# Patient Record
Sex: Female | Born: 1945 | Race: White | Hispanic: No | Marital: Single | Smoking: Never smoker
Health system: Southern US, Community
[De-identification: ages and names within clinical notes are randomized; demographics above are authoritative.]

## PROBLEM LIST (undated history)

## (undated) DIAGNOSIS — F0390 Unspecified dementia without behavioral disturbance: Secondary | ICD-10-CM

## (undated) DIAGNOSIS — I10 Essential (primary) hypertension: Secondary | ICD-10-CM

## (undated) HISTORY — PX: NO PAST SURGERIES: SHX2092

---

## 2009-08-15 ENCOUNTER — Emergency Department (HOSPITAL_COMMUNITY): Admission: EM | Admit: 2009-08-15 | Discharge: 2009-08-15 | Payer: Self-pay | Admitting: Emergency Medicine

## 2010-07-20 LAB — CBC
Hemoglobin: 14.3 g/dL (ref 12.0–15.0)
MCHC: 34.3 g/dL (ref 30.0–36.0)
MCV: 89.1 fL (ref 78.0–100.0)
RBC: 4.67 MIL/uL (ref 3.87–5.11)
RDW: 14.2 % (ref 11.5–15.5)
WBC: 6.5 10*3/uL (ref 4.0–10.5)

## 2010-07-20 LAB — DIFFERENTIAL
Basophils Absolute: 0.1 10*3/uL (ref 0.0–0.1)
Basophils Relative: 1 % (ref 0–1)
Eosinophils Absolute: 0.3 10*3/uL (ref 0.0–0.7)
Lymphocytes Relative: 29 % (ref 12–46)
Monocytes Absolute: 0.6 10*3/uL (ref 0.1–1.0)
Neutrophils Relative %: 56 % (ref 43–77)

## 2010-07-20 LAB — BASIC METABOLIC PANEL
CO2: 26 mEq/L (ref 19–32)
GFR calc Af Amer: >60 mL/min (ref >60)
GFR calc non Af Amer: >60 mL/min (ref >60)
Glucose, Bld: 90 mg/dL (ref 70–99)
Sodium: 137 mEq/L (ref 135–145)

## 2010-07-20 LAB — CK: Total CK: 143 U/L (ref 7–177)

## 2010-07-20 LAB — D-DIMER, QUANTITATIVE: D-Dimer, Quant: 0.44 ug/mL-FEU (ref 0.00–0.48)

## 2013-01-14 ENCOUNTER — Encounter (HOSPITAL_COMMUNITY): Payer: Self-pay | Admitting: Emergency Medicine

## 2013-01-14 DIAGNOSIS — R0609 Other forms of dyspnea: Secondary | ICD-10-CM | POA: Diagnosis not present

## 2013-01-14 DIAGNOSIS — R61 Generalized hyperhidrosis: Secondary | ICD-10-CM | POA: Diagnosis not present

## 2013-01-14 DIAGNOSIS — R209 Unspecified disturbances of skin sensation: Secondary | ICD-10-CM | POA: Insufficient documentation

## 2013-01-14 DIAGNOSIS — R0989 Other specified symptoms and signs involving the circulatory and respiratory systems: Secondary | ICD-10-CM | POA: Insufficient documentation

## 2013-01-14 NOTE — ED Notes (Signed)
Pt c/o left arm and neck tingling x 2-3 days.

## 2013-01-15 ENCOUNTER — Emergency Department (HOSPITAL_COMMUNITY)
Admission: EM | Admit: 2013-01-15 | Discharge: 2013-01-15 | Disposition: A | Discharge status: Home / Self Care | Payer: Medicare Other | Attending: Emergency Medicine | Admitting: Emergency Medicine

## 2013-01-15 ENCOUNTER — Emergency Department (HOSPITAL_COMMUNITY): Payer: Medicare Other

## 2013-01-15 DIAGNOSIS — R200 Anesthesia of skin: Secondary | ICD-10-CM

## 2013-01-15 DIAGNOSIS — R209 Unspecified disturbances of skin sensation: Secondary | ICD-10-CM | POA: Diagnosis not present

## 2013-01-15 LAB — CBC WITH DIFFERENTIAL/PLATELET
Basophils Relative: 1 % (ref 0–1)
Eosinophils Absolute: 0.4 10*3/uL (ref 0.0–0.7)
HCT: 44.6 % (ref 36.0–46.0)
Lymphocytes Relative: 30 % (ref 12–46)
MCHC: 33.6 g/dL (ref 30.0–36.0)
Neutro Abs: 4.4 10*3/uL (ref 1.7–7.7)
Neutrophils Relative %: 55 % (ref 43–77)
Platelets: 272 10*3/uL (ref 150–400)

## 2013-01-15 LAB — BASIC METABOLIC PANEL
CO2: 25 mEq/L (ref 19–32)
Chloride: 102 mEq/L (ref 96–112)
Creatinine, Ser: 0.87 mg/dL (ref 0.50–1.10)
GFR calc Af Amer: 79 mL/min — ABNORMAL LOW (ref >90)
Glucose, Bld: 110 mg/dL — ABNORMAL HIGH (ref 70–99)
Sodium: 139 mEq/L (ref 135–145)

## 2013-01-15 LAB — TROPONIN I
Troponin I: <0.3 ng/mL (ref <0.30)
Troponin I: <0.3 ng/mL (ref <0.30)

## 2013-01-15 MED ORDER — ASPIRIN 81 MG PO CHEW
324.0000 mg | CHEWABLE_TABLET | Freq: Once | ORAL | Status: AC
  Administered 2013-01-15: 324 mg via ORAL
  Filled 2013-01-15: qty 4

## 2013-01-15 MED ORDER — NITROGLYCERIN 0.4 MG SL SUBL: 0.4000 mg | SUBLINGUAL_TABLET | SUBLINGUAL | Status: DC | PRN

## 2013-01-15 NOTE — ED Provider Notes (Signed)
CSN: [id number]     Arrival date & time 01/14/13  2305 History   First MD Initiated Contact with Patient 01/15/13 0228     Chief Complaint  Patient presents with  . Tingling   (Consider location/radiation/quality/duration/timing/severity/associated sxs/prior Treatment) The history is provided by the patient.   67 year old female has had 3 episodes of tingling in her arms and neck with associated dyspnea and diaphoresis. The first 2 episodes woke her up during the night last night and the night before. The third episode occurred while she was at work but was sitting and not physically active. All episodes lasted 10 minutes or less. There has been some very mild residual numbness in her hands but all other symptoms resolved completely. The episode this afternoon seemed to resolve after she took a dose of aspirin. When present, she did not notice anything that seemed to make it worse and nothing seemed to make it better other than perhaps the aspirin. She denies nausea or vomiting. There is some mild tightness or pressure feeling in her left anterior chest at the same time that she was having these symptoms. She is a nonsmoker and denies history of hypertension or diabetes or hyperlipidemia. There is a positive family history of coronary artery disease in her father had MIs starting in his 28s.  History reviewed. No pertinent past medical history. History reviewed. No pertinent past surgical history. History reviewed. No pertinent family history. History  Substance Use Topics  . Smoking status: Never Smoker   . Smokeless tobacco: Not on file  . Alcohol Use: No   OB History   Grav Para Term Preterm Abortions TAB SAB Ect Mult Living                 Review of Systems  All other systems reviewed and are negative.    Allergies  Review of patient's allergies indicates no known allergies.  Home Medications  No current outpatient prescriptions on file. BP 159/72  Pulse 65  Temp(Src) 97.8 F  (36.6 C)  Resp 21  Ht 5' 1.5" (1.562 m)  Wt 140 lb (63.504 kg)  BMI 26.03 kg/m2  SpO2 98% Physical Exam  Nursing note and vitals reviewed.  67 year old female, resting comfortably and in no acute distress. Vital signs are significant for hypertension with blood pressure 159/72, and mild tachypnea with respiratory rate of 21. Oxygen saturation is 98%, which is normal. Head is normocephalic and atraumatic. PERRLA, EOMI. Oropharynx is clear. Neck is nontender and supple without adenopathy or JVD. Back is nontender and there is no CVA tenderness. Lungs are clear without rales, wheezes, or rhonchi. Chest is nontender. Heart has regular rate and rhythm without murmur. Abdomen is soft, flat, nontender without masses or hepatosplenomegaly and peristalsis is normoactive. Extremities have no cyanosis or edema, full range of motion is present. Skin is warm and dry without rash. Neurologic: Mental status is normal, cranial nerves are intact, there are no motor or sensory deficits. There is no pronator drift and no extinction on double simultaneous stimulation.  ED Course  Procedures (including critical care time) Labs Review Results for orders placed during the hospital encounter of 01/15/13  CBC WITH DIFFERENTIAL      Result Value Range   WBC 7.9  4.0 - 10.5 K/uL   RBC 4.99  3.87 - 5.11 MIL/uL   Hemoglobin 15.0  12.0 - 15.0 g/dL   HCT 13.0  86.5 - 78.4 %   MCV 89.4  78.0 - 100.0 fL  MCH 30.1  26.0 - 34.0 pg   MCHC 33.6  30.0 - 36.0 g/dL   RDW 86.5  78.4 - 69.6 %   Platelets 272  150 - 400 K/uL   Neutrophils Relative % 55  43 - 77 %   Neutro Abs 4.4  1.7 - 7.7 K/uL   Lymphocytes Relative 30  12 - 46 %   Lymphs Abs 2.4  0.7 - 4.0 K/uL   Monocytes Relative 9  3 - 12 %   Monocytes Absolute 0.7  0.1 - 1.0 K/uL   Eosinophils Relative 5  0 - 5 %   Eosinophils Absolute 0.4  0.0 - 0.7 K/uL   Basophils Relative 1  0 - 1 %   Basophils Absolute 0.1  0.0 - 0.1 K/uL  BASIC METABOLIC PANEL       Result Value Range   Sodium 139  135 - 145 mEq/L   Potassium 4.0  3.5 - 5.1 mEq/L   Chloride 102  96 - 112 mEq/L   CO2 25  19 - 32 mEq/L   Glucose, Bld 110 (*) 70 - 99 mg/dL   BUN 14  6 - 23 mg/dL   Creatinine, Ser 2.95  0.50 - 1.10 mg/dL   Calcium 28.4 (*) 8.4 - 10.5 mg/dL   GFR calc non Af Amer 68 (*) >90 mL/min   GFR calc Af Amer 79 (*) >90 mL/min  TROPONIN I      Result Value Range   Troponin I <0.30  <0.30 ng/mL  TROPONIN I      Result Value Range   Troponin I <0.30  <0.30 ng/mL   Dg Chest 2 View  01/15/2013   *RADIOLOGY REPORT*  Clinical Data: Tingling  CHEST - 2 VIEW  Comparison: Prior radiograph 08/15/2009  Findings: Cardiac and mediastinal silhouettes are stable in size and contour.  The transverse heart size is at the upper limits of normal.  Lungs are normally inflated.  No focal airspace consolidation, pleural effusion, or pulmonary edema is identified.  There is no pneumothorax.  Multilevel degenerative change and no within the visualized spine. No acute osseous abnormality.  Prominent atherosclerotic calcifications noted within the intra-abdominal aorta.  IMPRESSION: No active cardiopulmonary process.   Original Report Authenticated By: Rise Mu, M.D.    Imaging Review Dg Chest 2 View  01/15/2013   *RADIOLOGY REPORT*  Clinical Data: Tingling  CHEST - 2 VIEW  Comparison: Prior radiograph 08/15/2009  Findings: Cardiac and mediastinal silhouettes are stable in size and contour.  The transverse heart size is at the upper limits of normal.  Lungs are normally inflated.  No focal airspace consolidation, pleural effusion, or pulmonary edema is identified.  There is no pneumothorax.  Multilevel degenerative change and no within the visualized spine. No acute osseous abnormality.  Prominent atherosclerotic calcifications noted within the intra-abdominal aorta.  IMPRESSION: No active cardiopulmonary process.   Original Report Authenticated By: Rise Mu, M.D.      Date: 01/15/2013  Rate: 60  Rhythm: normal sinus rhythm  QRS Axis: normal  Intervals: normal  ST/T Wave abnormalities: normal  Conduction Disutrbances:none  Narrative Interpretation: Left ventricular hypertrophy. When compared with ECG of 08/15/2009, no significant changes are seen.  Old EKG Reviewed: unchanged   MDM   1. Arm numbness    Arm numbness which may be an angina equivalent. Cardiac evaluation will be initiated. There no objective neurologic signs. She is started on aspirin and will likely need some kind of provocative stress testing  to further evaluate her symptoms. Old records are reviewed and there no relevant past visits.  She's remained pain-free in the ED. 2 troponin tests are negative. She will need some form of stress testing, but this can be arranged as an outpatient. She is discharged with instructions to take aspirin daily and prescription for nitroglycerin and she is referred to [facility].  Dione Booze, MD 01/15/13 [phone]

## 2013-01-20 ENCOUNTER — Emergency Department (HOSPITAL_COMMUNITY): Payer: Medicare Other

## 2013-01-20 ENCOUNTER — Emergency Department (HOSPITAL_COMMUNITY)
Admission: EM | Admit: 2013-01-20 | Discharge: 2013-01-20 | Disposition: A | Discharge status: Home / Self Care | Payer: Medicare Other | Attending: Emergency Medicine | Admitting: Emergency Medicine

## 2013-01-20 ENCOUNTER — Encounter (HOSPITAL_COMMUNITY): Payer: Self-pay | Admitting: *Deleted

## 2013-01-20 DIAGNOSIS — Z7982 Long term (current) use of aspirin: Secondary | ICD-10-CM | POA: Diagnosis not present

## 2013-01-20 DIAGNOSIS — R072 Precordial pain: Secondary | ICD-10-CM | POA: Diagnosis present

## 2013-01-20 DIAGNOSIS — R079 Chest pain, unspecified: Secondary | ICD-10-CM

## 2013-01-20 LAB — CBC WITH DIFFERENTIAL/PLATELET
Basophils Absolute: 0 10*3/uL (ref 0.0–0.1)
Lymphocytes Relative: 28 % (ref 12–46)
Lymphs Abs: 2.3 10*3/uL (ref 0.7–4.0)
MCH: 29.8 pg (ref 26.0–34.0)
MCHC: 33.4 g/dL (ref 30.0–36.0)
Neutro Abs: 4.5 10*3/uL (ref 1.7–7.7)
RDW: 13.5 % (ref 11.5–15.5)
WBC: 8.3 10*3/uL (ref 4.0–10.5)

## 2013-01-20 LAB — URINALYSIS, ROUTINE W REFLEX MICROSCOPIC
Glucose, UA: NEGATIVE mg/dL
Ketones, ur: NEGATIVE mg/dL
Nitrite: NEGATIVE
Specific Gravity, Urine: <1.005 — ABNORMAL LOW (ref 1.005–1.030)
pH: 6 (ref 5.0–8.0)

## 2013-01-20 LAB — COMPREHENSIVE METABOLIC PANEL
ALT: 15 U/L (ref 0–35)
AST: 15 U/L (ref 0–37)
Albumin: 3.7 g/dL (ref 3.5–5.2)
BUN: 17 mg/dL (ref 6–23)
Calcium: 9.8 mg/dL (ref 8.4–10.5)
Chloride: 103 mEq/L (ref 96–112)
GFR calc non Af Amer: 51 mL/min — ABNORMAL LOW (ref >90)
Glucose, Bld: 101 mg/dL — ABNORMAL HIGH (ref 70–99)
Total Protein: 7.3 g/dL (ref 6.0–8.3)

## 2013-01-20 LAB — URINE MICROSCOPIC-ADD ON

## 2013-01-20 LAB — D-DIMER, QUANTITATIVE: D-Dimer, Quant: 0.41 ug/mL-FEU (ref 0.00–0.48)

## 2013-01-20 MED ORDER — SODIUM CHLORIDE 0.9 % IV BOLUS (SEPSIS)
1000.0000 mL | Freq: Once | INTRAVENOUS | Status: AC
  Administered 2013-01-20: 1000 mL via INTRAVENOUS

## 2013-01-20 NOTE — ED Notes (Signed)
Pt c/o intermittent centralized chest pain along with SOB and tingling in left arm since Monday. Pt was seen here Monday and diagnosed with angina. Pt states symptoms have "come and gone" throughout the week but today is the worst she's felt since symptoms began Monday.

## 2013-01-20 NOTE — ED Notes (Signed)
Pt alert & oriented x4, stable gait. Patient given discharge instructions, paperwork & prescription(s). Patient  instructed to stop at the registration desk to finish any additional paperwork. Patient verbalized understanding. Pt left department w/ no further questions. 

## 2013-01-20 NOTE — ED Provider Notes (Signed)
CSN: [id number]     Arrival date & time 01/20/13  1607 History   First MD Initiated Contact with Patient 01/20/13 1637     Chief Complaint  Patient presents with  . Chest Pain   (Consider location/radiation/quality/duration/timing/severity/associated sxs/prior Treatment) Patient is a 67 y.o. female presenting with chest pain. The history is provided by the patient (the pt complains of some chest pressure today).  Chest Pain Pain location:  Substernal area Pain quality: aching   Pain radiates to:  Does not radiate Pain radiates to the back: no   Pain severity:  Moderate Onset quality:  Sudden Timing:  Intermittent Progression:  Resolved Chronicity:  Recurrent Context: breathing   Associated symptoms: no abdominal pain, no back pain, no cough, no fatigue and no headache     History reviewed. No pertinent past medical history. History reviewed. No pertinent past surgical history. No family history on file. History  Substance Use Topics  . Smoking status: Never Smoker   . Smokeless tobacco: Not on file  . Alcohol Use: No   OB History   Grav Para Term Preterm Abortions TAB SAB Ect Mult Living                 Review of Systems  Constitutional: Negative for appetite change and fatigue.  HENT: Negative for congestion, sinus pressure and ear discharge.   Eyes: Negative for discharge.  Respiratory: Negative for cough.   Cardiovascular: Positive for chest pain.  Gastrointestinal: Negative for abdominal pain and diarrhea.  Genitourinary: Negative for frequency and hematuria.  Musculoskeletal: Negative for back pain.  Skin: Negative for rash.  Neurological: Negative for seizures and headaches.  Psychiatric/Behavioral: Negative for hallucinations.    Allergies  Review of patient's allergies indicates no known allergies.  Home Medications   Current Outpatient Rx  Name  Route  Sig  Dispense  Refill  . aspirin EC 81 MG tablet   Oral   Take 81 mg by mouth daily.          . nitroGLYCERIN (NITROSTAT) 0.4 MG SL tablet   Sublingual   Place 1 tablet (0.4 mg total) under the tongue every 5 (five) minutes as needed for chest pain.   30 tablet   0    BP 139/99  Pulse 74  Temp(Src) 98.1 F (36.7 C) (Oral)  Resp 19  Ht 5' 1.5" (1.562 m)  Wt 140 lb (63.504 kg)  BMI 26.03 kg/m2  SpO2 96% Physical Exam  Constitutional: She is oriented to person, place, and time. She appears well-developed.  HENT:  Head: Normocephalic.  Eyes: Conjunctivae and EOM are normal. No scleral icterus.  Neck: Neck supple. No thyromegaly present.  Cardiovascular: Normal rate and regular rhythm.  Exam reveals no gallop and no friction rub.   No murmur heard. Pulmonary/Chest: No stridor. She has no wheezes. She has no rales. She exhibits no tenderness.  Abdominal: She exhibits no distension. There is no tenderness. There is no rebound.  Musculoskeletal: Normal range of motion. She exhibits no edema.  Lymphadenopathy:    She has no cervical adenopathy.  Neurological: She is oriented to person, place, and time. Coordination normal.  Skin: No rash noted. No erythema.  Psychiatric: She has a normal mood and affect. Her behavior is normal.    ED Course  Procedures (including critical care time) Labs Review Labs Reviewed  CBC WITH DIFFERENTIAL - Abnormal; Notable for the following:    Eosinophils Relative 8 (*)    All other components within normal  limits  COMPREHENSIVE METABOLIC PANEL - Abnormal; Notable for the following:    Glucose, Bld 101 (*)    Creatinine, Ser 1.11 (*)    GFR calc non Af Amer 51 (*)    GFR calc Af Amer 59 (*)    All other components within normal limits  URINALYSIS, ROUTINE W REFLEX MICROSCOPIC - Abnormal; Notable for the following:    Specific Gravity, Urine <1.005 (*)    Leukocytes, UA SMALL (*)    All other components within normal limits  URINE MICROSCOPIC-ADD ON - Abnormal; Notable for the following:    Squamous Epithelial / LPF MANY (*)     Bacteria, UA MANY (*)    All other components within normal limits  TROPONIN I  D-DIMER, QUANTITATIVE  TROPONIN I   Imaging Review Dg Chest 2 View  01/20/2013   CLINICAL DATA:  Chest pain and shortness of breath  EXAM: CHEST  2 VIEW  COMPARISON:  January 15, 2013  FINDINGS: There is no edema or consolidation. Heart is upper normal in size with normal pulmonary vascularity. No adenopathy. No bone lesions. There is atherosclerotic change in the aorta. There is degenerative change in the thoracic spine.  IMPRESSION: No edema or consolidation.   Electronically Signed   By: Bretta Bang   On: 01/20/2013 17:22    Date: 01/20/2013  Rate: 68  Rhythm: normal sinus rhythm  QRS Axis: normal  Intervals: normal  ST/T Wave abnormalities: normal  Conduction Disutrbances:none  Narrative Interpretation:   Old EKG Reviewed: none available   MDM   1. Chest pain        Benny Lennert, MD 01/20/13 2059

## 2013-01-20 NOTE — ED Notes (Signed)
Pt c/o left side chest feeling "funny" with radiation to left neck area and left arm. Funny feeling is associated with sob, dizziness, was seen in er earlier in the week for same type symptoms, has appointment with cardiologist Wednesday.

## 2013-01-23 ENCOUNTER — Encounter: Payer: Self-pay | Admitting: Cardiology

## 2013-01-23 ENCOUNTER — Encounter: Payer: Self-pay | Admitting: *Deleted

## 2013-01-23 ENCOUNTER — Ambulatory Visit (INDEPENDENT_AMBULATORY_CARE_PROVIDER_SITE_OTHER): Payer: Medicare Other | Admitting: Cardiology

## 2013-01-23 VITALS — BP 176/93 | HR 76 | Ht 61.5 in | Wt 150.0 lb

## 2013-01-23 DIAGNOSIS — R739 Hyperglycemia, unspecified: Secondary | ICD-10-CM

## 2013-01-23 DIAGNOSIS — R079 Chest pain, unspecified: Secondary | ICD-10-CM

## 2013-01-23 DIAGNOSIS — R7309 Other abnormal glucose: Secondary | ICD-10-CM

## 2013-01-23 DIAGNOSIS — E782 Mixed hyperlipidemia: Secondary | ICD-10-CM

## 2013-01-23 NOTE — Patient Instructions (Addendum)
Your physician recommends that you schedule a follow-up appointment in: 3 WEEKS  Your physician recommends that you return for lab work in: THIS WEEK, YOU DO NEED TO BE FASTING, NOTHING TO EAT OR DRINK AFTER MIDNIGHT EXCEPT FOR A FEW SIPS OF WATER WITH YOUR MEDICATIONS (FLP,A1C)  Your physician has requested that you have an echocardiogram. Echocardiography is a painless test that uses sound waves to create images of your heart. It provides your doctor with information about the size and shape of your heart and how well your heart's chambers and valves are working. This procedure takes approximately one hour. There are no restrictions for this procedure.  Your physician has requested that you have a stress echocardiogram. For further information please visit [email or web address] Please follow instruction sheet as given.  WE WILL CALL YOU WITH YOUR TEST RESULTS/INSTRUCTIONS/NEXT STEPS ONCE RECEIVED BY THE PROVIDER

## 2013-01-23 NOTE — Progress Notes (Signed)
   Clinical Summary Charlotte Lane is a 67 y.o.female  1. Dyspnea - seen in ER 01/15/13 w/ episodes of dyspnea, diaphoresis, and tingling down arms and neck. Mild chest pressure. In ER no ischemic EKG changes, troponins negative. Came back to ER 01/20/13 w/ same symptoms. Troponins once again negative  - episodes started earlier this month. Tightness in chest 3/10 in midchest. + fatigue, some nausea, some dizziness. + SOB. W/ some tingling in both arm and hands. Episodes last approx 5 minutes. Can be worst w/ position. Can occur at rest or with exertion. Notes some increased DOE w/ her daily activiities, though she is fairly sedentary. Uses 3 pillows at home for GERD. Occas LE edema, no PND. Notes increasing in frequency over the last 2 weeks    PMH No diagnosed medical problems, but has not seen doctor in 8 years  No Known Allergies   Current Outpatient Prescriptions  Medication Sig Dispense Refill  . aspirin EC 81 MG tablet Take 81 mg by mouth daily.      . nitroGLYCERIN (NITROSTAT) 0.4 MG SL tablet Place 1 tablet (0.4 mg total) under the tongue every 5 (five) minutes as needed for chest pain.  30 tablet  0   No current facility-administered medications for this visit.     No past surgical history on file.   No Known Allergies    Family History Father w/ "heart troubles", unsure what type and age.   Social History Ms. Pottenger reports that she has never smoked. She does not have any smokeless tobacco history on file. Ms. Westry reports that she does not drink alcohol.   Review of Systems 12 point ROS negative other than reported in HPI  Physical Examination p 76 bp 176/93 Gen: resting comfortably, NAD HEENT: no scleral icterus, pupils equal round and reactive, no palptable cervical adenopathy CV: RRR, no m/r/g, no JVD, no carotid bruit Pulm: CTAB Abd: soft, NT, ND NABS, no hepatosplenomegaly Ext: warm, no edema.  Skin: warm, no rash Neuro: A&Ox3, no focal  deficits    Diagnostic Studies 01/20/13 CXR FINDINGS: There is no edema or consolidation. Heart is upper normal in size with normal pulmonary vascularity. No adenopathy. No bone lesions. There is atherosclerotic change in the aorta. There is degenerative change in the thoracic spine.  IMPRESSION: No edema or consolidation.    Pertinent labs 01/20/13: Na 139 K 4.0 Cl 102 BUN 14 Cr 0.87 Ca 10.6 Hgb 15 Hct 44.6 Plt 272 D-Dimer 0.41 (neg)  Assessment and Plan  1. Chest pain: mixed symptoms, some elements are concerning for possible angina. She has not seen doctors in several years, so all of her CAD risk factors are not well described. - obtain echocardiogram - obtain stress echo - check llipid panel, HgbA1c  2. Elevated bp - elevated bp in clinic, patient has not routinely had a health care provider, I suspect she does have HTN - will f/u bp at next visit, if continues to be elevated will initiate therapy   We also provided her a list of PCPs in the area.   Antoine Poche, M.D., F.A.C.C.

## 2013-01-25 LAB — LIPID PANEL
Cholesterol: 197 mg/dL (ref 0–200)
LDL Cholesterol: 131 mg/dL — ABNORMAL HIGH (ref 0–99)
Triglycerides: 102 mg/dL (ref <150)
VLDL: 20 mg/dL (ref 0–40)

## 2013-01-25 LAB — HEMOGLOBIN A1C: Hgb A1c MFr Bld: 6.4 % — ABNORMAL HIGH (ref <5.7)

## 2013-02-01 ENCOUNTER — Encounter: Payer: Self-pay | Admitting: *Deleted

## 2013-02-07 ENCOUNTER — Ambulatory Visit (HOSPITAL_COMMUNITY)
Admission: RE | Admit: 2013-02-07 | Discharge: 2013-02-07 | Disposition: A | Discharge status: Home / Self Care | Payer: Medicare Other | Source: Ambulatory Visit | Attending: Cardiology | Admitting: Cardiology

## 2013-02-07 ENCOUNTER — Encounter (HOSPITAL_COMMUNITY): Payer: Self-pay

## 2013-02-07 DIAGNOSIS — E782 Mixed hyperlipidemia: Secondary | ICD-10-CM

## 2013-02-07 DIAGNOSIS — R739 Hyperglycemia, unspecified: Secondary | ICD-10-CM

## 2013-02-07 DIAGNOSIS — I517 Cardiomegaly: Secondary | ICD-10-CM

## 2013-02-07 DIAGNOSIS — R079 Chest pain, unspecified: Secondary | ICD-10-CM | POA: Insufficient documentation

## 2013-02-07 DIAGNOSIS — R0989 Other specified symptoms and signs involving the circulatory and respiratory systems: Secondary | ICD-10-CM | POA: Insufficient documentation

## 2013-02-07 DIAGNOSIS — R072 Precordial pain: Secondary | ICD-10-CM

## 2013-02-07 DIAGNOSIS — R0609 Other forms of dyspnea: Secondary | ICD-10-CM | POA: Insufficient documentation

## 2013-02-07 NOTE — Progress Notes (Signed)
[facility]   2D echo completed 02/07/2013.   Cindy Zeric Baranowski, RDCS  

## 2013-02-07 NOTE — Progress Notes (Signed)
2 D stress echocardiogram completed. Veda Canning, RDCS

## 2013-02-07 NOTE — Progress Notes (Signed)
Stress Lab Nurses Notes - Charlotte Lane  Charlotte Lane 02/07/2013 Reason for doing test: Chest Pain Type of test: Stress Echo Nurse performing test: Parke Poisson, RN Nuclear Medicine Tech: Not Applicable Echo Tech: Veda Canning MD performing test: Dr. Wyline Mood / Joni Reining NP Family MD: NPCP Test explained and consent signed: yes IV started: No IV started Symptoms: SOB Treatment/Intervention: None Reason test stopped: reached target HR After recovery IV was: NA Patient to return to Nuc. Med at : NA Patient discharged: Home Patient's Condition upon discharge was: stable Comments: During test peak BP 175/41 & HR 157.  Recovery BP 163/78 & HR 82.  Symptoms resolved in recovery. Erskine Speed T

## 2013-03-06 ENCOUNTER — Ambulatory Visit: Payer: Medicare Other | Admitting: Cardiology

## 2013-05-03 ENCOUNTER — Encounter: Payer: Self-pay | Admitting: *Deleted

## 2015-06-24 ENCOUNTER — Encounter (HOSPITAL_COMMUNITY): Payer: Self-pay | Admitting: *Deleted

## 2015-06-24 ENCOUNTER — Emergency Department (HOSPITAL_COMMUNITY): Payer: Medicare Other

## 2015-06-24 DIAGNOSIS — Z8249 Family history of ischemic heart disease and other diseases of the circulatory system: Secondary | ICD-10-CM | POA: Insufficient documentation

## 2015-06-24 DIAGNOSIS — R079 Chest pain, unspecified: Principal | ICD-10-CM | POA: Insufficient documentation

## 2015-06-24 DIAGNOSIS — I10 Essential (primary) hypertension: Secondary | ICD-10-CM | POA: Diagnosis not present

## 2015-06-24 LAB — BASIC METABOLIC PANEL
Anion gap: 10 (ref 5–15)
BUN: 14 mg/dL (ref 6–20)
CHLORIDE: 107 mmol/L (ref 101–111)
CO2: 26 mmol/L (ref 22–32)
CREATININE: 1.35 mg/dL — AB (ref 0.44–1.00)
Calcium: 9.3 mg/dL (ref 8.9–10.3)
GFR calc Af Amer: 45 mL/min — ABNORMAL LOW (ref >60)
GFR, EST NON AFRICAN AMERICAN: 39 mL/min — AB (ref >60)
Glucose, Bld: 118 mg/dL — ABNORMAL HIGH (ref 65–99)
Potassium: 4.2 mmol/L (ref 3.5–5.1)
SODIUM: 143 mmol/L (ref 135–145)

## 2015-06-24 LAB — CBC
HEMATOCRIT: 41.6 % (ref 36.0–46.0)
HEMOGLOBIN: 13.3 g/dL (ref 12.0–15.0)
MCH: 28.9 pg (ref 26.0–34.0)
MCHC: 32 g/dL (ref 30.0–36.0)
MCV: 90.4 fL (ref 78.0–100.0)
Platelets: 291 10*3/uL (ref 150–400)
RBC: 4.6 MIL/uL (ref 3.87–5.11)
RDW: 13.9 % (ref 11.5–15.5)
WBC: 8.1 10*3/uL (ref 4.0–10.5)

## 2015-06-24 NOTE — ED Notes (Signed)
Pt c/o int chest pain x 3 days. Radiates to neck.

## 2015-06-25 ENCOUNTER — Observation Stay (HOSPITAL_COMMUNITY)
Admission: EM | Admit: 2015-06-25 | Discharge: 2015-06-25 | Disposition: A | Discharge status: Home / Self Care | Payer: Medicare Other | Attending: Internal Medicine | Admitting: Internal Medicine

## 2015-06-25 ENCOUNTER — Encounter (HOSPITAL_COMMUNITY): Payer: Self-pay | Admitting: Physician Assistant

## 2015-06-25 ENCOUNTER — Observation Stay (HOSPITAL_COMMUNITY): Payer: Medicare Other

## 2015-06-25 DIAGNOSIS — R072 Precordial pain: Secondary | ICD-10-CM | POA: Diagnosis not present

## 2015-06-25 DIAGNOSIS — I10 Essential (primary) hypertension: Secondary | ICD-10-CM | POA: Diagnosis present

## 2015-06-25 DIAGNOSIS — R079 Chest pain, unspecified: Secondary | ICD-10-CM | POA: Diagnosis not present

## 2015-06-25 HISTORY — DX: Essential (primary) hypertension: I10

## 2015-06-25 LAB — LIPID PANEL
Cholesterol: 252 mg/dL — ABNORMAL HIGH (ref 0–200)
HDL: 48 mg/dL (ref >40)
LDL CALC: 189 mg/dL — AB (ref 0–99)
Total CHOL/HDL Ratio: 5.3 RATIO
Triglycerides: 75 mg/dL (ref <150)
VLDL: 15 mg/dL (ref 0–40)

## 2015-06-25 LAB — NM MYOCAR MULTI W/SPECT W/WALL MOTION / EF
CHL CUP RESTING HR STRESS: 75 {beats}/min
CSEPPHR: 86 {beats}/min
MPHR: 151 {beats}/min
Percent HR: 56 %

## 2015-06-25 LAB — TROPONIN I: Troponin I: <0.03 ng/mL (ref <0.031)

## 2015-06-25 MED ORDER — ASPIRIN 81 MG PO CHEW: 81.0000 mg | CHEWABLE_TABLET | Freq: Every day | ORAL | Status: DC

## 2015-06-25 MED ORDER — ASPIRIN 325 MG PO TABS
325.0000 mg | ORAL_TABLET | Freq: Once | ORAL | Status: AC
  Administered 2015-06-25: 325 mg via ORAL
  Filled 2015-06-25: qty 1

## 2015-06-25 MED ORDER — ENOXAPARIN SODIUM 40 MG/0.4ML ~~LOC~~ SOLN
40.0000 mg | SUBCUTANEOUS | Status: DC
  Filled 2015-06-25: qty 0.4

## 2015-06-25 MED ORDER — AMLODIPINE BESYLATE 5 MG PO TABS: 5.0000 mg | ORAL_TABLET | Freq: Every day | ORAL | Status: DC

## 2015-06-25 MED ORDER — ONDANSETRON HCL 4 MG/2ML IJ SOLN: 4.0000 mg | Freq: Four times a day (QID) | INTRAMUSCULAR | Status: DC | PRN

## 2015-06-25 MED ORDER — ACETAMINOPHEN 325 MG PO TABS: 650.0000 mg | ORAL_TABLET | ORAL | Status: DC | PRN

## 2015-06-25 MED ORDER — HYDROCHLOROTHIAZIDE 12.5 MG PO CAPS: 12.5000 mg | ORAL_CAPSULE | Freq: Every day | ORAL | Status: DC

## 2015-06-25 MED ORDER — TECHNETIUM TC 99M SESTAMIBI GENERIC - CARDIOLITE: 10.0000 | Freq: Once | INTRAVENOUS | Status: DC | PRN

## 2015-06-25 MED ORDER — REGADENOSON 0.4 MG/5ML IV SOLN
0.4000 mg | Freq: Once | INTRAVENOUS | Status: AC
  Administered 2015-06-25: 0.4 mg via INTRAVENOUS
  Filled 2015-06-25: qty 5

## 2015-06-25 MED ORDER — REGADENOSON 0.4 MG/5ML IV SOLN
INTRAVENOUS | Status: AC
  Filled 2015-06-25: qty 5

## 2015-06-25 NOTE — Consult Note (Addendum)
Cardiologist: Branch Reason for Consult: Chest pain Referring Physician: Jadia Capers is an 70 y.o. female.  HPI:               Patient is a 70 year old female with a history of hypertension.  She was seen by Dr. Harl Bowie in September 2014 for chest pain.  She had no ischemic changes on her EKG at that time.  She had a normal stress echocardiogram.  Left atrium was mildly dilated ejection fraction was 60-65% with normal wall motion.             She presents today with chest pain.  EKG shows inferior T-wave inversion. Chest x-ray shows borderline cardiomegaly. Initial troponin is negative creatinine 1.35. Patient reports an aching feeling in her left arm for the last 2-3 days which then started radiating up into her neck into her chest. Chest she described as a tight feeling and was 3-4 out of 10 in intensity. There was a little shortness of breath but no diaphoresis, nausea, vomiting.  She also denies fever, chest pain, orthopnea, dizziness, PND, cough, congestion, abdominal pain, hematochezia, melena, lower extremity edema, claudication.  She and her partner in the process of moving and doing a lot of lifting.  Exertion may exacerbate the chest tightness.       Past Medical History   Diagnosis  Date   .  Hypertension       History reviewed. No pertinent past surgical history.    Family History   Problem  Relation  Age of Onset   .  Heart attack  Father       Social History:  reports that she has never smoked. She does not have any smokeless tobacco history on file. She reports that she does not drink alcohol or use illicit drugs.  Allergies: No Known Allergies  Medications:   Prior to Admission medications    Medication  Sig  Start Date  End Date  Taking?  Authorizing Provider   nitroGLYCERIN (NITROSTAT) 0.4 MG SL tablet  Place 1 tablet (0.4 mg total) under the tongue every 5 (five) minutes as needed for chest pain.  5/59/74    Yes  Delora Fuel, MD         Lab Results  Last 48 Hours    Results for orders placed or performed during the hospital encounter of 06/25/15 (from the past 48 hour(s))   Basic metabolic panel     Status: Abnormal     Collection Time: 06/24/15  9:15 PM   Result  Value  Ref Range     Sodium  143  135 - 145 mmol/L     Potassium  4.2  3.5 - 5.1 mmol/L     Chloride  107  101 - 111 mmol/L     CO2  26  22 - 32 mmol/L     Glucose, Bld  118 (H)  65 - 99 mg/dL     BUN  14  6 - 20 mg/dL     Creatinine, Ser  1.35 (H)  0.44 - 1.00 mg/dL     Calcium  9.3  8.9 - 10.3 mg/dL     GFR calc non Af Amer  39 (L)  >60 mL/min     GFR calc Af Amer  45 (L)  >60 mL/min       Comment:  (NOTE)  The eGFR has been calculated using the CKD EPI equation. This calculation has not been validated in all  clinical situations. eGFR's persistently <60 mL/min signify possible Chronic Kidney Disease.      Anion gap  10  5 - 15   CBC     Status: None     Collection Time: 06/24/15  9:15 PM   Result  Value  Ref Range     WBC  8.1  4.0 - 10.5 K/uL     RBC  4.60  3.87 - 5.11 MIL/uL     Hemoglobin  13.3  12.0 - 15.0 g/dL     HCT  41.6  36.0 - 46.0 %     MCV  90.4  78.0 - 100.0 fL     MCH  28.9  26.0 - 34.0 pg     MCHC  32.0  30.0 - 36.0 g/dL     RDW  13.9  11.5 - 15.5 %     Platelets  291  150 - 400 K/uL   Troponin I     Status: None     Collection Time: 06/25/15  3:45 AM   Result  Value  Ref Range     Troponin I  <0.03  <0.031 ng/mL       Comment:           NO INDICATION OF MYOCARDIAL INJURY.    Troponin I (q 6hr x 3)     Status: None     Collection Time: 06/25/15  6:25 AM   Result  Value  Ref Range     Troponin I  <0.03  <0.031 ng/mL       Comment:           NO INDICATION OF MYOCARDIAL INJURY.         Imaging Results (Last 48 hours)    Dg Chest 2 View  06/24/2015  CLINICAL DATA:  Acute onset of left-sided chest pain and left neck pain. Left arm pain. Initial encounter. EXAM: CHEST  2 VIEW COMPARISON:  Chest radiograph from 01/20/2013 FINDINGS: The  lungs are well-aerated. Pulmonary vascularity is at the upper limits of normal. A calcified granuloma is noted at the left lung base. There is no evidence of focal opacification, pleural effusion or pneumothorax. The heart is borderline enlarged. No acute osseous abnormalities are seen. Diffuse calcification is noted along the abdominal aorta. IMPRESSION: Borderline cardiomegaly.  Lungs remain grossly clear. Electronically Signed   By: Garald Balding M.D.   On: 06/24/2015 21:22      Review of Systems  Constitutional: Negative for fever and diaphoresis.  HENT: Negative for congestion and sore throat.   Respiratory: Positive for shortness of breath. Negative for cough.   Cardiovascular: Positive for chest pain. Negative for palpitations, orthopnea, leg swelling and PND.  Gastrointestinal: Negative for nausea, vomiting, abdominal pain, blood in stool and melena.  Genitourinary: Negative for hematuria.  Musculoskeletal: Positive for myalgias ( left arm) and neck pain.  Neurological: Negative for dizziness.  All other systems reviewed and are negative.  Blood pressure 169/62, pulse 60, temperature 98.1 F (36.7 C), temperature source Oral, resp. rate 17, SpO2 99 %. Physical Exam  Nursing note and vitals reviewed. Constitutional: She is oriented to person, place, and time. She appears well-developed and well-nourished. No distress.  HENT:   Head: Normocephalic and atraumatic.   Mouth/Throat: No oropharyngeal exudate.  Eyes: Conjunctivae and EOM are normal. Pupils are equal, round, and reactive to light. No scleral icterus.  Neck: Normal range of motion. Neck supple. No JVD present.  Cardiovascular: Normal rate, regular rhythm, S1  normal and S2 normal.  Exam reveals gallop and S3.    No murmur heard. Pulses:      Radial pulses are 2+ on the right side, and 2+ on the left side.       Dorsalis pedis pulses are 2+ on the right side, and 2+ on the left side.  Respiratory: Effort normal and breath  sounds normal. She has no wheezes. She has no rales.  GI: Soft. Bowel sounds are normal. She exhibits no distension. There is no tenderness.  Musculoskeletal: She exhibits no edema.  Lymphadenopathy:    She has no cervical adenopathy.  Neurological: She is alert and oriented to person, place, and time.  Skin: Skin is warm and dry.  Psychiatric: She has a normal mood and affect.    Assessment/Plan: Principal Problem:   Chest pain Active Problems:   HTN (hypertension)   Chest pain with moderate risk for cardiac etiology  The patient's chest pain sounds typical with tightness, radiation to her neck and left arm although the sequence of events seem to start in the arm first and then go backwards to her chest.  She has T-wave inversion in inferior leads.  Troponin is negative. Currently pain-free. I'll set her up for a Lexiscan Cardiolite today. She probably can walk on a treadmill however, she has not slept in 2 days so I prefer to do the [place].  She does not follow a primary care doctor regularly and needs better blood pressure control.  Dr. Alcario Drought is planning to start amlodipine.    HAGER, BRYAN, PA-C 06/25/2015, 7:09 AM    Pt. Seen and examined. Agree with the NP/PA-C note as written.  Atypical chest pain. Mild EKG changes. Stress test was low risk with normal LV function. Ok to d/c home from our standpoint. Will need to establish with PCP for management of hypertension.  Pixie Casino, MD, [facility] Attending Cardiologist [place]

## 2015-06-25 NOTE — H&P (Signed)
[facility] History and Physical  CHEKESHA Lane [id number] DOB: 17-Jan-1946 DOA: 06/25/2015  Referring physician: EDP PCP: No PCP Per Patient   Chief Complaint: Chest pain   HPI: Charlotte Lane is a 70 y.o. female with h/o HTN but dosent follow with a doctor so not on any treatment.  Patient presents to the ED with c/o achy, intermittent chest pain that onset yesterday.  Patient was watching TV during onset of symptoms.  Aching pain that radiates to LUE and L neck.  Arm pain has been going on for about 2 weeks but the neck pain is new over the past couple of days.  Nothing seems to make pain better or worse, no chest pain currently.  Review of Systems: Systems reviewed.  As above, otherwise negative  Past Medical History  Diagnosis Date  . Hypertension    History reviewed. No pertinent past surgical history. Social History:  reports that she has never smoked. She does not have any smokeless tobacco history on file. She reports that she does not drink alcohol or use illicit drugs.  No Known Allergies  No family history on file. Family history of MI but not early onset  Prior to Admission medications   Medication Sig Start Date End Date Taking? Authorizing Provider  nitroGLYCERIN (NITROSTAT) 0.4 MG SL tablet Place 1 tablet (0.4 mg total) under the tongue every 5 (five) minutes as needed for chest pain. [id number]  Yes Delora Fuel, MD   Physical Exam: Filed Vitals:   06/25/15 0500 06/25/15 0515  BP:    Pulse:    Temp:    Resp: 19 17    BP 169/62 mmHg  Pulse 60  Temp(Src) 98.1 F (36.7 C) (Oral)  Resp 17  SpO2 99%  General Appearance:    Alert, oriented, no distress, appears stated age  Head:    Normocephalic, atraumatic  Eyes:    PERRL, EOMI, sclera non-icteric        Nose:   Nares without drainage or epistaxis. Mucosa, turbinates normal  Throat:   Moist mucous membranes. Oropharynx without erythema or exudate.  Neck:   Supple. No carotid bruits.  No  thyromegaly.  No lymphadenopathy.   Back:     No CVA tenderness, no spinal tenderness  Lungs:     Clear to auscultation bilaterally, without wheezes, rhonchi or rales  Chest wall:    No tenderness to palpitation  Heart:    Regular rate and rhythm without murmurs, gallops, rubs  Abdomen:     Soft, non-tender, nondistended, normal bowel sounds, no organomegaly  Genitalia:    deferred  Rectal:    deferred  Extremities:   No clubbing, cyanosis or edema.  Pulses:   2+ and symmetric all extremities  Skin:   Skin color, texture, turgor normal, no rashes or lesions  Lymph nodes:   Cervical, supraclavicular, and axillary nodes normal  Neurologic:   CNII-XII intact. Normal strength, sensation and reflexes      throughout    Labs on Admission:  Basic Metabolic Panel:  Recent Labs Lab 06/24/15 2115  NA 143  K 4.2  CL 107  CO2 26  GLUCOSE 118*  BUN 14  CREATININE 1.35*  CALCIUM 9.3   Liver Function Tests: No results for input(s): AST, ALT, ALKPHOS, BILITOT, PROT, ALBUMIN in the last 168 hours. No results for input(s): LIPASE, AMYLASE in the last 168 hours. No results for input(s): AMMONIA in the last 168 hours. CBC:  Recent Labs Lab 06/24/15 2115  WBC 8.1  HGB 13.3  HCT 41.6  MCV 90.4  PLT 291   Cardiac Enzymes:  Recent Labs Lab 06/25/15 0345  TROPONINI <0.03    BNP (last 3 results) No results for input(s): PROBNP in the last 8760 hours. CBG: No results for input(s): GLUCAP in the last 168 hours.  Radiological Exams on Admission: Dg Chest 2 View  06/24/2015  CLINICAL DATA:  Acute onset of left-sided chest pain and left neck pain. Left arm pain. Initial encounter. EXAM: CHEST  2 VIEW COMPARISON:  Chest radiograph from 01/20/2013 FINDINGS: The lungs are well-aerated. Pulmonary vascularity is at the upper limits of normal. A calcified granuloma is noted at the left lung base. There is no evidence of focal opacification, pleural effusion or pneumothorax. The heart is  borderline enlarged. No acute osseous abnormalities are seen. Diffuse calcification is noted along the abdominal aorta. IMPRESSION: Borderline cardiomegaly.  Lungs remain grossly clear. Electronically Signed   By: Garald Balding M.D.   On: 06/24/2015 21:22    EKG: Independently reviewed.  Assessment/Plan Principal Problem:   Chest pain Active Problems:   HTN (hypertension)   Chest pain with moderate risk for cardiac etiology   1. Chest pain - 1. Last stress test was in 2014 and was negative at that time 2. Despite having HTN she hasnt been on any meds 3. Chest pain obs pathway 4. Serial trops 5. Lipid panel 6. A1C 7. NPO for possible stress test 2. HTN - 1. Creatinine 1.35 (likely baseline) so would like to avoid ACEi, ARB, or diuretic as first choice. 2. Plan to start amlodipine on discharge after her likely stress test today.     Code Status: Full  Family Communication: No family in room Disposition Plan: Admit to obs   Time spent: 70 min  GARDNER, JARED M. [facility] Pager [phone]  If 7AM-7PM, please contact the day team taking care of the patient [email or web address] Password [facility] 06/25/2015, 5:52 AM

## 2015-06-25 NOTE — Progress Notes (Signed)
     The patient was seen in nuclear medicine for a lexiscan myoview. She tolerated the procedure well. No acute ST or TW changes on ECG. Await nuclear images.    Shin Lamour Stern PA-C  [facility]     

## 2015-06-25 NOTE — ED Notes (Signed)
Pt verbalized understanding of d/c instructions, prescriptions, and follow-up care. No further questions/concerns, VSS, ambulatory w/ steady gait (refused wheelchair) 

## 2015-06-25 NOTE — ED Notes (Signed)
Denies CP right now

## 2015-06-25 NOTE — ED Notes (Signed)
Patient transported to [facility] 

## 2015-06-25 NOTE — ED Notes (Signed)
Paged Dr. Hartford Poli to [place] RN

## 2015-06-25 NOTE — Discharge Summary (Signed)
Physician Discharge Summary  Charlotte Lane [id number] DOB: Sep 22, 1945 DOA: 06/25/2015  PCP: No PCP Per Patient  Admit date: 06/25/2015 Discharge date: 06/25/2015  Time spent: 40 minutes  Recommendations for Outpatient Follow-up:  1. Follow-up with primary care physician within R week.   Discharge Diagnoses:  Principal Problem:   Chest pain Active Problems:   HTN (hypertension)   Chest pain with moderate risk for cardiac etiology   Discharge Condition: Stable  Diet recommendation: Heart healthy  There were no vitals filed for this visit.  History of present illness:  Charlotte Lane is a 70 y.o. female with h/o HTN but dosent follow with a doctor so not on any treatment. Patient presents to the ED with c/o achy, intermittent chest pain that onset yesterday. Patient was watching TV during onset of symptoms. Aching pain that radiates to LUE and L neck. Arm pain has been going on for about 2 weeks but the neck pain is new over the past couple of days. Nothing seems to make pain better or worse, no chest pain currently.  Hospital Course:   1. Chest pain  1. Last stress test was in 2014 and was negative at that time 2. Despite having HTN she hasnt been on any meds 3. 2 sets of troponin were negative, 12-lead EKG negative for ischemic changes. 4. Cardiology consulted, Lexiscan Myoview test done earlier today and showed no reversible ischemia. 5. Chest there is likely secondary to muscle skeletal pain. 2. HTN - 1. Creatinine 1.35 (likely baseline) so would like to avoid ACEi, ARB, or diuretic as first choice. 2.  discharged on amlodipine 5 mg.   Procedures: Lexiscan Myoview, please see results below.  Consultations: None  Discharge Exam: Filed Vitals:   06/25/15 1219 06/25/15 1335  BP: 148/93 170/81  Pulse: 64 65  Temp:    Resp: 17 19  General: Alert and awake, oriented x3, not in any acute distress. HEENT: anicteric sclera, pupils reactive to light and  accommodation, EOMI CVS: S1-S2 clear, no murmur rubs or gallops Chest: clear to auscultation bilaterally, no wheezing, rales or rhonchi Abdomen: soft nontender, nondistended, normal bowel sounds, no organomegaly Extremities: no cyanosis, clubbing or edema noted bilaterally Neuro: Cranial nerves II-XII intact, no focal neurological deficits  Discharge Instructions   Discharge Instructions    Diet - low sodium heart healthy    Complete by:  As directed      Increase activity slowly    Complete by:  As directed           Current Discharge Medication List    START taking these medications   Details  amLODipine (NORVASC) 5 MG tablet Take 1 tablet (5 mg total) by mouth daily. Qty: 30 tablet, Refills: 0    aspirin 81 MG chewable tablet Chew 1 tablet (81 mg total) by mouth daily.      STOP taking these medications     nitroGLYCERIN (NITROSTAT) 0.4 MG SL tablet        No Known Allergies    The results of significant diagnostics from this hospitalization (including imaging, microbiology, ancillary and laboratory) are listed below for reference.    Significant Diagnostic Studies: Dg Chest 2 View  06/24/2015  CLINICAL DATA:  Acute onset of left-sided chest pain and left neck pain. Left arm pain. Initial encounter. EXAM: CHEST  2 VIEW COMPARISON:  Chest radiograph from 01/20/2013 FINDINGS: The lungs are well-aerated. Pulmonary vascularity is at the upper limits of normal. A calcified granuloma is noted at  the left lung base. There is no evidence of focal opacification, pleural effusion or pneumothorax. The heart is borderline enlarged. No acute osseous abnormalities are seen. Diffuse calcification is noted along the abdominal aorta. IMPRESSION: Borderline cardiomegaly.  Lungs remain grossly clear. Electronically Signed   By: Garald Balding M.D.   On: 06/24/2015 21:22   Nm Myocar Multi W/spect W/wall Motion / Ef  06/25/2015  CLINICAL DATA:  Left shoulder, neck, chest pain for 2-3 days.  Hypertension. Shortness of breath. EXAM: MYOCARDIAL IMAGING WITH SPECT (REST AND PHARMACOLOGIC-STRESS) GATED LEFT VENTRICULAR WALL MOTION STUDY LEFT VENTRICULAR EJECTION FRACTION TECHNIQUE: Standard myocardial SPECT imaging was performed after resting intravenous injection of 10 mCi Tc-29m sestamibi. Subsequently, intravenous infusion of Lexiscan was performed under the supervision of the Cardiology staff. At peak effect of the drug, 30 mCi Tc-60m sestamibi was injected intravenously and standard myocardial SPECT imaging was performed. Quantitative gated imaging was also performed to evaluate left ventricular wall motion, and estimate left ventricular ejection fraction. COMPARISON:  Chest radiograph from 06/24/2015 FINDINGS: Perfusion: No inducible ischemia. Reduced activity in the inferior wall on the rest images is attributed to diaphragmatic attenuation and/or breast size. No definite scarring. Wall Motion: Normal left ventricular wall motion. No left ventricular dilation. Left Ventricular Ejection Fraction: 76 % End diastolic volume 60 ml End systolic volume 15 ml IMPRESSION: 1. No reversible ischemia or infarction. 2. Normal left ventricular wall motion. 3. Left ventricular ejection fraction 76% 4. Low-risk stress test findings*. *2012 Appropriate Use Criteria for Coronary Revascularization Focused Update: J Am Coll Cardiol. N6492421. http://content.airportbarriers.com.aspx?articleid=1201161 Electronically Signed   By: Van Clines M.D.   On: 06/25/2015 12:43    Microbiology: No results found for this or any previous visit (from the past 240 hour(s)).   Labs: Basic Metabolic Panel:  Recent Labs Lab 06/24/15 2115  NA 143  K 4.2  CL 107  CO2 26  GLUCOSE 118*  BUN 14  CREATININE 1.35*  CALCIUM 9.3   Liver Function Tests: No results for input(s): AST, ALT, ALKPHOS, BILITOT, PROT, ALBUMIN in the last 168 hours. No results for input(s): LIPASE, AMYLASE in the last 168  hours. No results for input(s): AMMONIA in the last 168 hours. CBC:  Recent Labs Lab 06/24/15 2115  WBC 8.1  HGB 13.3  HCT 41.6  MCV 90.4  PLT 291   Cardiac Enzymes:  Recent Labs Lab 06/25/15 0345 06/25/15 0625 06/25/15 1229  TROPONINI <0.03 <0.03 <0.03   BNP: BNP (last 3 results) No results for input(s): BNP in the last 8760 hours.  ProBNP (last 3 results) No results for input(s): PROBNP in the last 8760 hours.  CBG: No results for input(s): GLUCAP in the last 168 hours.     Signed:  Birdie Hopes MD.  [facility] 06/25/2015, 1:45 PM

## 2015-06-25 NOTE — ED Provider Notes (Signed)
CSN: [id number]     Arrival date & time 06/24/15  2027 History  By signing my name below, I, Evelene Croon, attest that this documentation has been prepared under the direction and in the presence of Jola Schmidt, MD . Electronically Signed: Evelene Croon, Scribe. 06/25/2015. 3:40 AM.     Chief Complaint  Patient presents with  . Chest Pain    The history is provided by the patient. No language interpreter was used.     HPI Comments:  Charlotte Lane is a 70 y.o. female with a history of HTN who presents to the Emergency Department complaining of achy intermittent CP that began yesterday. Pt states she was watching TV when her symptom started. She reports associated aching pain in her LUE with radiation of pain to her left neck. She has been experiencing the arm pain for ~ 2 weeks but the neck pain has been present x a few days. No alleviating factors noted. She denies h/o DM, h/o MI and smoking hx. She has a family hx of MI.   Past Medical History  Diagnosis Date  . Hypertension    History reviewed. No pertinent past surgical history. No family history on file. Social History  Substance Use Topics  . Smoking status: Never Smoker   . Smokeless tobacco: None  . Alcohol Use: No   OB History    No data available     Review of Systems  10 systems reviewed and all are negative for acute change except as noted in the HPI.  Allergies  Review of patient's allergies indicates no known allergies.  Home Medications   Prior to Admission medications   Medication Sig Start Date End Date Taking? Authorizing Provider  nitroGLYCERIN (NITROSTAT) 0.4 MG SL tablet Place 1 tablet (0.4 mg total) under the tongue every 5 (five) minutes as needed for chest pain. [id number]  Yes Delora Fuel, MD   BP 123456 mmHg  Pulse 61  Temp(Src) 98.1 F (36.7 C) (Oral)  Resp 22  SpO2 99% Physical Exam  Constitutional: She is oriented to person, place, and time. She appears well-developed and  well-nourished. No distress.  HENT:  Head: Normocephalic and atraumatic.  Eyes: EOM are normal.  Neck: Normal range of motion.  Cardiovascular: Normal rate, regular rhythm and normal heart sounds.   Pulmonary/Chest: Effort normal and breath sounds normal.  Abdominal: Soft. She exhibits no distension. There is no tenderness.  Musculoskeletal: Normal range of motion.  Neurological: She is alert and oriented to person, place, and time.  Skin: Skin is warm and dry.  Psychiatric: She has a normal mood and affect. Judgment normal.  Nursing note and vitals reviewed.   ED Course  Procedures   DIAGNOSTIC STUDIES:  Oxygen Saturation is 99% on RA, normal by my interpretation.    COORDINATION OF CARE:  3:30 AM Pt informed of need for hospital admission for observation.  Discussed treatment plan with pt at bedside and pt agreed to plan.  Labs Review Labs Reviewed  BASIC METABOLIC PANEL - Abnormal; Notable for the following:    Glucose, Bld 118 (*)    Creatinine, Ser 1.35 (*)    GFR calc non Af Amer 39 (*)    GFR calc Af Amer 45 (*)    All other components within normal limits  CBC  TROPONIN I    Imaging Review Dg Chest 2 View  06/24/2015  CLINICAL DATA:  Acute onset of left-sided chest pain and left neck pain. Left arm pain. Initial  encounter. EXAM: CHEST  2 VIEW COMPARISON:  Chest radiograph from 01/20/2013 FINDINGS: The lungs are well-aerated. Pulmonary vascularity is at the upper limits of normal. A calcified granuloma is noted at the left lung base. There is no evidence of focal opacification, pleural effusion or pneumothorax. The heart is borderline enlarged. No acute osseous abnormalities are seen. Diffuse calcification is noted along the abdominal aorta. IMPRESSION: Borderline cardiomegaly.  Lungs remain grossly clear. Electronically Signed   By: Garald Balding M.D.   On: 06/24/2015 21:22   I have personally reviewed and evaluated these images and lab results as part of my medical  decision-making.   EKG Interpretation   Date/Time:  Wednesday June 24 2015 20:45:21 EST Ventricular Rate:  62 PR Interval:  180 QRS Duration: 70 QT Interval:  420 QTC Calculation: 426 R Axis:   19 Text Interpretation:  Normal sinus rhythm Minimal voltage criteria for  LVH, may be normal variant Cannot rule out Anterior infarct , age  undetermined Abnormal ECG No significant change was found Confirmed by  Denai Caba  MD, Reece Fehnel ([id number]) on 06/25/2015 3:31:21 AM      MDM   Final diagnoses:  Chest pain, unspecified chest pain type    Patiently admitted to the hospital for ongoing workup of her chest pain.  Her workup 2 years ago was with a nondiagnostic stress test.  Stress echo was normal that time.  I personally performed the services described in this documentation, which was scribed in my presence. The recorded information has been reviewed and is accurate.       Jola Schmidt, MD 06/25/15 [phone]

## 2015-06-25 NOTE — Consult Note (Deleted)
Cardiologist: Branch Reason for Consult: Chest pain Referring Physician: Gayatri Teasdale is an 70 y.o. female.  HPI:   Patient is a 70 year old female with a history of hypertension.  She was seen by Dr. Harl Bowie in September 2014 for chest pain.  She had no ischemic changes on her EKG at that time.  She had a normal stress echocardiogram.  Left atrium was mildly dilated ejection fraction was 60-65% with normal wall motion.  She presents today with chest pain.  EKG shows inferior T-wave inversion. Chest x-ray shows borderline cardiomegaly. Initial troponin is negative creatinine 1.35. Patient reports an aching feeling in her left arm for the last 2-3 days which then started radiating up into her neck into her chest. Chest she described as a tight feeling and was 3-4 out of 10 in intensity. There was a little shortness of breath but no diaphoresis, nausea, vomiting.  She also denies fever, chest pain, orthopnea, dizziness, PND, cough, congestion, abdominal pain, hematochezia, melena, lower extremity edema, claudication.  She and her partner in the process of moving and doing a lot of lifting.  Exertion may exacerbate the chest tightness.     Past Medical History  Diagnosis Date  . Hypertension     History reviewed. No pertinent past surgical history.  Family History  Problem Relation Age of Onset  . Heart attack Father     Social History:  reports that she has never smoked. She does not have any smokeless tobacco history on file. She reports that she does not drink alcohol or use illicit drugs.  Allergies: No Known Allergies  Medications:  Prior to Admission medications   Medication Sig Start Date End Date Taking? Authorizing Provider  nitroGLYCERIN (NITROSTAT) 0.4 MG SL tablet Place 1 tablet (0.4 mg total) under the tongue every 5 (five) minutes as needed for chest pain. 08/17/38  Yes Delora Fuel, MD     Results for orders placed or performed during the hospital encounter  of 06/25/15 (from the past 48 hour(s))  Basic metabolic panel     Status: Abnormal   Collection Time: 06/24/15  9:15 PM  Result Value Ref Range   Sodium 143 135 - 145 mmol/L   Potassium 4.2 3.5 - 5.1 mmol/L   Chloride 107 101 - 111 mmol/L   CO2 26 22 - 32 mmol/L   Glucose, Bld 118 (H) 65 - 99 mg/dL   BUN 14 6 - 20 mg/dL   Creatinine, Ser 1.35 (H) 0.44 - 1.00 mg/dL   Calcium 9.3 8.9 - 10.3 mg/dL   GFR calc non Af Amer 39 (L) >60 mL/min   GFR calc Af Amer 45 (L) >60 mL/min    Comment: (NOTE) The eGFR has been calculated using the CKD EPI equation. This calculation has not been validated in all clinical situations. eGFR's persistently <60 mL/min signify possible Chronic Kidney Disease.    Anion gap 10 5 - 15  CBC     Status: None   Collection Time: 06/24/15  9:15 PM  Result Value Ref Range   WBC 8.1 4.0 - 10.5 K/uL   RBC 4.60 3.87 - 5.11 MIL/uL   Hemoglobin 13.3 12.0 - 15.0 g/dL   HCT 41.6 36.0 - 46.0 %   MCV 90.4 78.0 - 100.0 fL   MCH 28.9 26.0 - 34.0 pg   MCHC 32.0 30.0 - 36.0 g/dL   RDW 13.9 11.5 - 15.5 %   Platelets 291 150 - 400 K/uL  Troponin I  Status: None   Collection Time: 06/25/15  3:45 AM  Result Value Ref Range   Troponin I <0.03 <0.031 ng/mL    Comment:        NO INDICATION OF MYOCARDIAL INJURY.   Troponin I (q 6hr x 3)     Status: None   Collection Time: 06/25/15  6:25 AM  Result Value Ref Range   Troponin I <0.03 <0.031 ng/mL    Comment:        NO INDICATION OF MYOCARDIAL INJURY.     Dg Chest 2 View  06/24/2015  CLINICAL DATA:  Acute onset of left-sided chest pain and left neck pain. Left arm pain. Initial encounter. EXAM: CHEST  2 VIEW COMPARISON:  Chest radiograph from 01/20/2013 FINDINGS: The lungs are well-aerated. Pulmonary vascularity is at the upper limits of normal. A calcified granuloma is noted at the left lung base. There is no evidence of focal opacification, pleural effusion or pneumothorax. The heart is borderline enlarged. No acute  osseous abnormalities are seen. Diffuse calcification is noted along the abdominal aorta. IMPRESSION: Borderline cardiomegaly.  Lungs remain grossly clear. Electronically Signed   By: Garald Balding M.D.   On: 06/24/2015 21:22    Review of Systems  Constitutional: Negative for fever and diaphoresis.  HENT: Negative for congestion and sore throat.   Respiratory: Positive for shortness of breath. Negative for cough.   Cardiovascular: Positive for chest pain. Negative for palpitations, orthopnea, leg swelling and PND.  Gastrointestinal: Negative for nausea, vomiting, abdominal pain, blood in stool and melena.  Genitourinary: Negative for hematuria.  Musculoskeletal: Positive for myalgias ( left arm) and neck pain.  Neurological: Negative for dizziness.  All other systems reviewed and are negative.  Blood pressure 169/62, pulse 60, temperature 98.1 F (36.7 C), temperature source Oral, resp. rate 17, SpO2 99 %. Physical Exam  Nursing note and vitals reviewed. Constitutional: She is oriented to person, place, and time. She appears well-developed and well-nourished. No distress.  HENT:  Head: Normocephalic and atraumatic.  Mouth/Throat: No oropharyngeal exudate.  Eyes: Conjunctivae and EOM are normal. Pupils are equal, round, and reactive to light. No scleral icterus.  Neck: Normal range of motion. Neck supple. No JVD present.  Cardiovascular: Normal rate, regular rhythm, S1 normal and S2 normal.  Exam reveals gallop and S3.   No murmur heard. Pulses:      Radial pulses are 2+ on the right side, and 2+ on the left side.       Dorsalis pedis pulses are 2+ on the right side, and 2+ on the left side.  Respiratory: Effort normal and breath sounds normal. She has no wheezes. She has no rales.  GI: Soft. Bowel sounds are normal. She exhibits no distension. There is no tenderness.  Musculoskeletal: She exhibits no edema.  Lymphadenopathy:    She has no cervical adenopathy.  Neurological: She is  alert and oriented to person, place, and time.  Skin: Skin is warm and dry.  Psychiatric: She has a normal mood and affect.    Assessment/Plan: Principal Problem:   Chest pain Active Problems:   HTN (hypertension)   Chest pain with moderate risk for cardiac etiology  The patient's chest pain sounds typical with tightness, radiation to her neck and left arm although the sequence of events seem to start in the arm first and then go backwards to her chest.  She has T-wave inversion in inferior leads.  Troponin is negative. Currently pain-free. I'll set her up for a [employer]  Cardiolite today. She probably can walk on a treadmill however, she has not slept in 2 days so I prefer to do the [place].  She does not follow a primary care doctor regularly and needs better blood pressure control.  Dr. Alcario Drought is planning to start amlodipine.    HAGER, BRYAN, PA-C 06/25/2015, 7:09 AM

## 2015-06-25 NOTE — ED Notes (Signed)
Call NM when pt has IV  [phone]

## 2015-06-26 LAB — HEMOGLOBIN A1C
Hgb A1c MFr Bld: 6 % — ABNORMAL HIGH (ref 4.8–5.6)
Mean Plasma Glucose: 126 mg/dL

## 2017-03-28 ENCOUNTER — Other Ambulatory Visit (HOSPITAL_COMMUNITY): Payer: Self-pay | Admitting: Family

## 2017-03-28 DIAGNOSIS — I10 Essential (primary) hypertension: Secondary | ICD-10-CM | POA: Diagnosis not present

## 2017-03-28 DIAGNOSIS — Z23 Encounter for immunization: Secondary | ICD-10-CM | POA: Diagnosis not present

## 2017-03-28 DIAGNOSIS — M810 Age-related osteoporosis without current pathological fracture: Secondary | ICD-10-CM | POA: Diagnosis not present

## 2017-03-28 DIAGNOSIS — Z1231 Encounter for screening mammogram for malignant neoplasm of breast: Secondary | ICD-10-CM

## 2017-04-07 ENCOUNTER — Ambulatory Visit (HOSPITAL_COMMUNITY): Payer: Medicare Other

## 2018-01-04 ENCOUNTER — Other Ambulatory Visit (HOSPITAL_COMMUNITY): Payer: Self-pay | Admitting: Family Medicine

## 2018-01-04 DIAGNOSIS — Z1231 Encounter for screening mammogram for malignant neoplasm of breast: Secondary | ICD-10-CM

## 2018-01-18 ENCOUNTER — Other Ambulatory Visit (HOSPITAL_COMMUNITY): Payer: Self-pay | Admitting: Family

## 2018-01-18 DIAGNOSIS — Z1231 Encounter for screening mammogram for malignant neoplasm of breast: Secondary | ICD-10-CM

## 2018-02-01 ENCOUNTER — Ambulatory Visit (HOSPITAL_COMMUNITY): Payer: Medicare Other

## 2018-02-05 ENCOUNTER — Ambulatory Visit (HOSPITAL_COMMUNITY)
Admission: RE | Admit: 2018-02-05 | Discharge: 2018-02-05 | Disposition: A | Discharge status: Home / Self Care | Payer: Medicare Other | Source: Ambulatory Visit | Attending: Family Medicine | Admitting: Family Medicine

## 2018-02-05 ENCOUNTER — Other Ambulatory Visit (HOSPITAL_COMMUNITY): Payer: Self-pay | Admitting: Family Medicine

## 2018-02-05 DIAGNOSIS — Z1231 Encounter for screening mammogram for malignant neoplasm of breast: Secondary | ICD-10-CM | POA: Diagnosis present

## 2018-04-19 ENCOUNTER — Other Ambulatory Visit (HOSPITAL_COMMUNITY): Payer: Self-pay | Admitting: Family Medicine

## 2018-04-19 DIAGNOSIS — M810 Age-related osteoporosis without current pathological fracture: Secondary | ICD-10-CM

## 2018-05-14 ENCOUNTER — Encounter (HOSPITAL_COMMUNITY): Payer: Self-pay

## 2018-05-14 ENCOUNTER — Other Ambulatory Visit (HOSPITAL_COMMUNITY): Payer: Medicare Other

## 2019-01-11 ENCOUNTER — Other Ambulatory Visit (HOSPITAL_COMMUNITY): Payer: Self-pay | Admitting: Family Medicine

## 2019-01-11 DIAGNOSIS — Z1231 Encounter for screening mammogram for malignant neoplasm of breast: Secondary | ICD-10-CM

## 2019-01-11 DIAGNOSIS — Z13820 Encounter for screening for osteoporosis: Secondary | ICD-10-CM

## 2019-01-22 ENCOUNTER — Other Ambulatory Visit (HOSPITAL_COMMUNITY): Payer: Medicare Other

## 2019-02-08 ENCOUNTER — Encounter (HOSPITAL_COMMUNITY): Payer: Self-pay | Admitting: *Deleted

## 2019-02-08 ENCOUNTER — Emergency Department (HOSPITAL_COMMUNITY)
Admission: EM | Admit: 2019-02-08 | Discharge: 2019-02-08 | Disposition: A | Discharge status: Home / Self Care | Payer: Medicare Other | Attending: Emergency Medicine | Admitting: Emergency Medicine

## 2019-02-08 ENCOUNTER — Other Ambulatory Visit: Payer: Self-pay

## 2019-02-08 DIAGNOSIS — Z7982 Long term (current) use of aspirin: Secondary | ICD-10-CM | POA: Insufficient documentation

## 2019-02-08 DIAGNOSIS — L729 Follicular cyst of the skin and subcutaneous tissue, unspecified: Secondary | ICD-10-CM

## 2019-02-08 DIAGNOSIS — Z79899 Other long term (current) drug therapy: Secondary | ICD-10-CM | POA: Insufficient documentation

## 2019-02-08 DIAGNOSIS — I10 Essential (primary) hypertension: Secondary | ICD-10-CM | POA: Insufficient documentation

## 2019-02-08 DIAGNOSIS — R220 Localized swelling, mass and lump, head: Secondary | ICD-10-CM | POA: Diagnosis present

## 2019-02-08 DIAGNOSIS — L720 Epidermal cyst: Secondary | ICD-10-CM | POA: Diagnosis not present

## 2019-02-08 MED ORDER — HYDROCODONE-ACETAMINOPHEN 5-325 MG PO TABS: ORAL_TABLET | ORAL | 0 refills | Status: DC

## 2019-02-08 MED ORDER — DOXYCYCLINE HYCLATE 100 MG PO CAPS: 100.0000 mg | ORAL_CAPSULE | Freq: Two times a day (BID) | ORAL | 0 refills | Status: DC

## 2019-02-08 NOTE — ED Triage Notes (Signed)
C/o knot on side of head for the past year, states she has ringing in her right ear and area is painful intermittently. Staets she has been trying to see a doctor but has had difficulty getting an appointment

## 2019-02-08 NOTE — Discharge Instructions (Addendum)
Warm compresses on and off to the area.  Call the general surgeon listed to arrange a follow-up appointment.

## 2019-02-09 NOTE — ED Provider Notes (Signed)
[facility] EMERGENCY DEPARTMENT Provider Note   CSN: [id number] Arrival date & time: 02/08/19  1036     History   Chief Complaint Chief Complaint  Patient presents with  . Cyst    HPI Charlotte Lane is a 73 y.o. female.     HPI   Charlotte Lane is a 73 y.o. female who presents to the Emergency Department complaining of painful "knot" to the right side of her's posterior scalp.  This is been present for over 1 year.  She states the area is increasing in size and is intermittently painful and causing tinnitus of her right ear.  She states that she is tried to see her PCP, but is unable to get an appointment to see anyone in the office.  She denies fever, chills, drainage of the area, and neck pain.  Nothing makes her symptoms better or worse.   Past Medical History:  Diagnosis Date  . Hypertension     Patient Active Problem List   Diagnosis Date Noted  . HTN (hypertension) 06/25/2015  . Chest pain with moderate risk for cardiac etiology 06/25/2015  . Chest pain 01/23/2013    History reviewed. No pertinent surgical history.   OB History   No obstetric history on file.      Home Medications    Prior to Admission medications   Medication Sig Start Date End Date Taking? Authorizing Provider  amLODipine (NORVASC) 5 MG tablet Take 1 tablet (5 mg total) by mouth daily. 06/25/15   Verlee Monte, MD  aspirin 81 MG chewable tablet Chew 1 tablet (81 mg total) by mouth daily. 06/26/15   Verlee Monte, MD  doxycycline (VIBRAMYCIN) 100 MG capsule Take 1 capsule (100 mg total) by mouth 2 (two) times daily. 02/08/19   Zekiah Coen, PA-C  HYDROcodone-acetaminophen (NORCO/VICODIN) 5-325 MG tablet Take one tab po q 4 hrs prn pain 02/08/19   Jailey Booton, PA-C    Family History Family History  Problem Relation Age of Onset  . Heart attack Father     Social History Social History   Tobacco Use  . Smoking status: Never Smoker  . Smokeless tobacco: Never Used   Substance Use Topics  . Alcohol use: No  . Drug use: No     Allergies   Patient has no known allergies.   Review of Systems Review of Systems  Constitutional: Negative for chills and fever.  Eyes: Negative for visual disturbance.  Cardiovascular: Negative for chest pain.  Gastrointestinal: Negative for nausea and vomiting.  Musculoskeletal: Negative for arthralgias, joint swelling and neck pain.  Skin: Negative for color change.       Mass to right parietall scalp  Hematological: Negative for adenopathy.     Physical Exam Updated Vital Signs BP (!) 157/71   Pulse 66   Temp 97.9 F (36.6 C)   Resp 20   Ht 5\' 2"  (1.575 m)   Wt 66 kg   SpO2 99%   BMI 26.61 kg/m   Physical Exam Vitals signs and nursing note reviewed.  Constitutional:      General: She is not in acute distress.    Appearance: Normal appearance. She is well-developed.  HENT:     Head:     Comments: 2 cm  firm, flesh colored mass to right parietal scalp.  No drainage, erythema.      Right Ear: Tympanic membrane, ear canal and external ear normal.     Left Ear: Tympanic membrane, ear canal and external  ear normal.  Cardiovascular:     Rate and Rhythm: Normal rate and regular rhythm.     Heart sounds: Normal heart sounds. No murmur.  Pulmonary:     Effort: Pulmonary effort is normal. No respiratory distress.     Breath sounds: Normal breath sounds.  Lymphadenopathy:     Head:     Right side of head: No posterior auricular adenopathy.     Left side of head: No posterior auricular adenopathy.     Cervical: No cervical adenopathy.     Right cervical: No posterior cervical adenopathy.    Left cervical: No posterior cervical adenopathy.  Skin:    General: Skin is warm and dry.     Findings: No erythema.  Neurological:     Mental Status: She is alert and oriented to person, place, and time.     Motor: No abnormal muscle tone.     Coordination: Coordination normal.      ED Treatments / Results   Labs (all labs ordered are listed, but only abnormal results are displayed) Labs Reviewed - No data to display  EKG None  Radiology No results found.  Procedures Procedures (including critical care time)  Medications Ordered in ED Medications - No data to display   Initial Impression / Assessment and Plan / ED Course  I have reviewed the triage vital signs and the nursing notes.  Pertinent labs & imaging results that were available during my care of the patient were reviewed by me and considered in my medical decision making (see chart for details).        Pt with firm nodular mass to the scalp.  No surrounding erythema or fluctuance, no surrounding lymphadenopathy. Doubt emergent process.  Sx's are chronic.  Pt agrees to close f/u with general surgery.    Final Clinical Impressions(s) / ED Diagnoses   Final diagnoses:  Scalp cyst    ED Discharge Orders         Ordered    HYDROcodone-acetaminophen (NORCO/VICODIN) 5-325 MG tablet     02/08/19 1237    doxycycline (VIBRAMYCIN) 100 MG capsule  2 times daily     02/08/19 [place], Lynelle Smoke, PA-C 02/09/19 2040    Francine Graven, DO 02/13/19 2348

## 2019-02-11 ENCOUNTER — Ambulatory Visit (HOSPITAL_COMMUNITY)
Admission: RE | Admit: 2019-02-11 | Discharge: 2019-02-11 | Disposition: A | Discharge status: Home / Self Care | Payer: Medicare Other | Source: Ambulatory Visit | Attending: Family Medicine | Admitting: Family Medicine

## 2019-02-11 ENCOUNTER — Other Ambulatory Visit: Payer: Self-pay

## 2019-02-11 DIAGNOSIS — Z1231 Encounter for screening mammogram for malignant neoplasm of breast: Secondary | ICD-10-CM

## 2019-02-11 DIAGNOSIS — Z13820 Encounter for screening for osteoporosis: Secondary | ICD-10-CM | POA: Insufficient documentation

## 2019-02-11 DIAGNOSIS — Z780 Asymptomatic menopausal state: Secondary | ICD-10-CM | POA: Diagnosis not present

## 2019-02-26 ENCOUNTER — Other Ambulatory Visit: Payer: Self-pay

## 2019-02-26 ENCOUNTER — Encounter: Payer: Self-pay | Admitting: General Surgery

## 2019-02-26 ENCOUNTER — Ambulatory Visit (INDEPENDENT_AMBULATORY_CARE_PROVIDER_SITE_OTHER): Payer: Medicare Other | Admitting: General Surgery

## 2019-02-26 VITALS — BP 177/81 | HR 80 | Temp 98.1°F | Resp 16 | Ht 62.0 in | Wt 142.0 lb

## 2019-02-26 DIAGNOSIS — L723 Sebaceous cyst: Secondary | ICD-10-CM | POA: Diagnosis not present

## 2019-02-26 MED ORDER — AMLODIPINE BESYLATE 5 MG PO TABS: 5.0000 mg | ORAL_TABLET | Freq: Every day | ORAL | 0 refills | Status: DC

## 2019-02-26 NOTE — Progress Notes (Signed)
Charlotte Lane; [id number]; 12-03-1945   HPI Patient is a 73 year old white female who was referred to my care by Dr. Luan Pulling for a knot on her scalp.  Is been present for approximately 1 year.  She states that starting to become tender to touch and occasionally causes ringing in her right ear.  No drainage has been noted.  She denies any trauma to the scalp.  She currently has 0 out of 10 pain in that area.  She was recently seen in the emergency room for this and was prescribed an antibiotic, but did not take it. Past Medical History:  Diagnosis Date  . Hypertension     History reviewed. No pertinent surgical history.  Family History  Problem Relation Age of Onset  . Heart attack Father     Current Outpatient Medications on File Prior to Visit  Medication Sig Dispense Refill  . aspirin 81 MG chewable tablet Chew 1 tablet (81 mg total) by mouth daily.    Marland Kitchen doxycycline (VIBRAMYCIN) 100 MG capsule Take 1 capsule (100 mg total) by mouth 2 (two) times daily. (Patient not taking: Reported on 02/26/2019) 20 capsule 0  . HYDROcodone-acetaminophen (NORCO/VICODIN) 5-325 MG tablet Take one tab po q 4 hrs prn pain (Patient not taking: Reported on 02/26/2019) 10 tablet 0   No current facility-administered medications on file prior to visit.     No Known Allergies  Social History   Substance and Sexual Activity  Alcohol Use No    Social History   Tobacco Use  Smoking Status Never Smoker  Smokeless Tobacco Never Used    Review of Systems  Constitutional: Positive for malaise/fatigue.  HENT: Positive for sinus pain.   Eyes: Negative.   Respiratory: Negative.   Cardiovascular: Negative.   Gastrointestinal: Negative.   Genitourinary: Negative.   Musculoskeletal: Negative.   Skin: Negative.   Neurological: Positive for headaches.  Endo/Heme/Allergies: Negative.   Psychiatric/Behavioral: Negative.     Objective   Vitals:   02/26/19 1302  BP: (!) 177/81  Pulse: 80  Resp: 16   Temp: 98.1 F (36.7 C)  SpO2: 96%    Physical Exam Vitals signs reviewed.  Constitutional:      Appearance: Normal appearance. She is not ill-appearing.  HENT:     Head: Normocephalic and atraumatic.  Cardiovascular:     Rate and Rhythm: Normal rate and regular rhythm.     Heart sounds: Normal heart sounds. No murmur. No friction rub. No gallop.   Pulmonary:     Effort: Pulmonary effort is normal. No respiratory distress.     Breath sounds: Normal breath sounds. No stridor. No wheezing, rhonchi or rales.  Skin:    General: Skin is warm and dry.     Comments: Right parietal region with 1.5 to 2 cm lobulated subcutaneous hard mass present.  No fluctuance is present.  No erythema is noted.  It is mobile.  Neurological:     Mental Status: She is alert and oriented to person, place, and time.   ER notes reviewed  Assessment  Sebaceous cyst, scalp Plan   Patient is scheduled for excision of the sebaceous cyst, scalp on 03/06/2019.  The risks and benefits of the procedure including bleeding and infection were fully explained to the patient, who gave informed consent.  I did reorder her amlodipine as she states she did not get it filled.  I encouraged her to take it.

## 2019-02-26 NOTE — Patient Instructions (Signed)
Epidermal Cyst  An epidermal cyst is a small, painless lump under your skin. The cyst contains a grayish-white, bad-smelling substance (keratin). Do not try to pop or open an epidermal cyst yourself. What are the causes?  A blocked hair follicle.  A hair that curls and re-enters the skin instead of growing straight out of the skin.  A blocked pore.  Irritated skin.  An injury to the skin.  Certain conditions that are passed along from parent to child (inherited).  Human papillomavirus (HPV).  Long-term sun damage to the skin. What increases the risk?  Having acne.  Being overweight.  Being 30-40 years old. What are the signs or symptoms? These cysts are usually harmless, but they can get infected. Symptoms of infection may include:  Redness.  Inflammation.  Tenderness.  Warmth.  Fever.  A grayish-white, bad-smelling substance drains from the cyst.  Pus drains from the cyst. How is this treated? In many cases, epidermal cysts go away on their own without treatment. If a cyst becomes infected, treatment may include:  Opening and draining the cyst, done by a doctor. After draining, you may need minor surgery to remove the rest of the cyst.  Antibiotic medicine.  Shots of medicines (steroids) that help to reduce inflammation.  Surgery to remove the cyst. Surgery may be done if the cyst: ? Becomes large. ? Bothers you. ? Has a chance of turning into cancer.  Do not try to open a cyst yourself. Follow these instructions at home:  Take over-the-counter and prescription medicines only as told by your doctor.  If you were prescribed an antibiotic medicine, take it it as told by your doctor. Do not stop using the antibiotic even if you start to feel better.  Keep the area around your cyst clean and dry.  Wear loose, dry clothing.  Avoid touching your cyst.  Check your cyst every day for signs of infection. Check for: ? Redness, swelling, or pain. ? Fluid  or blood. ? Warmth. ? Pus or a bad smell.  Keep all follow-up visits as told by your doctor. This is important. How is this prevented?  Wear clean, dry, clothing.  Avoid wearing tight clothing.  Keep your skin clean and dry. Take showers or baths every day. Contact a doctor if:  Your cyst has symptoms of infection.  Your condition does not improve or gets worse.  You have a cyst that looks different from other cysts you have had.  You have a fever. Get help right away if:  Redness spreads from the cyst into the area close by. Summary  An epidermal cyst is a sac made of skin tissue.  If a cyst becomes infected, treatment may include surgery to open and drain the cyst, or to remove it.  Take over-the-counter and prescription medicines only as told by your doctor.  Contact a doctor if your condition is not improving or is getting worse.  Keep all follow-up visits as told by your doctor. This is important. This information is not intended to replace advice given to you by your health care provider. Make sure you discuss any questions you have with your health care provider. Document Released: 05/26/2004 Document Revised: 08/09/2018 Document Reviewed: 01/25/2018 Elsevier Patient Education  2020 [employer]  

## 2019-02-26 NOTE — H&P (Signed)
Charlotte Lane; [id number]; [birth year]   HPI Patient is a 73 year old white female who was referred to my care by Dr. Luan Pulling for a knot on her scalp.  Is been present for approximately 1 year.  She states that starting to become tender to touch and occasionally causes ringing in her right ear.  No drainage has been noted.  She denies any trauma to the scalp.  She currently has 0 out of 10 pain in that area.  She was recently seen in the emergency room for this and was prescribed an antibiotic, but did not take it. Past Medical History:  Diagnosis Date  . Hypertension     History reviewed. No pertinent surgical history.  Family History  Problem Relation Age of Onset  . Heart attack Father     Current Outpatient Medications on File Prior to Visit  Medication Sig Dispense Refill  . aspirin 81 MG chewable tablet Chew 1 tablet (81 mg total) by mouth daily.    Marland Kitchen doxycycline (VIBRAMYCIN) 100 MG capsule Take 1 capsule (100 mg total) by mouth 2 (two) times daily. (Patient not taking: Reported on 02/26/2019) 20 capsule 0  . HYDROcodone-acetaminophen (NORCO/VICODIN) 5-325 MG tablet Take one tab po q 4 hrs prn pain (Patient not taking: Reported on 02/26/2019) 10 tablet 0   No current facility-administered medications on file prior to visit.     No Known Allergies  Social History   Substance and Sexual Activity  Alcohol Use No    Social History   Tobacco Use  Smoking Status Never Smoker  Smokeless Tobacco Never Used    Review of Systems  Constitutional: Positive for malaise/fatigue.  HENT: Positive for sinus pain.   Eyes: Negative.   Respiratory: Negative.   Cardiovascular: Negative.   Gastrointestinal: Negative.   Genitourinary: Negative.   Musculoskeletal: Negative.   Skin: Negative.   Neurological: Positive for headaches.  Endo/Heme/Allergies: Negative.   Psychiatric/Behavioral: Negative.     Objective   Vitals:   02/26/19 1302  BP: (!) 177/81  Pulse: 80  Resp: 16   Temp: 98.1 F (36.7 C)  SpO2: 96%    Physical Exam Vitals signs reviewed.  Constitutional:      Appearance: Normal appearance. She is not ill-appearing.  HENT:     Head: Normocephalic and atraumatic.  Cardiovascular:     Rate and Rhythm: Normal rate and regular rhythm.     Heart sounds: Normal heart sounds. No murmur. No friction rub. No gallop.   Pulmonary:     Effort: Pulmonary effort is normal. No respiratory distress.     Breath sounds: Normal breath sounds. No stridor. No wheezing, rhonchi or rales.  Skin:    General: Skin is warm and dry.     Comments: Right parietal region with 1.5 to 2 cm lobulated subcutaneous hard mass present.  No fluctuance is present.  No erythema is noted.  It is mobile.  Neurological:     Mental Status: She is alert and oriented to person, place, and time.   ER notes reviewed  Assessment  Sebaceous cyst, scalp Plan   Patient is scheduled for excision of the sebaceous cyst, scalp on 03/06/2019.  The risks and benefits of the procedure including bleeding and infection were fully explained to the patient, who gave informed consent.  I did reorder her amlodipine as she states she did not get it filled.  I encouraged her to take it.

## 2019-03-04 ENCOUNTER — Encounter (HOSPITAL_COMMUNITY): Payer: Self-pay

## 2019-03-04 ENCOUNTER — Encounter (HOSPITAL_COMMUNITY)
Admission: RE | Admit: 2019-03-04 | Discharge: 2019-03-04 | Disposition: A | Discharge status: Home / Self Care | Payer: Medicare Other | Source: Ambulatory Visit | Attending: General Surgery | Admitting: General Surgery

## 2019-03-04 ENCOUNTER — Other Ambulatory Visit: Payer: Self-pay

## 2019-03-04 ENCOUNTER — Other Ambulatory Visit (HOSPITAL_COMMUNITY)
Admission: RE | Admit: 2019-03-04 | Discharge: 2019-03-04 | Disposition: A | Discharge status: Home / Self Care | Payer: Medicare Other | Source: Ambulatory Visit | Attending: General Surgery | Admitting: General Surgery

## 2019-03-04 DIAGNOSIS — Z01818 Encounter for other preprocedural examination: Secondary | ICD-10-CM | POA: Diagnosis present

## 2019-03-04 DIAGNOSIS — L723 Sebaceous cyst: Secondary | ICD-10-CM | POA: Insufficient documentation

## 2019-03-04 DIAGNOSIS — Z20828 Contact with and (suspected) exposure to other viral communicable diseases: Secondary | ICD-10-CM | POA: Diagnosis not present

## 2019-03-04 DIAGNOSIS — Z01812 Encounter for preprocedural laboratory examination: Secondary | ICD-10-CM | POA: Insufficient documentation

## 2019-03-04 DIAGNOSIS — I10 Essential (primary) hypertension: Secondary | ICD-10-CM | POA: Diagnosis not present

## 2019-03-04 LAB — SARS CORONAVIRUS 2 (TAT 6-24 HRS): SARS Coronavirus 2: NEGATIVE

## 2019-03-04 NOTE — Patient Instructions (Signed)
Your procedure is scheduled on: 03/06/2019  Report to [facility] at   7:00  AM.  Call this number if you have problems the morning of surgery: [phone]   Remember:   Do not Eat or Drink after midnight   :  Take these medicines the morning of surgery with A SIP OF WATER: Amlodipine   Do not wear jewelry, make-up or nail polish.  Do not wear lotions, powders, or perfumes. You may wear deodorant.  Do not shave 48 hours prior to surgery. Men may shave face and neck.  Do not bring valuables to the hospital.  Contacts, dentures or bridgework may not be worn into surgery.  Leave suitcase in the car. After surgery it may be brought to your room.  For patients admitted to the hospital, checkout time is 11:00 AM the day of discharge.   Patients discharged the day of surgery will not be allowed to drive home.    Special Instructions: Shower using CHG night before surgery and shower the day of surgery use CHG.  Use special wash - you have one bottle of CHG for all showers.  You should use approximately 1/2 of the bottle for each shower.  Wound Care, Adult Taking care of your wound properly can help to prevent pain, infection, and scarring. It can also help your wound to heal more quickly. How to care for your wound Wound care      Follow instructions from your health care provider about how to take care of your wound. Make sure you: ? Wash your hands with soap and water before you change the bandage (dressing). If soap and water are not available, use hand sanitizer. ? Change your dressing as told by your health care provider. ? Leave stitches (sutures), skin glue, or adhesive strips in place. These skin closures may need to stay in place for 2 weeks or longer. If adhesive strip edges start to loosen and curl up, you may trim the loose edges. Do not remove adhesive strips completely unless your health care provider tells you to do that.  Check your wound area every day for signs of  infection. Check for: ? Redness, swelling, or pain. ? Fluid or blood. ? Warmth. ? Pus or a bad smell.  Ask your health care provider if you should clean the wound with mild soap and water. Doing this may include: ? Using a clean towel to pat the wound dry after cleaning it. Do not rub or scrub the wound. ? Applying a cream or ointment. Do this only as told by your health care provider. ? Covering the incision with a clean dressing.  Ask your health care provider when you can leave the wound uncovered.  Keep the dressing dry until your health care provider says it can be removed. Do not take baths, swim, use a hot tub, or do anything that would put the wound underwater until your health care provider approves. Ask your health care provider if you can take showers. You may only be allowed to take sponge baths. Medicines  Antibiotic even if your condition improves.  Take over-the-counter and prescription medicines only as told by your health care provider. If you were prescribed pain medicine, take it 30 or more minutes before you do any wound care or as told by your health care provider. General instructions  Return to your normal activities as told by your health care provider. Ask your health care provider what activities are safe.  Do not  scratch or pick at the wound.  Do not use any products that contain nicotine or tobacco, such as cigarettes and e-cigarettes. These may delay wound healing. If you need help quitting, ask your health care provider.  Keep all follow-up visits as told by your health care provider. This is important.  Eat a diet that includes protein, vitamin A, vitamin C, and other nutrient-rich foods to help the wound heal. ? Foods rich in protein include meat, dairy, beans, nuts, and other sources. ? Foods rich in vitamin A include carrots and dark green, leafy vegetables. ? Foods rich in vitamin C include citrus, tomatoes, and other fruits and vegetables. ?  Nutrient-rich foods have protein, carbohydrates, fat, vitamins, or minerals. Eat a variety of healthy foods including vegetables, fruits, and whole grains. Contact a health care provider if:  You received a tetanus shot and you have swelling, severe pain, redness, or bleeding at the injection site.  Your pain is not controlled with medicine.  You have redness, swelling, or pain around the wound.  You have fluid or blood coming from the wound.  Your wound feels warm to the touch.  You have pus or a bad smell coming from the wound.  You have a fever or chills.  You are nauseous or you vomit.  You are dizzy. Get help right away if:  You have a red streak going away from your wound.  The edges of the wound open up and separate.  Your wound is bleeding, and the bleeding does not stop with gentle pressure.  You have a rash.  You faint.  You have trouble breathing. Summary  Always wash your hands with soap and water before changing your bandage (dressing).  To help with healing, eat foods that are rich in protein, vitamin A, vitamin C, and other nutrients.  Check your wound every day for signs of infection. Contact your health care provider if you suspect that your wound is infected. This information is not intended to replace advice given to you by your health care provider. Make sure you discuss any questions you have with your health care provider. Document Released: 01/26/2008 Document Revised: 08/06/2018 Document Reviewed: 11/03/2015 Elsevier Patient Education  [place] After These instructions provide you with information about caring for yourself after your procedure. Your health care provider may also give you more specific instructions. Your treatment has been planned according to current medical practices, but problems sometimes occur. Call your health care provider if you have any problems or questions after your procedure.  What can I expect after the procedure? After your procedure, you may:  Feel sleepy for several hours.  Feel clumsy and have poor balance for several hours.  Feel forgetful about what happened after the procedure.  Have poor judgment for several hours.  Feel nauseous or vomit.  Have a sore throat if you had a breathing tube during the procedure. Follow these instructions at home: For at least 24 hours after the procedure:      Have a responsible adult stay with you. It is important to have someone help care for you until you are awake and alert.  Rest as needed.  Do not: ? Participate in activities in which you could fall or become injured. ? Drive. ? Use heavy machinery. ? Drink alcohol. ? Take sleeping pills or medicines that cause drowsiness. ? Make important decisions or sign legal documents. ? Take care of children on your own. Eating and  drinking  Follow the diet that is recommended by your health care provider.  If you vomit, drink water, juice, or soup when you can drink without vomiting.  Make sure you have little or no nausea before eating solid foods. General instructions  Take over-the-counter and prescription medicines only as told by your health care provider.  If you have sleep apnea, surgery and certain medicines can increase your risk for breathing problems. Follow instructions from your health care provider about wearing your sleep device: ? Anytime you are sleeping, including during daytime naps. ? While taking prescription pain medicines, sleeping medicines, or medicines that make you drowsy.  If you smoke, do not smoke without supervision.  Keep all follow-up visits as told by your health care provider. This is important. Contact a health care provider if:  You keep feeling nauseous or you keep vomiting.  You feel light-headed.  You develop a rash.  You have a fever. Get help right away if:  You have trouble breathing. Summary  For  several hours after your procedure, you may feel sleepy and have poor judgment.  Have a responsible adult stay with you for at least 24 hours or until you are awake and alert. This information is not intended to replace advice given to you by your health care provider. Make sure you discuss any questions you have with your health care provider. Document Released: 08/09/2015 Document Revised: 07/17/2017 Document Reviewed: 08/09/2015 Elsevier Patient Education  2020 [employer].

## 2019-03-06 ENCOUNTER — Ambulatory Visit (HOSPITAL_COMMUNITY): Payer: Medicare Other | Admitting: Anesthesiology

## 2019-03-06 ENCOUNTER — Encounter (HOSPITAL_COMMUNITY)
Admission: RE | Disposition: A | Discharge status: Home / Self Care | Payer: Self-pay | Source: Home / Self Care | Attending: General Surgery

## 2019-03-06 ENCOUNTER — Encounter (HOSPITAL_COMMUNITY): Payer: Self-pay

## 2019-03-06 ENCOUNTER — Ambulatory Visit (HOSPITAL_COMMUNITY)
Admission: RE | Admit: 2019-03-06 | Discharge: 2019-03-06 | Disposition: A | Discharge status: Home / Self Care | Payer: Medicare Other | Attending: General Surgery | Admitting: General Surgery

## 2019-03-06 ENCOUNTER — Other Ambulatory Visit: Payer: Self-pay

## 2019-03-06 DIAGNOSIS — D234 Other benign neoplasm of skin of scalp and neck: Secondary | ICD-10-CM | POA: Insufficient documentation

## 2019-03-06 DIAGNOSIS — L723 Sebaceous cyst: Secondary | ICD-10-CM | POA: Diagnosis present

## 2019-03-06 DIAGNOSIS — I10 Essential (primary) hypertension: Secondary | ICD-10-CM | POA: Insufficient documentation

## 2019-03-06 DIAGNOSIS — Z8249 Family history of ischemic heart disease and other diseases of the circulatory system: Secondary | ICD-10-CM | POA: Insufficient documentation

## 2019-03-06 DIAGNOSIS — Z7982 Long term (current) use of aspirin: Secondary | ICD-10-CM | POA: Diagnosis not present

## 2019-03-06 HISTORY — PX: CYST EXCISION: SHX5701

## 2019-03-06 SURGERY — CYST REMOVAL
Anesthesia: Monitor Anesthesia Care | Site: Scalp | Laterality: Right

## 2019-03-06 MED ORDER — HYDROCODONE-ACETAMINOPHEN 7.5-325 MG PO TABS: 1.0000 | ORAL_TABLET | Freq: Once | ORAL | Status: DC | PRN

## 2019-03-06 MED ORDER — BACITRACIN-NEOMYCIN-POLYMYXIN 400-5-5000 EX OINT
TOPICAL_OINTMENT | CUTANEOUS | Status: DC | PRN
  Administered 2019-03-06: 1 via TOPICAL

## 2019-03-06 MED ORDER — BACITRACIN-NEOMYCIN-POLYMYXIN 400-5-5000 EX OINT
TOPICAL_OINTMENT | CUTANEOUS | Status: AC
  Filled 2019-03-06: qty 1

## 2019-03-06 MED ORDER — KETOROLAC TROMETHAMINE 30 MG/ML IJ SOLN
15.0000 mg | Freq: Once | INTRAMUSCULAR | Status: AC
  Administered 2019-03-06: 15 mg via INTRAVENOUS

## 2019-03-06 MED ORDER — CEFAZOLIN SODIUM-DEXTROSE 2-4 GM/100ML-% IV SOLN
2.0000 g | INTRAVENOUS | Status: AC
  Administered 2019-03-06: 2 g via INTRAVENOUS
  Filled 2019-03-06: qty 100

## 2019-03-06 MED ORDER — FENTANYL CITRATE (PF) 100 MCG/2ML IJ SOLN
INTRAMUSCULAR | Status: DC | PRN
  Administered 2019-03-06: 50 ug via INTRAVENOUS
  Administered 2019-03-06: 25 ug via INTRAVENOUS

## 2019-03-06 MED ORDER — HYDROCODONE-ACETAMINOPHEN 5-325 MG PO TABS: 1.0000 | ORAL_TABLET | Freq: Four times a day (QID) | ORAL | 0 refills | Status: DC | PRN

## 2019-03-06 MED ORDER — MIDAZOLAM HCL 2 MG/2ML IJ SOLN: 0.5000 mg | Freq: Once | INTRAMUSCULAR | Status: DC | PRN

## 2019-03-06 MED ORDER — BUPIVACAINE HCL (PF) 0.5 % IJ SOLN
INTRAMUSCULAR | Status: DC | PRN
  Administered 2019-03-06: 3 mL

## 2019-03-06 MED ORDER — KETOROLAC TROMETHAMINE 15 MG/ML IJ SOLN
INTRAMUSCULAR | Status: AC
  Filled 2019-03-06: qty 1

## 2019-03-06 MED ORDER — FENTANYL CITRATE (PF) 100 MCG/2ML IJ SOLN
INTRAMUSCULAR | Status: AC
  Filled 2019-03-06: qty 2

## 2019-03-06 MED ORDER — KETOROLAC TROMETHAMINE 30 MG/ML IJ SOLN
INTRAMUSCULAR | Status: AC
  Filled 2019-03-06: qty 1

## 2019-03-06 MED ORDER — LACTATED RINGERS IV SOLN
INTRAVENOUS | Status: DC
  Administered 2019-03-06: 1000 mL via INTRAVENOUS

## 2019-03-06 MED ORDER — CHLORHEXIDINE GLUCONATE CLOTH 2 % EX PADS: 6.0000 | MEDICATED_PAD | Freq: Once | CUTANEOUS | Status: DC

## 2019-03-06 MED ORDER — LIDOCAINE HCL (PF) 1 % IJ SOLN
INTRAMUSCULAR | Status: AC
  Filled 2019-03-06: qty 30

## 2019-03-06 MED ORDER — MIDAZOLAM HCL 2 MG/2ML IJ SOLN
INTRAMUSCULAR | Status: AC
  Filled 2019-03-06: qty 2

## 2019-03-06 MED ORDER — POVIDONE-IODINE 10 % EX OINT
TOPICAL_OINTMENT | CUTANEOUS | Status: AC
  Filled 2019-03-06: qty 1

## 2019-03-06 MED ORDER — PROPOFOL 10 MG/ML IV BOLUS
INTRAVENOUS | Status: DC | PRN
  Administered 2019-03-06: 200 mg via INTRAVENOUS

## 2019-03-06 MED ORDER — SODIUM CHLORIDE 0.9 % IR SOLN
Status: DC | PRN
  Administered 2019-03-06: 1

## 2019-03-06 MED ORDER — HYDROMORPHONE HCL 1 MG/ML IJ SOLN: 0.2500 mg | INTRAMUSCULAR | Status: DC | PRN

## 2019-03-06 MED ORDER — PROPOFOL 10 MG/ML IV BOLUS
INTRAVENOUS | Status: AC
  Filled 2019-03-06: qty 20

## 2019-03-06 MED ORDER — PROMETHAZINE HCL 25 MG/ML IJ SOLN: 6.2500 mg | INTRAMUSCULAR | Status: DC | PRN

## 2019-03-06 MED ORDER — ONDANSETRON HCL 4 MG/2ML IJ SOLN
INTRAMUSCULAR | Status: DC | PRN
  Administered 2019-03-06: 4 mg via INTRAVENOUS

## 2019-03-06 MED ORDER — BUPIVACAINE HCL (PF) 0.5 % IJ SOLN
INTRAMUSCULAR | Status: AC
  Filled 2019-03-06: qty 30

## 2019-03-06 MED ORDER — MIDAZOLAM HCL 2 MG/2ML IJ SOLN
INTRAMUSCULAR | Status: DC | PRN
  Administered 2019-03-06: 1 mg via INTRAVENOUS

## 2019-03-06 MED ORDER — ONDANSETRON HCL 4 MG/2ML IJ SOLN
INTRAMUSCULAR | Status: AC
  Filled 2019-03-06: qty 2

## 2019-03-06 SURGICAL SUPPLY — 33 items
BANDAGE STRIP 1X3 FLEXIBLE (GAUZE/BANDAGES/DRESSINGS) ×2 IMPLANT
CLOTH BEACON ORANGE TIMEOUT ST (SAFETY) ×3 IMPLANT
COVER LIGHT HANDLE STERIS (MISCELLANEOUS) ×6 IMPLANT
DECANTER SPIKE VIAL GLASS SM (MISCELLANEOUS) ×3 IMPLANT
DERMABOND ADVANCED (GAUZE/BANDAGES/DRESSINGS)
DERMABOND ADVANCED .7 DNX12 (GAUZE/BANDAGES/DRESSINGS) IMPLANT
DRAPE EENT ADH APERT 31X51 STR (DRAPES) ×3 IMPLANT
ELECT NDL TIP 2.8 STRL (NEEDLE) IMPLANT
ELECT NEEDLE TIP 2.8 STRL (NEEDLE) ×3 IMPLANT
ELECT REM PT RETURN 9FT ADLT (ELECTROSURGICAL) ×3
ELECTRODE REM PT RTRN 9FT ADLT (ELECTROSURGICAL) ×1 IMPLANT
FORMALIN 10 PREFIL 120ML (MISCELLANEOUS) ×3 IMPLANT
GLOVE BIO SURGEON STRL SZ 6.5 (GLOVE) ×1 IMPLANT
GLOVE BIO SURGEONS STRL SZ 6.5 (GLOVE) ×1
GLOVE BIOGEL PI IND STRL 7.0 (GLOVE) ×1 IMPLANT
GLOVE BIOGEL PI INDICATOR 7.0 (GLOVE) ×2
GLOVE SURG SS PI 7.5 STRL IVOR (GLOVE) ×6 IMPLANT
GOWN STRL REUS W/TWL LRG LVL3 (GOWN DISPOSABLE) ×8 IMPLANT
KIT TURNOVER KIT A (KITS) ×3 IMPLANT
MANIFOLD NEPTUNE II (INSTRUMENTS) ×3 IMPLANT
NDL HYPO 25X1 1.5 SAFETY (NEEDLE) ×1 IMPLANT
NEEDLE HYPO 25X1 1.5 SAFETY (NEEDLE) ×3 IMPLANT
NS IRRIG 1000ML POUR BTL (IV SOLUTION) ×3 IMPLANT
PACK MINOR (CUSTOM PROCEDURE TRAY) IMPLANT
PAD ARMBOARD 7.5X6 YLW CONV (MISCELLANEOUS) ×3 IMPLANT
SET BASIN LINEN APH (SET/KITS/TRAYS/PACK) ×3 IMPLANT
SUT ETHILON 3 0 FSL (SUTURE) IMPLANT
SUT ETHILON 4 0 P 3 18 (SUTURE) ×4 IMPLANT
SUT MNCRL AB 4-0 PS2 18 (SUTURE) IMPLANT
SUT PROLENE 4 0 PS 2 18 (SUTURE) IMPLANT
SUT VIC AB 3-0 SH 27 (SUTURE)
SUT VIC AB 3-0 SH 27X BRD (SUTURE) IMPLANT
SYR CONTROL 10ML LL (SYRINGE) ×3 IMPLANT

## 2019-03-06 NOTE — Op Note (Signed)
Patient:  Charlotte Lane  DOB:  09-Nov-1945  MRN:  [id number]   Preop Diagnosis: Sebaceous cyst, scalp  Postop Diagnosis: Same  Procedure: Excision of cystic lesion, scalp  Surgeon: Aviva Signs, MD  Anes: General  Indications: Patient is a 73 year old female who presents with a hard lobulated subcutaneous mass in the right parietal region of the scalp.  The risks and benefits of the procedure including bleeding, infection, and the possibility of recurrence of the cyst were fully explained to the patient, who gave informed consent.  Procedure note: The patient was placed in supine position.  After general anesthesia was administered, the hair over the lobulated cystic mass was trimmed.  Surgical site confirmation was performed.  The right parietal region was prepped with Betadine.  Incision was made over the lobulated mass.  Approximately 3 well-formed thick walled cystic lesions were removed.  The total area was 2 cm in its greatest diameter.  They were sent to pathology for further examination.  There was no sebum in the lumen.  The wound was irrigated with normal saline.  0.5% Sensorcaine was instilled into the surrounding wound.  The skin was closed using 4-0 nylon interrupted sutures.  Triple antibiotic ointment was applied.  All tape and needle counts were correct at the end of the procedure.  The patient was awakened and transferred to PACU in stable condition.  Complications: None  EBL: Minimal  Specimen: Cystic lesion, scalp

## 2019-03-06 NOTE — Interval H&P Note (Signed)
History and Physical Interval Note:  03/06/2019 8:09 AM  Charlotte Lane  has presented today for surgery, with the diagnosis of 2CM SEBACIOUS CYST SCALP.  The various methods of treatment have been discussed with the patient and family. After consideration of risks, benefits and other options for treatment, the patient has consented to  Procedure(s): EXCISION SEBACEOUS CYST SCALP (Right) as a surgical intervention.  The patient's history has been reviewed, patient examined, no change in status, stable for surgery.  I have reviewed the patient's chart and labs.  Questions were answered to the patient's satisfaction.     Aviva Signs

## 2019-03-06 NOTE — Anesthesia Preprocedure Evaluation (Addendum)
Anesthesia Evaluation  Patient identified by MRN, date of birth, ID band Patient awake    Reviewed: Allergy & Precautions, NPO status , Patient's Chart, lab work & pertinent test results  Airway Mallampati: III  TM Distance: >3 FB Neck ROM: Full    Dental no notable dental hx. (+) Teeth Intact   Pulmonary neg pulmonary ROS,    Pulmonary exam normal breath sounds clear to auscultation       Cardiovascular Exercise Tolerance: Good hypertension, Pt. on medications negative cardio ROS Normal cardiovascular examI Rhythm:Regular Rate:Normal     Neuro/Psych negative neurological ROS  negative psych ROS   GI/Hepatic negative GI ROS, Neg liver ROS,   Endo/Other  negative endocrine ROS  Renal/GU negative Renal ROS  negative genitourinary   Musculoskeletal negative musculoskeletal ROS (+)   Abdominal   Peds negative pediatric ROS (+)  Hematology negative hematology ROS (+)   Anesthesia Other Findings   Reproductive/Obstetrics negative OB ROS                             Anesthesia Physical Anesthesia Plan  ASA: II  Anesthesia Plan: MAC   Post-op Pain Management:    Induction: Intravenous  PONV Risk Score and Plan: 3 and TIVA, Midazolam, Ondansetron and Treatment may vary due to age or medical condition  Airway Management Planned: Nasal Cannula and Simple Face Mask  Additional Equipment:   Intra-op Plan:   Post-operative Plan:   Informed Consent: I have reviewed the patients History and Physical, chart, labs and discussed the procedure including the risks, benefits and alternatives for the proposed anesthesia with the patient or authorized representative who has indicated his/her understanding and acceptance.     Dental advisory given  Plan Discussed with: CRNA  Anesthesia Plan Comments: (Plan Full PPE use Plan GA(LMA) vs.  GETA as needed  D/W PT -WTP with same after Q&A)        Anesthesia Quick Evaluation

## 2019-03-06 NOTE — Anesthesia Postprocedure Evaluation (Signed)
Anesthesia Post Note  Patient: Charlotte Lane  Procedure(s) Performed: EXCISION SEBACEOUS CYST SCALP (Right Scalp)  Patient location during evaluation: PACU Anesthesia Type: MAC Level of consciousness: awake, awake and alert and patient cooperative Pain management: satisfactory to patient Vital Signs Assessment: post-procedure vital signs reviewed and stable Respiratory status: spontaneous breathing Cardiovascular status: stable Postop Assessment: no apparent nausea or vomiting Anesthetic complications: no     Last Vitals:  Vitals:   03/06/19 0930 03/06/19 0945  BP: 128/69 129/70  Pulse: 69 69  Resp: 12 12  Temp:    SpO2: 94% 93%    Last Pain:  Vitals:   03/06/19 0945  TempSrc:   PainSc: 0-No pain                 Gearold Wainer

## 2019-03-06 NOTE — Anesthesia Procedure Notes (Signed)
Procedure Name: LMA Insertion Date/Time: 03/06/2019 8:44 AM Performed by: Vista Deck, CRNA Pre-anesthesia Checklist: Patient identified, Patient being monitored, Emergency Drugs available, Timeout performed and Suction available Patient Re-evaluated:Patient Re-evaluated prior to induction Oxygen Delivery Method: Circle System Utilized Preoxygenation: Pre-oxygenation with 100% oxygen Induction Type: IV induction Ventilation: Mask ventilation without difficulty LMA: LMA inserted LMA Size: 4.0 Number of attempts: 1 Placement Confirmation: positive ETCO2 and breath sounds checked- equal and bilateral Tube secured with: Tape Dental Injury: Teeth and Oropharynx as per pre-operative assessment

## 2019-03-06 NOTE — Transfer of Care (Signed)
Immediate Anesthesia Transfer of Care Note  Patient: Charlotte Lane  Procedure(s) Performed: EXCISION SEBACEOUS CYST SCALP (Right Scalp)  Patient Location: PACU  Anesthesia Type:General  Level of Consciousness: awake and patient cooperative  Airway & Oxygen Therapy: Patient Spontanous Breathing  Post-op Assessment: Report given to RN and Post -op Vital signs reviewed and stable  Post vital signs: Reviewed and stable  Last Vitals:  Vitals Value Taken Time  BP 129/74 03/06/19 0922  Temp    Pulse 70 03/06/19 0925  Resp 11 03/06/19 0925  SpO2 88 % 03/06/19 0925  Vitals shown include unvalidated device data.  Last Pain:  Vitals:   03/06/19 0724  TempSrc: Oral  PainSc: 0-No pain      Patients Stated Pain Goal: 6 ([id number])  Complications: No apparent anesthesia complications  SEE PACU FLOW SHEETS FOR THIS ENCOUNTER

## 2019-03-07 ENCOUNTER — Encounter (HOSPITAL_COMMUNITY): Payer: Self-pay | Admitting: General Surgery

## 2019-03-07 LAB — SURGICAL PATHOLOGY

## 2019-03-15 ENCOUNTER — Other Ambulatory Visit: Payer: Self-pay

## 2019-03-15 ENCOUNTER — Telehealth: Payer: Self-pay | Admitting: General Surgery

## 2019-09-11 ENCOUNTER — Other Ambulatory Visit: Payer: Self-pay

## 2019-09-11 ENCOUNTER — Emergency Department (HOSPITAL_COMMUNITY)
Admission: EM | Admit: 2019-09-11 | Discharge: 2019-09-11 | Disposition: A | Discharge status: Home / Self Care | Payer: Medicare Other | Attending: Emergency Medicine | Admitting: Emergency Medicine

## 2019-09-11 ENCOUNTER — Encounter (HOSPITAL_COMMUNITY): Payer: Self-pay | Admitting: Emergency Medicine

## 2019-09-11 DIAGNOSIS — Y999 Unspecified external cause status: Secondary | ICD-10-CM | POA: Insufficient documentation

## 2019-09-11 DIAGNOSIS — W57XXXA Bitten or stung by nonvenomous insect and other nonvenomous arthropods, initial encounter: Secondary | ICD-10-CM | POA: Diagnosis not present

## 2019-09-11 DIAGNOSIS — Z79899 Other long term (current) drug therapy: Secondary | ICD-10-CM | POA: Insufficient documentation

## 2019-09-11 DIAGNOSIS — Y929 Unspecified place or not applicable: Secondary | ICD-10-CM | POA: Diagnosis not present

## 2019-09-11 DIAGNOSIS — Z7982 Long term (current) use of aspirin: Secondary | ICD-10-CM | POA: Insufficient documentation

## 2019-09-11 DIAGNOSIS — I10 Essential (primary) hypertension: Secondary | ICD-10-CM | POA: Insufficient documentation

## 2019-09-11 DIAGNOSIS — Y939 Activity, unspecified: Secondary | ICD-10-CM | POA: Insufficient documentation

## 2019-09-11 DIAGNOSIS — S30860A Insect bite (nonvenomous) of lower back and pelvis, initial encounter: Secondary | ICD-10-CM | POA: Diagnosis not present

## 2019-09-11 LAB — BASIC METABOLIC PANEL
Anion gap: 9 (ref 5–15)
BUN: 15 mg/dL (ref 8–23)
CO2: 28 mmol/L (ref 22–32)
Calcium: 10 mg/dL (ref 8.9–10.3)
Chloride: 103 mmol/L (ref 98–111)
Creatinine, Ser: 0.95 mg/dL (ref 0.44–1.00)
GFR calc Af Amer: >60 mL/min (ref >60)
GFR calc non Af Amer: 59 mL/min — ABNORMAL LOW (ref >60)
Glucose, Bld: 88 mg/dL (ref 70–99)
Potassium: 4.1 mmol/L (ref 3.5–5.1)
Sodium: 140 mmol/L (ref 135–145)

## 2019-09-11 MED ORDER — AMLODIPINE BESYLATE 5 MG PO TABS: 5.0000 mg | ORAL_TABLET | Freq: Every day | ORAL | 0 refills | Status: DC

## 2019-09-11 MED ORDER — HYDRALAZINE HCL 20 MG/ML IJ SOLN
10.0000 mg | Freq: Once | INTRAMUSCULAR | Status: AC
  Administered 2019-09-11: 10 mg via INTRAMUSCULAR
  Filled 2019-09-11: qty 1

## 2019-09-11 MED ORDER — AMLODIPINE BESYLATE 5 MG PO TABS
5.0000 mg | ORAL_TABLET | Freq: Once | ORAL | Status: AC
  Administered 2019-09-11: 5 mg via ORAL
  Filled 2019-09-11: qty 1

## 2019-09-11 NOTE — ED Triage Notes (Signed)
Pt had tick pulled off her back this morning.  Has no signs and symptoms of anything, would just like to be checked out.  Pt's bp is elevated, states she take bp meds sometimes but not all the time.

## 2019-09-11 NOTE — ED Provider Notes (Signed)
Care assumed from  PA-C at shift change pending J. Idol PA-C.  See her note for full H&P.   Plan is for blood pressure recheck after IM hydralazine.   Briefly this is a 74 year old female presenting to emergency department with chief complaint of tick bite x1 day ago.  She had a tick on her back that was able to be removed this morning.  Per previous provider patient had no bulls eye or signs of tick borne infection on exam.  She has been out of her amlodipine for months-years and was found to have elevated blood pressure in triage.  She denies any concerning symptoms to include headache, dizziness, chest pain, peripheral edema.  BMP was checked and she has normal kidney function and electrolytes. Patient given 5mg  amlodipine, her usual home dose. No improvement in blood pressure. Given IM hydralazine.   Physical Exam  BP (!) 220/98 (BP Location: Right Arm)   Pulse 69   Temp 98.2 F (36.8 C) (Oral)   Resp 18   Ht 5\' 1"  (1.549 m)   Wt 68 kg   SpO2 100%   BMI 28.34 kg/m   Physical Exam Vitals and nursing note reviewed.  Constitutional:      General: She is not in acute distress.    Appearance: She is not ill-appearing.  HENT:     Head: Normocephalic and atraumatic.     Right Ear: Tympanic membrane and external ear normal.     Left Ear: Tympanic membrane and external ear normal.     Nose: Nose normal.     Mouth/Throat:     Mouth: Mucous membranes are moist.     Pharynx: Oropharynx is clear.  Eyes:     General: No scleral icterus.       Right eye: No discharge.        Left eye: No discharge.     Extraocular Movements: Extraocular movements intact.     Conjunctiva/sclera: Conjunctivae normal.     Pupils: Pupils are equal, round, and reactive to light.  Neck:     Vascular: No JVD.  Cardiovascular:     Rate and Rhythm: Normal rate and regular rhythm.     Pulses: Normal pulses.          Radial pulses are 2+ on the right side and 2+ on the left side.     Heart sounds: Normal  heart sounds.  Pulmonary:     Comments: Lungs clear to auscultation in all fields. Symmetric chest rise. No wheezing, rales, or rhonchi. Abdominal:     Comments: Abdomen is soft, non-distended, and non-tender in all quadrants. No rigidity, no guarding. No peritoneal signs.  Musculoskeletal:        General: Normal range of motion.     Cervical back: Normal range of motion.     Right lower leg: No edema.     Left lower leg: No edema.  Skin:    General: Skin is warm and dry.     Capillary Refill: Capillary refill takes less than 2 seconds.  Neurological:     Mental Status: She is oriented to person, place, and time.     GCS: GCS eye subscore is 4. GCS verbal subscore is 5. GCS motor subscore is 6.     Comments: Fluent speech, no facial droop.  Psychiatric:        Behavior: Behavior normal.     ED Course/Procedures     Results for orders placed or performed during the hospital encounter  of 09/11/19 (from the past 24 hour(s))  Basic metabolic panel     Status: Abnormal   Collection Time: 09/11/19  3:09 PM  Result Value Ref Range   Sodium 140 135 - 145 mmol/L   Potassium 4.1 3.5 - 5.1 mmol/L   Chloride 103 98 - 111 mmol/L   CO2 28 22 - 32 mmol/L   Glucose, Bld 88 70 - 99 mg/dL   BUN 15 8 - 23 mg/dL   Creatinine, Ser 0.95 0.44 - 1.00 mg/dL   Calcium 10.0 8.9 - 10.3 mg/dL   GFR calc non Af Amer 59 (L) >60 mL/min   GFR calc Af Amer >60 >60 mL/min   Anion gap 9 5 - 15   Vitals:   09/11/19 1642 09/11/19 1817  BP: (!) 208/83 (!) 175/67  Pulse: 68 75  Resp: 18 17  Temp:  97.7 F (36.5 C)  SpO2: 98% 100%     MDM  Patient received in signout.  Please see previous providers documentation to include MDM up to this point.  Patient given IM hydralazine and when blood pressure rechecked 30 minutes later it had improved to 175/67.  Given patient had a reassuring exam and there are no signs of hypertension urgency or emergency will discharge home with refill of her amlodipine and  strongly encourage patient to get a PCP.  I again discussed strict return precautions if she develops any concerning symptoms such as fever, headache, rash, joint pain, myalgias.  The patient appears reasonably screened and/or stabilized for discharge and I doubt any other medical condition or other [facility] requiring further screening, evaluation, or treatment in the ED at this time prior to discharge. The patient is safe for discharge with strict return precautions discussed.    Portions of this note were generated with [employer]. Dictation errors may occur despite best attempts at proofreading.      Flint Melter 09/11/19 1826    Milton Ferguson, MD 09/11/19 2316

## 2019-09-11 NOTE — Discharge Instructions (Signed)
You do not have any worrisome symptoms symptoms to suggest infection from this tick bite.  However, if you develop any fevers, body aches or rash within the next 30 days, get rechecked and advise your MD that you had this tick exposure.  Your lab test is normal, (kidney function looks good).  You do need to be on a blood pressure medicine daily. Take your next dose of the amlodipine tomorrow morning.

## 2019-09-11 NOTE — ED Provider Notes (Signed)
[facility] EMERGENCY DEPARTMENT Provider Note   CSN: [id number] Arrival date & time: 09/11/19  1200     History Chief Complaint  Patient presents with  . Insect Bite    Charlotte Lane is a 74 y.o. female with a past medical history significant for hypertension, who used to be treated with amlodipine 5 mg tablets, but states she has been out of this medication for some time, mainly as she does not have a primary medical provider, presenting for evaluation of a tick bite.  She had an embedded tick pulled off of her back this morning.  She denies any symptoms including localized swelling, itching or pain, also no fevers or chills, myalgias, headache or rash.  She also denies chest pain, shortness of breath, denies peripheral edema or vision changes.  The history is provided by the patient.       Past Medical History:  Diagnosis Date  . Hypertension     Patient Active Problem List   Diagnosis Date Noted  . Sebaceous cyst   . HTN (hypertension) 06/25/2015  . Chest pain with moderate risk for cardiac etiology 06/25/2015  . Chest pain 01/23/2013    Past Surgical History:  Procedure Laterality Date  . CYST EXCISION Right 03/06/2019   Procedure: EXCISION SEBACEOUS CYST SCALP;  Surgeon: Aviva Signs, MD;  Location: [facility];  Service: General;  Laterality: Right;  . NO PAST SURGERIES       OB History   No obstetric history on file.     Family History  Problem Relation Age of Onset  . Heart attack Father     Social History   Tobacco Use  . Smoking status: Never Smoker  . Smokeless tobacco: Never Used  Substance Use Topics  . Alcohol use: No  . Drug use: No    Home Medications Prior to Admission medications   Medication Sig Start Date End Date Taking? Authorizing Provider  amLODipine (NORVASC) 5 MG tablet Take 1 tablet (5 mg total) by mouth daily. 09/11/19   Evalee Jefferson, PA-C  aspirin 81 MG chewable tablet Chew 1 tablet (81 mg total) by mouth daily. 06/26/15    Verlee Monte, MD  HYDROcodone-acetaminophen (NORCO) 5-325 MG tablet Take 1 tablet by mouth every 6 (six) hours as needed for moderate pain. 03/06/19   Aviva Signs, MD    Allergies    Patient has no known allergies.  Review of Systems   Review of Systems  Constitutional: Negative for chills and fever.  HENT: Negative for congestion.   Eyes: Negative.  Negative for visual disturbance.  Respiratory: Negative for chest tightness and shortness of breath.   Cardiovascular: Negative for chest pain and leg swelling.  Gastrointestinal: Negative for abdominal pain, nausea and vomiting.  Genitourinary: Negative.   Musculoskeletal: Negative for arthralgias, joint swelling, myalgias and neck pain.  Skin: Negative.  Negative for rash and wound.  Neurological: Negative for dizziness, weakness, light-headedness, numbness and headaches.  Psychiatric/Behavioral: Negative.     Physical Exam Updated Vital Signs BP (!) 208/83 (BP Location: Right Arm)   Pulse 68   Temp 98.2 F (36.8 C) (Oral)   Resp 18   Ht 5\' 1"  (1.549 m)   Wt 68 kg   SpO2 98%   BMI 28.34 kg/m   Physical Exam Vitals and nursing note reviewed.  Constitutional:      Appearance: She is well-developed.  HENT:     Head: Normocephalic and atraumatic.     Mouth/Throat:  Mouth: Mucous membranes are moist.     Pharynx: Oropharynx is clear.  Eyes:     Conjunctiva/sclera: Conjunctivae normal.  Cardiovascular:     Rate and Rhythm: Normal rate and regular rhythm.     Heart sounds: Normal heart sounds. No murmur. No gallop.   Pulmonary:     Effort: Pulmonary effort is normal.     Breath sounds: Normal breath sounds. No wheezing.  Musculoskeletal:        General: No swelling or deformity. Normal range of motion.     Cervical back: Normal range of motion.     Right lower leg: No edema.     Left lower leg: No edema.  Skin:    General: Skin is warm and dry.     Capillary Refill: Capillary refill takes less than 2 seconds.      Comments: Pinpoint lesion right lower back at the site of tick bite.  There is no induration, surrounding erythema, nontender.  No retained foreign body.  Neurological:     General: No focal deficit present.     Mental Status: She is alert.     ED Results / Procedures / Treatments   Labs (all labs ordered are listed, but only abnormal results are displayed) Labs Reviewed  BASIC METABOLIC PANEL - Abnormal; Notable for the following components:      Result Value   GFR calc non Af Amer 59 (*)    All other components within normal limits    EKG None  Radiology No results found.  Procedures Procedures (including critical care time)  Medications Ordered in ED Medications  hydrALAZINE (APRESOLINE) injection 10 mg (has no administration in time range)  amLODipine (NORVASC) tablet 5 mg (5 mg Oral Given 09/11/19 1519)    ED Course  I have reviewed the triage vital signs and the nursing notes.  Pertinent labs & imaging results that were available during my care of the patient were reviewed by me and considered in my medical decision making (see chart for details).    MDM Rules/Calculators/A&P                      Patient with a tick bite with no symptoms, minimal findings at the site of the bite.  There is no concern at this time for tickborne infection.  She was given strict return instructions or recheck by her provider if she develops any symptoms including fever, headache, rash, joint pain, myalgias or flulike symptoms.  Patient has been med is normal with no sign of kidney insufficiency.  She has no other symptoms of endorgan damage.  She was given a dose of her amlodipine 5 mg which did not affect her blood pressure, in fact her BP elevated to systolic of [id number].  She was given an IM dose of hydralazine.  Will recheck blood pressure in 30 minutes.  If her pressure is less than two hundred, would expect discharge home.  She was prescribed her amlodipine and given referrals for  obtaining a PCP.  Patient was discussed with Emeterio Reeve, PA-C who will dispo patient once her BP is improved. Final Clinical Impression(s) / ED Diagnoses Final diagnoses:  Tick bite, initial encounter  Hypertension, unspecified type    Rx / DC Orders ED Discharge Orders         Ordered    amLODipine (NORVASC) 5 MG tablet  Daily     09/11/19 1604  Evalee Jefferson, PA-C 09/11/19 1658    Milton Ferguson, MD 09/11/19 2316

## 2019-12-22 ENCOUNTER — Emergency Department (HOSPITAL_COMMUNITY)
Admission: EM | Admit: 2019-12-22 | Discharge: 2019-12-22 | Disposition: A | Discharge status: Home / Self Care | Payer: Medicare Other | Attending: Emergency Medicine | Admitting: Emergency Medicine

## 2019-12-22 ENCOUNTER — Other Ambulatory Visit: Payer: Self-pay

## 2019-12-22 ENCOUNTER — Encounter (HOSPITAL_COMMUNITY): Payer: Self-pay | Admitting: *Deleted

## 2019-12-22 DIAGNOSIS — Z5321 Procedure and treatment not carried out due to patient leaving prior to being seen by health care provider: Secondary | ICD-10-CM | POA: Insufficient documentation

## 2019-12-22 DIAGNOSIS — R109 Unspecified abdominal pain: Secondary | ICD-10-CM | POA: Diagnosis present

## 2019-12-22 LAB — COMPREHENSIVE METABOLIC PANEL
ALT: 19 U/L (ref 0–44)
AST: 19 U/L (ref 15–41)
Albumin: 4 g/dL (ref 3.5–5.0)
Alkaline Phosphatase: 92 U/L (ref 38–126)
Anion gap: 11 (ref 5–15)
BUN: 15 mg/dL (ref 8–23)
CO2: 26 mmol/L (ref 22–32)
Calcium: 9.3 mg/dL (ref 8.9–10.3)
Chloride: 104 mmol/L (ref 98–111)
Creatinine, Ser: 1.11 mg/dL — ABNORMAL HIGH (ref 0.44–1.00)
GFR calc Af Amer: 57 mL/min — ABNORMAL LOW (ref >60)
GFR calc non Af Amer: 49 mL/min — ABNORMAL LOW (ref >60)
Glucose, Bld: 82 mg/dL (ref 70–99)
Potassium: 3.4 mmol/L — ABNORMAL LOW (ref 3.5–5.1)
Sodium: 141 mmol/L (ref 135–145)
Total Bilirubin: 0.6 mg/dL (ref 0.3–1.2)
Total Protein: 7.5 g/dL (ref 6.5–8.1)

## 2019-12-22 LAB — TROPONIN I (HIGH SENSITIVITY): Troponin I (High Sensitivity): 5 ng/L (ref <18)

## 2019-12-22 NOTE — ED Triage Notes (Signed)
Pt c/o intermittent nausea, not feeling well for the past few days, pt denies any vomiting, diarrhea, fever, sob, abd or chest pain.

## 2019-12-26 ENCOUNTER — Ambulatory Visit
Admission: EM | Admit: 2019-12-26 | Discharge: 2019-12-26 | Disposition: A | Discharge status: Home / Self Care | Payer: Medicare Other | Attending: Emergency Medicine | Admitting: Emergency Medicine

## 2019-12-26 DIAGNOSIS — R6889 Other general symptoms and signs: Secondary | ICD-10-CM | POA: Diagnosis present

## 2019-12-26 DIAGNOSIS — N3000 Acute cystitis without hematuria: Secondary | ICD-10-CM

## 2019-12-26 LAB — POCT URINALYSIS DIP (MANUAL ENTRY)
Bilirubin, UA: NEGATIVE
Blood, UA: NEGATIVE
Glucose, UA: NEGATIVE mg/dL
Ketones, POC UA: NEGATIVE mg/dL
Nitrite, UA: NEGATIVE
Protein Ur, POC: NEGATIVE mg/dL
Spec Grav, UA: <=1.005 — AB (ref 1.010–1.025)
Urobilinogen, UA: 0.2 E.U./dL
pH, UA: 5.5 (ref 5.0–8.0)

## 2019-12-26 MED ORDER — CEPHALEXIN 500 MG PO CAPS: 500.0000 mg | ORAL_CAPSULE | Freq: Two times a day (BID) | ORAL | 0 refills | Status: AC

## 2019-12-26 NOTE — ED Triage Notes (Signed)
Pt states she has not taken blood pressure medicine in a while

## 2019-12-26 NOTE — ED Provider Notes (Signed)
[facility]   CC: Forgetfulness   SUBJECTIVE:  Charlotte Lane is a 74 y.o. female who complains of forgetfulness x several days.  Patient denies a precipitating event, head trauma, or injury.  Denies abdominal or flank pain.  Denies alleviating or aggravating factors.  Denies similar symptoms in the past.  Denies fever, chills, nausea, vomiting, CP, SOB, facial droop, slurred speech, weakness, abdominal pain, flank pain, abnormal vaginal discharge or bleeding, hematuria.    Unable to be seen by PCP  LMP: No LMP recorded. Patient is postmenopausal.  ROS: As in HPI.  All other pertinent ROS negative.     Past Medical History:  Diagnosis Date  . Hypertension    Past Surgical History:  Procedure Laterality Date  . CYST EXCISION Right 03/06/2019   Procedure: EXCISION SEBACEOUS CYST SCALP;  Surgeon: Aviva Signs, MD;  Location: [facility];  Service: General;  Laterality: Right;  . NO PAST SURGERIES     No Known Allergies No current facility-administered medications on file prior to encounter.   Current Outpatient Medications on File Prior to Encounter  Medication Sig Dispense Refill  . amLODipine (NORVASC) 5 MG tablet Take 1 tablet (5 mg total) by mouth daily. 30 tablet 0  . aspirin 81 MG chewable tablet Chew 1 tablet (81 mg total) by mouth daily.    Marland Kitchen HYDROcodone-acetaminophen (NORCO) 5-325 MG tablet Take 1 tablet by mouth every 6 (six) hours as needed for moderate pain. 25 tablet 0   Social History   Socioeconomic History  . Marital status: Single    Spouse name: Not on file  . Number of children: Not on file  . Years of education: Not on file  . Highest education level: Not on file  Occupational History  . Not on file  Tobacco Use  . Smoking status: Never Smoker  . Smokeless tobacco: Never Used  Substance and Sexual Activity  . Alcohol use: No  . Drug use: No  . Sexual activity: Not on file  Other Topics Concern  . Not on file  Social History Narrative  .  Not on file   Social Determinants of Health   Financial Resource Strain:   . Difficulty of Paying Living Expenses: Not on file  Food Insecurity:   . Worried About Charity fundraiser in the Last Year: Not on file  . Ran Out of Food in the Last Year: Not on file  Transportation Needs:   . Lack of Transportation (Medical): Not on file  . Lack of Transportation (Non-Medical): Not on file  Physical Activity:   . Days of Exercise per Week: Not on file  . Minutes of Exercise per Session: Not on file  Stress:   . Feeling of Stress : Not on file  Social Connections:   . Frequency of Communication with Friends and Family: Not on file  . Frequency of Social Gatherings with Friends and Family: Not on file  . Attends Religious Services: Not on file  . Active Member of Clubs or Organizations: Not on file  . Attends Archivist Meetings: Not on file  . Marital Status: Not on file  Intimate Partner Violence:   . Fear of Current or Ex-Partner: Not on file  . Emotionally Abused: Not on file  . Physically Abused: Not on file  . Sexually Abused: Not on file   Family History  Problem Relation Age of Onset  . Heart attack Father     OBJECTIVE:  Vitals:  12/26/19 0923  BP: (!) 170/90  Pulse: 66  Resp: 20  Temp: 97.8 F (36.6 C)  SpO2: 97%   General appearance: Alert in no acute distress HEENT: NCAT.  Oropharynx clear. PERRL,EOMI grossly; nares patent; oropharynx clear Lungs: clear to auscultation bilaterally without adventitious breath sounds Heart: regular rate and rhythm.   Abdomen: soft; non-distended; no tenderness; bowel sounds present; no guarding Back: no CVA tenderness Extremities: no edema; symmetrical with no gross deformities Skin: warm and dry Neurologic: Ambulates from chair to exam table without difficulty; CN 2-12 grossly intact; strength intact about the UEs and LEs; negative pronator drift; finger to nose without difficulty Psychological: alert and  cooperative; normal mood and affect  Labs Reviewed  POCT URINALYSIS DIP (MANUAL ENTRY) - Abnormal; Notable for the following components:      Result Value   Spec Grav, UA <=1.005 (*)    Leukocytes, UA Moderate (2+) (*)    All other components within normal limits  URINE CULTURE   Results for orders placed or performed during the hospital encounter of 12/26/19 (from the past 24 hour(s))  POCT urinalysis dipstick     Status: Abnormal   Collection Time: 12/26/19  9:30 AM  Result Value Ref Range   Color, UA yellow yellow   Clarity, UA clear clear   Glucose, UA negative negative mg/dL   Bilirubin, UA negative negative   Ketones, POC UA negative negative mg/dL   Spec Grav, UA <=1.005 (A) 1.010 - 1.025   Blood, UA negative negative   pH, UA 5.5 5.0 - 8.0   Protein Ur, POC negative negative mg/dL   Urobilinogen, UA 0.2 0.2 or 1.0 E.U./dL   Nitrite, UA Negative Negative   Leukocytes, UA Moderate (2+) (A) Negative    ASSESSMENT & PLAN:  1. Forgetfulness   2. Acute cystitis without hematuria     Meds ordered this encounter  Medications  . cephALEXin (KEFLEX) 500 MG capsule    Sig: Take 1 capsule (500 mg total) by mouth 2 (two) times daily for 10 days.    Dispense:  20 capsule    Refill:  0    Order Specific Question:   Supervising Provider    Answer:   Raylene Everts [[id number]]   Urine concerning for infection at this time.  Does not rule out dementia Urine culture sent.  We will call you with abnormal results.   Push fluids and get plenty of rest.   Take antibiotic as directed and to completion Follow up with PCP for further evaluation and management of forgetfulness  Return here or go to ER if you have any new or worsening symptoms such as fever, chills, nausea, vomiting, chest pain, shortness of breaht, facial droop, slurred speech, weakness, abdominal pain, flank pain, abnormal vaginal discharge or bleeding, hematuria etc...  Outlined signs and symptoms indicating need  for more acute intervention. Patient verbalized understanding. After Visit Summary given.     Stacey Drain Beaver, PA-C 12/26/19 [phone]

## 2019-12-26 NOTE — Discharge Instructions (Signed)
Urine concerning for infection at this time.  Does not rule out dementia Urine culture sent.  We will call you with abnormal results.   Push fluids and get plenty of rest.   Take antibiotic as directed and to completion Follow up with PCP for further evaluation and management of forgetfulness  Return here or go to ER if you have any new or worsening symptoms such as fever, chills, nausea, vomiting, chest pain, shortness of breaht, facial droop, slurred speech, weakness, abdominal pain, flank pain, abnormal vaginal discharge or bleeding, hematuria etc..Marland Kitchen

## 2019-12-26 NOTE — ED Triage Notes (Signed)
Pt reports just not feeling well for several years  But pts significant other states she has been more forgetful over past few days, pt answers all questions appropriately and denies any pain or specific symptom , does not have PCP

## 2019-12-27 LAB — URINE CULTURE: Culture: <10000 — AB

## 2020-01-01 ENCOUNTER — Other Ambulatory Visit (HOSPITAL_COMMUNITY): Payer: Self-pay | Admitting: Family Medicine

## 2020-01-01 DIAGNOSIS — Z1231 Encounter for screening mammogram for malignant neoplasm of breast: Secondary | ICD-10-CM

## 2020-01-18 ENCOUNTER — Emergency Department (HOSPITAL_COMMUNITY): Payer: Medicare Other

## 2020-01-18 ENCOUNTER — Encounter (HOSPITAL_COMMUNITY): Payer: Self-pay | Admitting: Emergency Medicine

## 2020-01-18 ENCOUNTER — Emergency Department (HOSPITAL_COMMUNITY)
Admission: EM | Admit: 2020-01-18 | Discharge: 2020-01-18 | Disposition: A | Discharge status: Home / Self Care | Payer: Medicare Other | Attending: Emergency Medicine | Admitting: Emergency Medicine

## 2020-01-18 ENCOUNTER — Other Ambulatory Visit: Payer: Self-pay

## 2020-01-18 DIAGNOSIS — Z5321 Procedure and treatment not carried out due to patient leaving prior to being seen by health care provider: Secondary | ICD-10-CM | POA: Insufficient documentation

## 2020-01-18 DIAGNOSIS — R42 Dizziness and giddiness: Secondary | ICD-10-CM | POA: Insufficient documentation

## 2020-01-18 DIAGNOSIS — R110 Nausea: Secondary | ICD-10-CM | POA: Insufficient documentation

## 2020-01-18 LAB — COMPREHENSIVE METABOLIC PANEL
ALT: 28 U/L (ref 0–44)
AST: 23 U/L (ref 15–41)
Albumin: 3.9 g/dL (ref 3.5–5.0)
Alkaline Phosphatase: 90 U/L (ref 38–126)
Anion gap: 11 (ref 5–15)
BUN: 15 mg/dL (ref 8–23)
CO2: 24 mmol/L (ref 22–32)
Calcium: 9.2 mg/dL (ref 8.9–10.3)
Chloride: 106 mmol/L (ref 98–111)
Creatinine, Ser: 1.02 mg/dL — ABNORMAL HIGH (ref 0.44–1.00)
GFR calc Af Amer: >60 mL/min (ref >60)
GFR calc non Af Amer: 55 mL/min — ABNORMAL LOW (ref >60)
Glucose, Bld: 99 mg/dL (ref 70–99)
Potassium: 3.7 mmol/L (ref 3.5–5.1)
Sodium: 141 mmol/L (ref 135–145)
Total Bilirubin: 0.8 mg/dL (ref 0.3–1.2)
Total Protein: 7.3 g/dL (ref 6.5–8.1)

## 2020-01-18 LAB — DIFFERENTIAL
Abs Immature Granulocytes: 0.05 10*3/uL (ref 0.00–0.07)
Basophils Absolute: 0.1 10*3/uL (ref 0.0–0.1)
Basophils Relative: 1 %
Eosinophils Absolute: 0.4 10*3/uL (ref 0.0–0.5)
Eosinophils Relative: 5 %
Immature Granulocytes: 1 %
Lymphocytes Relative: 28 %
Lymphs Abs: 2.5 10*3/uL (ref 0.7–4.0)
Monocytes Absolute: 0.7 10*3/uL (ref 0.1–1.0)
Monocytes Relative: 8 %
Neutro Abs: 5.4 10*3/uL (ref 1.7–7.7)
Neutrophils Relative %: 57 %

## 2020-01-18 LAB — CBC
HCT: 43.8 % (ref 36.0–46.0)
Hemoglobin: 13.8 g/dL (ref 12.0–15.0)
MCH: 29.3 pg (ref 26.0–34.0)
MCHC: 31.5 g/dL (ref 30.0–36.0)
MCV: 93 fL (ref 80.0–100.0)
Platelets: 312 10*3/uL (ref 150–400)
RBC: 4.71 MIL/uL (ref 3.87–5.11)
RDW: 13.4 % (ref 11.5–15.5)
WBC: 9.2 10*3/uL (ref 4.0–10.5)
nRBC: 0 % (ref 0.0–0.2)

## 2020-01-18 LAB — PROTIME-INR
INR: 1 (ref 0.8–1.2)
Prothrombin Time: 12.6 seconds (ref 11.4–15.2)

## 2020-01-18 LAB — APTT: aPTT: 26 seconds (ref 24–36)

## 2020-01-18 MED ORDER — SODIUM CHLORIDE 0.9% FLUSH
3.0000 mL | Freq: Once | INTRAVENOUS | Status: DC
Start: 2020-01-18 — End: 2020-01-19

## 2020-01-18 NOTE — ED Triage Notes (Signed)
Patient assessed by Dr Rogene Houston in triage.

## 2020-01-18 NOTE — ED Triage Notes (Signed)
Patient c/o dizziness that started around 12. Patient unable to give me an approximate time. Patient first stated this morning, then a couple hours ago, then 4-5 hours ago, and now around lunch time. Patient states feels like room is shifting. Per patient nausea. Denies any slurred speech, facial drooping, or headache. Patient does state her head feels funny. Dr Rogene Houston made aware and will be assessing patient shortly.

## 2020-01-23 ENCOUNTER — Other Ambulatory Visit (HOSPITAL_COMMUNITY): Payer: Self-pay | Admitting: Family Medicine

## 2020-01-23 ENCOUNTER — Other Ambulatory Visit: Payer: Self-pay | Admitting: Family Medicine

## 2020-01-23 DIAGNOSIS — F0390 Unspecified dementia without behavioral disturbance: Secondary | ICD-10-CM

## 2020-02-05 ENCOUNTER — Encounter (HOSPITAL_COMMUNITY): Payer: Self-pay

## 2020-02-05 ENCOUNTER — Ambulatory Visit (HOSPITAL_COMMUNITY): Payer: Medicare Other

## 2020-02-24 ENCOUNTER — Telehealth: Payer: Self-pay | Admitting: Nurse Practitioner

## 2020-02-24 NOTE — Telephone Encounter (Signed)
Spoke with patient's significant other, [name], regarding the Palliative referral/services and all questions were answered and he was in agreement with scheduling visit.  I have scheduled an In-person Consult for 03/10/20 @ 8:45 AM.

## 2020-02-26 ENCOUNTER — Inpatient Hospital Stay
Admission: EM | Admit: 2020-02-26 | Discharge: 2020-03-16 | DRG: 309 | Disposition: A | Discharge status: Nursing Home (Includes ICF) | Payer: Medicare Other | Attending: Internal Medicine | Admitting: Internal Medicine

## 2020-02-26 ENCOUNTER — Emergency Department: Payer: Medicare Other

## 2020-02-26 ENCOUNTER — Other Ambulatory Visit: Payer: Self-pay

## 2020-02-26 DIAGNOSIS — R4189 Other symptoms and signs involving cognitive functions and awareness: Secondary | ICD-10-CM | POA: Diagnosis present

## 2020-02-26 DIAGNOSIS — E538 Deficiency of other specified B group vitamins: Secondary | ICD-10-CM | POA: Diagnosis present

## 2020-02-26 DIAGNOSIS — I10 Essential (primary) hypertension: Secondary | ICD-10-CM | POA: Diagnosis present

## 2020-02-26 DIAGNOSIS — F32A Depression, unspecified: Secondary | ICD-10-CM | POA: Diagnosis present

## 2020-02-26 DIAGNOSIS — Z8249 Family history of ischemic heart disease and other diseases of the circulatory system: Secondary | ICD-10-CM

## 2020-02-26 DIAGNOSIS — F0281 Dementia in other diseases classified elsewhere with behavioral disturbance: Secondary | ICD-10-CM | POA: Diagnosis not present

## 2020-02-26 DIAGNOSIS — Z79899 Other long term (current) drug therapy: Secondary | ICD-10-CM

## 2020-02-26 DIAGNOSIS — F0390 Unspecified dementia without behavioral disturbance: Secondary | ICD-10-CM

## 2020-02-26 DIAGNOSIS — R4182 Altered mental status, unspecified: Secondary | ICD-10-CM | POA: Diagnosis present

## 2020-02-26 DIAGNOSIS — I4891 Unspecified atrial fibrillation: Secondary | ICD-10-CM

## 2020-02-26 DIAGNOSIS — G301 Alzheimer's disease with late onset: Secondary | ICD-10-CM | POA: Diagnosis not present

## 2020-02-26 DIAGNOSIS — Z7982 Long term (current) use of aspirin: Secondary | ICD-10-CM

## 2020-02-26 DIAGNOSIS — F0391 Unspecified dementia with behavioral disturbance: Secondary | ICD-10-CM | POA: Diagnosis present

## 2020-02-26 DIAGNOSIS — Z20822 Contact with and (suspected) exposure to covid-19: Secondary | ICD-10-CM | POA: Diagnosis present

## 2020-02-26 DIAGNOSIS — K029 Dental caries, unspecified: Secondary | ICD-10-CM | POA: Diagnosis present

## 2020-02-26 DIAGNOSIS — I480 Paroxysmal atrial fibrillation: Secondary | ICD-10-CM | POA: Diagnosis not present

## 2020-02-26 DIAGNOSIS — K047 Periapical abscess without sinus: Secondary | ICD-10-CM | POA: Diagnosis not present

## 2020-02-26 LAB — COMPREHENSIVE METABOLIC PANEL
ALT: 12 U/L (ref 0–44)
AST: 17 U/L (ref 15–41)
Albumin: 4 g/dL (ref 3.5–5.0)
Alkaline Phosphatase: 90 U/L (ref 38–126)
Anion gap: 8 (ref 5–15)
BUN: 16 mg/dL (ref 8–23)
CO2: 27 mmol/L (ref 22–32)
Calcium: 9.3 mg/dL (ref 8.9–10.3)
Chloride: 106 mmol/L (ref 98–111)
Creatinine, Ser: 0.95 mg/dL (ref 0.44–1.00)
GFR, Estimated: >60 mL/min (ref >60)
Glucose, Bld: 100 mg/dL — ABNORMAL HIGH (ref 70–99)
Potassium: 3.6 mmol/L (ref 3.5–5.1)
Sodium: 141 mmol/L (ref 135–145)
Total Bilirubin: 0.9 mg/dL (ref 0.3–1.2)
Total Protein: 7.3 g/dL (ref 6.5–8.1)

## 2020-02-26 LAB — CBC WITH DIFFERENTIAL/PLATELET
Abs Immature Granulocytes: 0.05 10*3/uL (ref 0.00–0.07)
Basophils Absolute: 0.1 10*3/uL (ref 0.0–0.1)
Basophils Relative: 1 %
Eosinophils Absolute: 0.2 10*3/uL (ref 0.0–0.5)
Eosinophils Relative: 2 %
HCT: 46.9 % — ABNORMAL HIGH (ref 36.0–46.0)
Hemoglobin: 15.4 g/dL — ABNORMAL HIGH (ref 12.0–15.0)
Immature Granulocytes: 0 %
Lymphocytes Relative: 31 %
Lymphs Abs: 3.8 10*3/uL (ref 0.7–4.0)
MCH: 29.3 pg (ref 26.0–34.0)
MCHC: 32.8 g/dL (ref 30.0–36.0)
MCV: 89.3 fL (ref 80.0–100.0)
Monocytes Absolute: 1.3 10*3/uL — ABNORMAL HIGH (ref 0.1–1.0)
Monocytes Relative: 11 %
Neutro Abs: 6.8 10*3/uL (ref 1.7–7.7)
Neutrophils Relative %: 55 %
Platelets: 296 10*3/uL (ref 150–400)
RBC: 5.25 MIL/uL — ABNORMAL HIGH (ref 3.87–5.11)
RDW: 13.9 % (ref 11.5–15.5)
WBC: 12.2 10*3/uL — ABNORMAL HIGH (ref 4.0–10.5)
nRBC: 0 % (ref 0.0–0.2)

## 2020-02-26 LAB — TROPONIN I (HIGH SENSITIVITY): Troponin I (High Sensitivity): 12 ng/L (ref <18)

## 2020-02-26 LAB — RESPIRATORY PANEL BY RT PCR (FLU A&B, COVID)
Influenza A by PCR: NEGATIVE
Influenza B by PCR: NEGATIVE
SARS Coronavirus 2 by RT PCR: NEGATIVE

## 2020-02-26 LAB — LACTIC ACID, PLASMA: Lactic Acid, Venous: 1.4 mmol/L (ref 0.5–1.9)

## 2020-02-26 LAB — MAGNESIUM: Magnesium: 2.2 mg/dL (ref 1.7–2.4)

## 2020-02-26 MED ORDER — SODIUM CHLORIDE 0.9 % IV BOLUS
1000.0000 mL | Freq: Once | INTRAVENOUS | Status: AC
  Administered 2020-02-26: 1000 mL via INTRAVENOUS

## 2020-02-26 MED ORDER — ACETAMINOPHEN 650 MG RE SUPP: 650.0000 mg | Freq: Four times a day (QID) | RECTAL | Status: DC | PRN

## 2020-02-26 MED ORDER — DILTIAZEM HCL-DEXTROSE 125-5 MG/125ML-% IV SOLN (PREMIX)
5.0000 mg/h | INTRAVENOUS | Status: DC
  Administered 2020-02-26: 5 mg/h via INTRAVENOUS
  Filled 2020-02-26: qty 125

## 2020-02-26 MED ORDER — ENOXAPARIN SODIUM 40 MG/0.4ML ~~LOC~~ SOLN
40.0000 mg | SUBCUTANEOUS | Status: DC
  Administered 2020-02-27: 40 mg via SUBCUTANEOUS
  Filled 2020-02-26 (×2): qty 0.4

## 2020-02-26 MED ORDER — DILTIAZEM HCL 25 MG/5ML IV SOLN
10.0000 mg | Freq: Once | INTRAVENOUS | Status: AC
  Administered 2020-02-26: 25 mg via INTRAVENOUS
  Filled 2020-02-26: qty 5

## 2020-02-26 MED ORDER — SODIUM CHLORIDE 0.9% FLUSH
3.0000 mL | Freq: Two times a day (BID) | INTRAVENOUS | Status: DC
  Administered 2020-02-26 – 2020-03-14 (×32): 3 mL via INTRAVENOUS

## 2020-02-26 MED ORDER — METOPROLOL TARTRATE 25 MG PO TABS
12.5000 mg | ORAL_TABLET | Freq: Two times a day (BID) | ORAL | Status: DC
  Administered 2020-02-27 – 2020-03-15 (×31): 12.5 mg via ORAL
  Filled 2020-02-26 (×36): qty 1

## 2020-02-26 MED ORDER — ACETAMINOPHEN 325 MG PO TABS
650.0000 mg | ORAL_TABLET | Freq: Four times a day (QID) | ORAL | Status: DC | PRN
  Administered 2020-03-08: 650 mg via ORAL
  Filled 2020-02-26: qty 2

## 2020-02-26 NOTE — ED Notes (Signed)
[facility] 

## 2020-02-26 NOTE — ED Notes (Signed)
Patient covid swabbed and walked to lab

## 2020-02-26 NOTE — ED Provider Notes (Signed)
[facility] Emergency Department Provider Lane    First MD Initiated Contact with Patient 02/26/20 2013     (approximate)  I have reviewed the triage vital signs and the nursing notes.   HISTORY  Chief Complaint Atrial Fibrillation  History limited due to disorientation, question dementia  HPI Charlotte Lane is a 74 y.o. female presents to the ER for evaluation of A. fib with RVR. Patient poor historian not describing much of the reason why she was brought to the ER but comes to the ER via EMS under IVC. Reportedly was called out to her house for a well check. Was found to be in A. fib with RVR. Per IVC paperwork medic then spoke with Dr. Clyda Greener who advised patient to be placed under IVC due to questionable history of dementia and unable to care for herself. Patient denies any discomfort. She is disoriented to place and time thinks that it is February but able to have a normal conversation. Denies any history of A. fib.   Past Medical History:  Diagnosis Date  . Hypertension    Family History  Problem Relation Age of Onset  . Heart attack Father    Past Surgical History:  Procedure Laterality Date  . CYST EXCISION Right 03/06/2019   Procedure: EXCISION SEBACEOUS CYST SCALP;  Surgeon: Aviva Signs, MD;  Location: [facility];  Service: General;  Laterality: Right;  . NO PAST SURGERIES     Patient Active Problem List   Diagnosis Date Noted  . Sebaceous cyst   . HTN (hypertension) 06/25/2015  . Chest pain with moderate risk for cardiac etiology 06/25/2015  . Chest pain 01/23/2013      Prior to Admission medications   Medication Sig Start Date End Date Taking? Authorizing Provider  amLODipine (NORVASC) 5 MG tablet Take 1 tablet (5 mg total) by mouth daily. 09/11/19   Evalee Jefferson, PA-C  aspirin 81 MG chewable tablet Chew 1 tablet (81 mg total) by mouth daily. 06/26/15   Verlee Monte, MD  HYDROcodone-acetaminophen (NORCO) 5-325 MG tablet Take 1 tablet  by mouth every 6 (six) hours as needed for moderate pain. 03/06/19   Aviva Signs, MD    Allergies Patient has no known allergies.    Social History Social History   Tobacco Use  . Smoking status: Never Smoker  . Smokeless tobacco: Never Used  Vaping Use  . Vaping Use: Never used  Substance Use Topics  . Alcohol use: No  . Drug use: No    Review of Systems Patient denies headaches, rhinorrhea, blurry vision, numbness, shortness of breath, chest pain, edema, cough, abdominal pain, nausea, vomiting, diarrhea, dysuria, fevers, rashes or hallucinations unless otherwise stated above in HPI. ____________________________________________   PHYSICAL EXAM:  VITAL SIGNS: Vitals:   02/26/20 2012 02/26/20 2101  BP: (!) 115/99 (!) 141/72  Pulse: (!) 138   Resp: (!) 22 17  Temp: (!) 97.5 F (36.4 C)   SpO2: 100%     Constitutional: Alert and disoriented x 2 Eyes: Conjunctivae are normal.  Head: Atraumatic. Nose: No congestion/rhinnorhea. Mouth/Throat: Mucous membranes are moist.   Neck: No stridor. Painless ROM.  Cardiovascular: Normal rate, regular rhythm. Grossly normal heart sounds.  Good peripheral circulation. Respiratory: Normal respiratory effort.  No retractions. Lungs CTAB. Gastrointestinal: Soft and nontender. No distention. No abdominal bruits. No CVA tenderness. Genitourinary:  Musculoskeletal: No lower extremity tenderness nor edema.  No joint effusions. Neurologic:  Normal speech, confused. No lateralizing weakness, no facial droop Skin:  Skin is warm, dry and intact. No rash noted. Psychiatric: calm and cooperative  ____________________________________________   LABS (all labs ordered are listed, but only abnormal results are displayed)  Results for orders placed or performed during the hospital encounter of 02/26/20 (from the past 24 hour(s))  CBC with Differential     Status: Abnormal   Collection Time: 02/26/20  8:12 PM  Result Value Ref Range   WBC  12.2 (H) 4.0 - 10.5 K/uL   RBC 5.25 (H) 3.87 - 5.11 MIL/uL   Hemoglobin 15.4 (H) 12.0 - 15.0 g/dL   HCT 46.9 (H) 36 - 46 %   MCV 89.3 80.0 - 100.0 fL   MCH 29.3 26.0 - 34.0 pg   MCHC 32.8 30.0 - 36.0 g/dL   RDW 13.9 11.5 - 15.5 %   Platelets 296 150 - 400 K/uL   nRBC 0.0 0.0 - 0.2 %   Neutrophils Relative % 55 %   Neutro Abs 6.8 1.7 - 7.7 K/uL   Lymphocytes Relative 31 %   Lymphs Abs 3.8 0.7 - 4.0 K/uL   Monocytes Relative 11 %   Monocytes Absolute 1.3 (H) 0.1 - 1.0 K/uL   Eosinophils Relative 2 %   Eosinophils Absolute 0.2 0.0 - 0.5 K/uL   Basophils Relative 1 %   Basophils Absolute 0.1 0.0 - 0.1 K/uL   Immature Granulocytes 0 %   Abs Immature Granulocytes 0.05 0.00 - 0.07 K/uL  Comprehensive metabolic panel     Status: Abnormal   Collection Time: 02/26/20  8:12 PM  Result Value Ref Range   Sodium 141 135 - 145 mmol/L   Potassium 3.6 3.5 - 5.1 mmol/L   Chloride 106 98 - 111 mmol/L   CO2 27 22 - 32 mmol/L   Glucose, Bld 100 (H) 70 - 99 mg/dL   BUN 16 8 - 23 mg/dL   Creatinine, Ser 0.95 0.44 - 1.00 mg/dL   Calcium 9.3 8.9 - 10.3 mg/dL   Total Protein 7.3 6.5 - 8.1 g/dL   Albumin 4.0 3.5 - 5.0 g/dL   AST 17 15 - 41 U/L   ALT 12 0 - 44 U/L   Alkaline Phosphatase 90 38 - 126 U/L   Total Bilirubin 0.9 0.3 - 1.2 mg/dL   GFR, Estimated >60 >60 mL/min   Anion gap 8 5 - 15  Magnesium     Status: None   Collection Time: 02/26/20  8:12 PM  Result Value Ref Range   Magnesium 2.2 1.7 - 2.4 mg/dL  Lactic acid, plasma     Status: None   Collection Time: 02/26/20  8:12 PM  Result Value Ref Range   Lactic Acid, Venous 1.4 0.5 - 1.9 mmol/L  Troponin I (High Sensitivity)     Status: None   Collection Time: 02/26/20  8:12 PM  Result Value Ref Range   Troponin I (High Sensitivity) 12 <18 ng/L   ____________________________________________  EKG My review and personal interpretation at Time: 20:08   Indication: ams  Rate: 145  Rhythm: afib with RVR Axis: normal Other: normal  intervals, non specific st abn, likely rate dependent ____________________________________________  RADIOLOGY  I personally reviewed all radiographic images ordered to evaluate for the above acute complaints and reviewed radiology reports and findings.  These findings were personally discussed with the patient.  Please see medical record for radiology report.  ____________________________________________   PROCEDURES  Procedure(s) performed:  .Critical Care Performed by: Charlotte Lot, MD Authorized by: Charlotte Lot, MD  Critical care provider statement:    Critical care time (minutes):  30   Critical care time was exclusive of:  Separately billable procedures and treating other patients   Critical care was necessary to treat or prevent imminent or life-threatening deterioration of the following conditions:  Cardiac failure and circulatory failure   Critical care was time spent personally by me on the following activities:  Development of treatment plan with patient or surrogate, discussions with consultants, evaluation of patient's response to treatment, examination of patient, obtaining history from patient or surrogate, ordering and performing treatments and interventions, ordering and review of laboratory studies, ordering and review of radiographic studies, pulse oximetry, re-evaluation of patient's condition and review of old charts  .1-3 Lead EKG Interpretation Performed by: Charlotte Lot, MD Authorized by: Charlotte Lot, MD     Interpretation: abnormal     ECG rate:  130   ECG rate assessment: tachycardic     Rhythm: atrial fibrillation     Ectopy: none        Critical Care performed: yes ____________________________________________   INITIAL IMPRESSION / ASSESSMENT AND PLAN / ED COURSE  Pertinent labs & imaging results that were available during my care of the patient were reviewed by me and considered in my medical decision making (see chart for  details).   DDX: Dehydration, sepsis, pna, uti, hypoglycemia, cva, drug effect, withdrawal,   MARIROSE DEVENEY is a 74 y.o. who presents to the ED with presentation as described above. Patient nontoxic is protecting her airway. Seems like a very unreliable historian vitals any documented history of dementia. She is not febrile is tachycardic with A. fib with RVR. I will order IV Cardizem drip will give IV fluids. Will check blood work. Will order imaging for the above differential.  Also any documentation of dementia in her medical record here but the IVC paperwork refers to questionable dementia.  In the setting of A. fib with RVR also question whether this could be CVA but uncertain timeframe.  Lack of history and timing on her presentation certainly makes this more complicated.  The patient will be placed on continuous pulse oximetry and telemetry for monitoring.  Laboratory evaluation will be sent to evaluate for the above complaints.    fluids  Clinical Course as of Feb 25 2246  Wed Feb 26, 2020  2239 Patient appears to have spontaneously converted back into sinus rhythm.  Given her presentation with A. fib with RVR as well as confusion will discuss with hospitalist for admission.   [PR]    Clinical Course User Index [PR] Charlotte Lot, MD    The patient was evaluated in Emergency Department today for the symptoms described in the history of present illness. He/she was evaluated in the context of the global COVID-19 pandemic, which necessitated consideration that the patient might be at risk for infection with the SARS-CoV-2 virus that causes COVID-19. Institutional protocols and algorithms that pertain to the evaluation of patients at risk for COVID-19 are in a state of rapid change based on information released by regulatory bodies including the CDC and federal and state organizations. These policies and algorithms were followed during the patient's care in the ED.  As part of my medical  decision making, I reviewed the following data within the [place] notes reviewed and incorporated, Labs reviewed, notes from prior ED visits and [place] Controlled Substance Database   ____________________________________________   FINAL CLINICAL IMPRESSION(S) / ED DIAGNOSES  Final diagnoses:  Atrial  fibrillation with RVR (HCC)  Altered mental status, unspecified altered mental status type      NEW MEDICATIONS STARTED DURING THIS VISIT:  New Prescriptions   No medications on file     Lane:  This document was prepared using Dragon voice recognition software and may include unintentional dictation errors.    Charlotte Lot, MD 02/26/20 [phone]

## 2020-02-26 NOTE — H&P (Signed)
History and Physical    Charlotte Lane IWP:[id number] DOB: [birth year] DOA: 02/26/2020  PCP: [facility]  Patient coming from: Home via EMS under IVC  I have personally briefly reviewed patient's old medical records in [place]  Chief Complaint: A. fib  HPI: Charlotte Lane is a 74 y.o. female with medical history significant for hypertension who presents to the ED under IVC via EMS for evaluation of A. fib with RVR.  History is supplemented by EDP and chart review.  Patient states she is not really sure why she is in the hospital.  She says a lot was going on earlier today.  Per ED documentation, EMS were called out to her house for a well check.  She was found to be in A. fib with RVR.  EMS spoke with patient's PCP Dr. Earma Reading who advised placing patient under IVC due to questionable history of dementia and inability to care for herself.  At time of my evaluation on admission, patient's only complaint is tightness in both of her legs and wanting to get up and walk.  She does report occasional feelings of palpitations.  She admits to some memory issues recently.  She otherwise denies any chest pain, dyspnea, cough, nausea, vomiting, abdominal pain, dysuria, diarrhea, swelling in her feet or legs.  She reports a history of high blood pressure and otherwise denies any other known medical conditions.  She says she does not take any medications regularly.  She denies any history of tobacco, alcohol, or illicit drug use.  She says she has been living with someone else, but that person is leaving and she asked she is now going to have to live on her own.  She says she has a brother close by but does not have any contact information.  ED Course:  Initial vitals showed BP 115/99, pulse 138, RR 22, temp 97.5 Fahrenheit, SPO2 100% on room air.  Per ED documentation, patient was in atrial fibrillation with RVR with heart rate up to 140s.  Labs showed WBC 12.2,  hemoglobin 15.4, platelets 296,000, sodium 141, potassium 3.6, bicarb 27, BUN 16, creatinine 0.95, serum glucose 100, LFTs within normal limits, magnesium 2.2, lactic acid 1.4, high-sensitivity troponin I 12.  SARS-CoV-2 PCR is negative.  Portable chest x-ray was negative for focal consolidation, edema, or effusion.  CT head without contrast was negative for acute intracranial abnormality.  Age-related atrophy and chronic small vessel ischemic changes are noted.  Patient was given 1 L normal saline, IV diltiazem 10 mg followed by continuous IV diltiazem infusion.  Patient converted to normal sinus rhythm after approximately 1.5 hours on diltiazem drip.  Due to apparent new finding of A. fib with RVR and concern for potential CVA the hospitalist service was consulted to admit for further evaluation and management.  Review of Systems:  All systems reviewed and are negative except as documented in history of present illness above.   Past Medical History:  Diagnosis Date  . Hypertension     Past Surgical History:  Procedure Laterality Date  . CYST EXCISION Right 03/06/2019   Procedure: EXCISION SEBACEOUS CYST SCALP;  Surgeon: Aviva Signs, MD;  Location: [facility];  Service: General;  Laterality: Right;  . NO PAST SURGERIES      Social History:  reports that she has never smoked. She has never used smokeless tobacco. She reports that she does not drink alcohol and does not use drugs.  No Known Allergies  Family  History  Problem Relation Age of Onset  . Heart attack Father      Prior to Admission medications   Medication Sig Start Date End Date Taking? Authorizing Provider  amLODipine (NORVASC) 5 MG tablet Take 1 tablet (5 mg total) by mouth daily. 09/11/19   Evalee Jefferson, PA-C  aspirin 81 MG chewable tablet Chew 1 tablet (81 mg total) by mouth daily. 06/26/15   Verlee Monte, MD  HYDROcodone-acetaminophen (NORCO) 5-325 MG tablet Take 1 tablet by mouth every 6 (six) hours as needed for  moderate pain. 03/06/19   Aviva Signs, MD    Physical Exam: Vitals:   02/26/20 2007 02/26/20 2012 02/26/20 2101  BP:  (!) 115/99 (!) 141/72  Pulse:  (!) 138   Resp:  (!) 22 17  Temp:  (!) 97.5 F (36.4 C)   TempSrc:  Oral   SpO2:  100%   Weight: 56.9 kg    Height: 5' (1.524 m)     Constitutional: Resting in bed with head elevated, NAD, calm, comfortable Eyes: PERRL, lids and conjunctivae normal ENMT: Mucous membranes are moist. Posterior pharynx clear of any exudate or lesions.Normal dentition.  Neck: normal, supple, no masses. Respiratory: clear to auscultation bilaterally, no wheezing, no crackles. Normal respiratory effort. No accessory muscle use.  Cardiovascular: Regular rate and rhythm, no murmurs / rubs / gallops. No extremity edema. 2+ pedal pulses. Abdomen: no tenderness, no masses palpated. No hepatosplenomegaly. Bowel sounds positive.  Musculoskeletal: no clubbing / cyanosis. No joint deformity upper and lower extremities. Good ROM, no contractures. Normal muscle tone.  Skin: no rashes, lesions, ulcers. No induration Neurologic: CN 2-12 grossly intact. Sensation intact, Strength 5/5 in all 4.  Psychiatric: Awake, alert, and oriented to self and year.  Not oriented to place or month.  Labs on Admission: I have personally reviewed following labs and imaging studies  CBC: Recent Labs  Lab 02/26/20 2012  WBC 12.2*  NEUTROABS 6.8  HGB 15.4*  HCT 46.9*  MCV 89.3  PLT 017   Basic Metabolic Panel: Recent Labs  Lab 02/26/20 2012  NA 141  K 3.6  CL 106  CO2 27  GLUCOSE 100*  BUN 16  CREATININE 0.95  CALCIUM 9.3  MG 2.2   GFR: Estimated Creatinine Clearance: 41.7 mL/min (by C-G formula based on SCr of 0.95 mg/dL). Liver Function Tests: Recent Labs  Lab 02/26/20 2012  AST 17  ALT 12  ALKPHOS 90  BILITOT 0.9  PROT 7.3  ALBUMIN 4.0   No results for input(s): LIPASE, AMYLASE in the last 168 hours. No results for input(s): AMMONIA in the last 168  hours. Coagulation Profile: No results for input(s): INR, PROTIME in the last 168 hours. Cardiac Enzymes: No results for input(s): CKTOTAL, CKMB, CKMBINDEX, TROPONINI in the last 168 hours. BNP (last 3 results) No results for input(s): PROBNP in the last 8760 hours. HbA1C: No results for input(s): HGBA1C in the last 72 hours. CBG: No results for input(s): GLUCAP in the last 168 hours. Lipid Profile: No results for input(s): CHOL, HDL, LDLCALC, TRIG, CHOLHDL, LDLDIRECT in the last 72 hours. Thyroid Function Tests: No results for input(s): TSH, T4TOTAL, FREET4, T3FREE, THYROIDAB in the last 72 hours. Anemia Panel: No results for input(s): VITAMINB12, FOLATE, FERRITIN, TIBC, IRON, RETICCTPCT in the last 72 hours. Urine analysis:    Component Value Date/Time   COLORURINE YELLOW 01/20/2013 1649   APPEARANCEUR CLEAR 01/20/2013 1649   LABSPEC <1.005 (L) 01/20/2013 1649   PHURINE 6.0 01/20/2013 1649  GLUCOSEU NEGATIVE 01/20/2013 1649   HGBUR NEGATIVE 01/20/2013 1649   BILIRUBINUR negative 12/26/2019 0930   KETONESUR negative 12/26/2019 0930   KETONESUR NEGATIVE 01/20/2013 1649   PROTEINUR negative 12/26/2019 0930   PROTEINUR NEGATIVE 01/20/2013 1649   UROBILINOGEN 0.2 12/26/2019 0930   UROBILINOGEN 0.2 01/20/2013 1649   NITRITE Negative 12/26/2019 0930   NITRITE NEGATIVE 01/20/2013 1649   LEUKOCYTESUR Moderate (2+) (A) 12/26/2019 0930    Radiological Exams on Admission: CT Head Wo Contrast  Result Date: 02/26/2020 CLINICAL DATA:  Mental status change EXAM: CT HEAD WITHOUT CONTRAST TECHNIQUE: Contiguous axial images were obtained from the base of the skull through the vertex without intravenous contrast. COMPARISON:  January 18, 2020 FINDINGS: Brain: No evidence of acute territorial infarction, hemorrhage, hydrocephalus,extra-axial collection or mass lesion/mass effect. There is dilatation the ventricles and sulci consistent with age-related atrophy. Low-attenuation changes in the  deep white matter consistent with small vessel ischemia. Vascular: No hyperdense vessel or unexpected calcification. Skull: The skull is intact. No fracture or focal lesion identified. Sinuses/Orbits: The visualized paranasal sinuses and mastoid air cells are clear. The orbits and globes intact. Other: None IMPRESSION: No acute intracranial abnormality. Findings consistent with age related atrophy and chronic small vessel ischemia Electronically Signed   By: Prudencio Pair M.D.   On: 02/26/2020 21:27   DG Chest Portable 1 View  Result Date: 02/26/2020 CLINICAL DATA:  Altered mental status EXAM: PORTABLE CHEST 1 VIEW COMPARISON:  06/24/2015 FINDINGS: No new consolidation or edema. No pleural effusion. Stable cardiomediastinal contours. The visualized skeletal structures are unremarkable. IMPRESSION: No acute process in the chest. Electronically Signed   By: Macy Mis M.D.   On: 02/26/2020 20:54    EKG: Personally reviewed.  Normal sinus rhythm without acute ischemic changes.  Not significantly changed when compared to prior.  Assessment/Plan Principal Problem:   Paroxysmal atrial fibrillation with rapid ventricular response (HCC) Active Problems:   HTN (hypertension)   Altered mental status  DONTA MCINROY is a 74 y.o. female with medical history significant for hypertension who is admitted with A. fib with RVR.  Paroxysmal atrial fibrillation with RVR: Patient presenting to ED with A. fib RVR, no known prior history of atrial fibrillation.  Initially on diltiazem drip, has now converted to normal sinus rhythm with controlled rate.  CHA2DS2-VASc score is at least 3.  Unclear if she is an appropriate candidate for anticoagulation without known baseline cognitive status. -DC diltiazem drip -Start metoprolol 12.5 mg twice daily -Echocardiogram -Monitor on telemetry  Question of altered mental status versus cognitive impairment/dementia: Brought in under IVC by EMS per patient's PCP due to  concern of underlying dementia/cognitive impairment and inability to care for self per ED documentation.  No family/close contact available on admission to provide further history.  Per chart review, she is scheduled for a palliative care visit on 03/10/2020.  CT head is negative for acute changes on admission.  No evidence of infection.  Possible she may have had a CVA given her A. Fib. -Obtain MRI brain without contrast -Continue neurochecks -Check TSH, B12 -Consult to TOC  Hypertension: Currently stable.  Unclear if on antihypertensives as an outpatient.  Allow permissive hypertension for now pending MRI brain to rule out acute CVA.  DVT prophylaxis: Lovenox Code Status: Full code Family Communication: No family present on admission, no contact information available Disposition Plan: From home, dispo pending further work-up of A. fib, mental status, and safe discharge plan. Consults called: None Admission status:  Status  is: Observation  The patient remains OBS appropriate and will d/c before 2 midnights.  Dispo: The patient is from: Home              Anticipated d/c is to: TBD, may need nursing home care pending clinical progress              Anticipated d/c date is: 1 day              Patient currently is not medically stable to d/c.  Zada Finders MD [facility]  If 7PM-7AM, please contact night-coverage [email or web address]  02/26/2020, 11:35 PM

## 2020-02-26 NOTE — ED Notes (Signed)
Diltiazem bolus given with gtt up at 5mg /hr.  After administration of bolus patient with decrease of HR from 140's to 60's conversion to NSR noted on monitor.  Patient denies any chest pain, denies any shortness of breath.  Repeat EKG obtained and given to Dr. Quentin Cornwall.  VSS at 109/66-66-17-100% on room air.

## 2020-02-26 NOTE — ED Triage Notes (Signed)
Pt to ED via EMS from home, pt arrives under IVC, from a community paramedic, for a fib rvr. Pt does not have hx of a fib. Per ems pt did not want to come to hospital so community paramedic obtained ivc paperwork. Pt denies any chest pain or dizziness. Pt states she has no complaints. Pts HR 145 a fib on arrival.

## 2020-02-27 ENCOUNTER — Observation Stay: Payer: Medicare Other

## 2020-02-27 ENCOUNTER — Observation Stay (HOSPITAL_COMMUNITY)
Admit: 2020-02-27 | Discharge: 2020-02-27 | Disposition: A | Discharge status: Home / Self Care | Payer: Medicare Other | Attending: Internal Medicine | Admitting: Internal Medicine

## 2020-02-27 DIAGNOSIS — Z20822 Contact with and (suspected) exposure to covid-19: Secondary | ICD-10-CM | POA: Diagnosis present

## 2020-02-27 DIAGNOSIS — F0281 Dementia in other diseases classified elsewhere with behavioral disturbance: Secondary | ICD-10-CM | POA: Diagnosis not present

## 2020-02-27 DIAGNOSIS — E538 Deficiency of other specified B group vitamins: Secondary | ICD-10-CM | POA: Diagnosis present

## 2020-02-27 DIAGNOSIS — F0280 Dementia in other diseases classified elsewhere without behavioral disturbance: Secondary | ICD-10-CM | POA: Diagnosis not present

## 2020-02-27 DIAGNOSIS — F0390 Unspecified dementia without behavioral disturbance: Secondary | ICD-10-CM

## 2020-02-27 DIAGNOSIS — F32A Depression, unspecified: Secondary | ICD-10-CM | POA: Diagnosis present

## 2020-02-27 DIAGNOSIS — F0391 Unspecified dementia with behavioral disturbance: Secondary | ICD-10-CM | POA: Diagnosis present

## 2020-02-27 DIAGNOSIS — Z8249 Family history of ischemic heart disease and other diseases of the circulatory system: Secondary | ICD-10-CM | POA: Diagnosis not present

## 2020-02-27 DIAGNOSIS — I340 Nonrheumatic mitral (valve) insufficiency: Secondary | ICD-10-CM | POA: Diagnosis not present

## 2020-02-27 DIAGNOSIS — G301 Alzheimer's disease with late onset: Secondary | ICD-10-CM | POA: Diagnosis not present

## 2020-02-27 DIAGNOSIS — I4891 Unspecified atrial fibrillation: Secondary | ICD-10-CM | POA: Diagnosis present

## 2020-02-27 DIAGNOSIS — K029 Dental caries, unspecified: Secondary | ICD-10-CM | POA: Diagnosis present

## 2020-02-27 DIAGNOSIS — I10 Essential (primary) hypertension: Secondary | ICD-10-CM | POA: Diagnosis present

## 2020-02-27 DIAGNOSIS — K047 Periapical abscess without sinus: Secondary | ICD-10-CM | POA: Diagnosis not present

## 2020-02-27 DIAGNOSIS — Z7982 Long term (current) use of aspirin: Secondary | ICD-10-CM | POA: Diagnosis not present

## 2020-02-27 DIAGNOSIS — I480 Paroxysmal atrial fibrillation: Secondary | ICD-10-CM

## 2020-02-27 DIAGNOSIS — Z79899 Other long term (current) drug therapy: Secondary | ICD-10-CM | POA: Diagnosis not present

## 2020-02-27 DIAGNOSIS — R4189 Other symptoms and signs involving cognitive functions and awareness: Secondary | ICD-10-CM | POA: Diagnosis present

## 2020-02-27 LAB — BASIC METABOLIC PANEL
Anion gap: 9 (ref 5–15)
BUN: 12 mg/dL (ref 8–23)
CO2: 25 mmol/L (ref 22–32)
Calcium: 9.4 mg/dL (ref 8.9–10.3)
Chloride: 105 mmol/L (ref 98–111)
Creatinine, Ser: 0.74 mg/dL (ref 0.44–1.00)
GFR, Estimated: >60 mL/min (ref >60)
Glucose, Bld: 170 mg/dL — ABNORMAL HIGH (ref 70–99)
Potassium: 3.8 mmol/L (ref 3.5–5.1)
Sodium: 139 mmol/L (ref 135–145)

## 2020-02-27 LAB — URINE DRUG SCREEN, QUALITATIVE (ARMC ONLY)
Amphetamines, Ur Screen: NOT DETECTED
Barbiturates, Ur Screen: NOT DETECTED
Benzodiazepine, Ur Scrn: NOT DETECTED
Cannabinoid 50 Ng, Ur ~~LOC~~: NOT DETECTED
Cocaine Metabolite,Ur ~~LOC~~: NOT DETECTED
MDMA (Ecstasy)Ur Screen: NOT DETECTED
Methadone Scn, Ur: NOT DETECTED
Opiate, Ur Screen: NOT DETECTED
Phencyclidine (PCP) Ur S: NOT DETECTED
Tricyclic, Ur Screen: NOT DETECTED

## 2020-02-27 LAB — URINALYSIS, COMPLETE (UACMP) WITH MICROSCOPIC
Bacteria, UA: NONE SEEN
Bilirubin Urine: NEGATIVE
Glucose, UA: NEGATIVE mg/dL
Hgb urine dipstick: NEGATIVE
Ketones, ur: 5 mg/dL — AB
Nitrite: NEGATIVE
Protein, ur: NEGATIVE mg/dL
Specific Gravity, Urine: 1.016 (ref 1.005–1.030)
WBC, UA: >50 WBC/hpf — ABNORMAL HIGH (ref 0–5)
pH: 7 (ref 5.0–8.0)

## 2020-02-27 LAB — ECHOCARDIOGRAM COMPLETE
AR max vel: 2.69 cm2
AV Area VTI: 2.68 cm2
AV Area mean vel: 2.38 cm2
AV Mean grad: 3 mmHg
AV Peak grad: 5.4 mmHg
Ao pk vel: 1.17 m/s
Area-P 1/2: 3 cm2
Height: 60 in
S' Lateral: 2.31 cm
Weight: 2007.07 oz

## 2020-02-27 LAB — CBC
HCT: 41.8 % (ref 36.0–46.0)
Hemoglobin: 14.1 g/dL (ref 12.0–15.0)
MCH: 29.6 pg (ref 26.0–34.0)
MCHC: 33.7 g/dL (ref 30.0–36.0)
MCV: 87.6 fL (ref 80.0–100.0)
Platelets: 261 10*3/uL (ref 150–400)
RBC: 4.77 MIL/uL (ref 3.87–5.11)
RDW: 13.7 % (ref 11.5–15.5)
WBC: 7.9 10*3/uL (ref 4.0–10.5)
nRBC: 0 % (ref 0.0–0.2)

## 2020-02-27 LAB — TROPONIN I (HIGH SENSITIVITY): Troponin I (High Sensitivity): 7 ng/L (ref <18)

## 2020-02-27 LAB — TSH: TSH: 1.535 u[IU]/mL (ref 0.350–4.500)

## 2020-02-27 MED ORDER — LORAZEPAM 2 MG/ML IJ SOLN
0.5000 mg | Freq: Once | INTRAMUSCULAR | Status: AC
  Administered 2020-02-27: 0.5 mg via INTRAVENOUS
  Filled 2020-02-27: qty 1

## 2020-02-27 MED ORDER — HALOPERIDOL LACTATE 5 MG/ML IJ SOLN
1.0000 mg | Freq: Once | INTRAMUSCULAR | Status: AC
  Administered 2020-02-27: 1 mg via INTRAMUSCULAR
  Filled 2020-02-27: qty 1

## 2020-02-27 MED ORDER — AMLODIPINE BESYLATE 5 MG PO TABS: 5.0000 mg | ORAL_TABLET | Freq: Every day | ORAL | Status: DC

## 2020-02-27 NOTE — ED Notes (Signed)
Pt out of room, asking for her care giver. Pt redirected to room, Pt complied

## 2020-02-27 NOTE — ED Notes (Signed)
Pt asleep, responds to verbal stimuli, informed patient of need to draw labs.  Patient refused, stating that she wasn't doing anymore test, that she wanted to go home.

## 2020-02-27 NOTE — ED Notes (Signed)
Pt attempting to get OOB. States " my caregiver told me he is coming to get me, I do not want to be hooked up to anything." Pt refusing monitors, pt refusing lunch. Pt agitated. Pt helped back to bed, given verbal reassurance, pt TV turned on for distraction.

## 2020-02-27 NOTE — ED Notes (Signed)
Pt refusing VS at this time. Will continue to monitor

## 2020-02-27 NOTE — ED Notes (Signed)
Pt consented to go to MRI, once there pt stated that her caregiver "over there" said she should not get it. Pt refusing MRI
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
## 2020-02-27 NOTE — ED Notes (Signed)
Pt agrees to go for head CT.  Off floor to scanner.  HR continues NSR.  MD aware.

## 2020-02-27 NOTE — ED Notes (Signed)
Pt resting in bed with eyes closed, rise and fall of chest noted. No distress noted, call light in reach.

## 2020-02-27 NOTE — ED Notes (Signed)
Pt sleeping, this RN able to reconnect monitors.

## 2020-02-27 NOTE — ED Notes (Signed)
Patient refusing to leave ER for MRI.  MD at bedside to explain reasons again to patient who then agrees.  Once arrival in MRI department received call from [place] that pt is now refusing to go into scanner.  She keeps stating, "I've been here long enough today, I'm going home, I'm not having any more test done", patient continues to state that she is not being admitted to hospital.  NP aware, awaiting orders.

## 2020-02-27 NOTE — Consult Note (Signed)
[facility] Psychiatry Consult   Reason for Consult: Consult for this 74 year old woman brought to the hospital under IVC because of inability to make appropriate medical decisions Referring Physician: Joni Fears Patient Identification: Charlotte Lane MRN:  [id number] Principal Diagnosis: Dementia [facility]) Diagnosis:  Principal Problem:   Dementia ([place]) Active Problems:   HTN (hypertension)   Paroxysmal atrial fibrillation with rapid ventricular response ([place])   Altered mental status   Total Time spent with patient: 1 hour  Subjective:   Charlotte Lane is a 74 y.o. female patient admitted with "I really do not know".  HPI: Patient seen chart reviewed.  74 year old woman was brought to the hospital today.  Apparently someone went to her home for some reason today to do a "wellness check" and found her to be in atrial fibrillation.  They contacted a doctor and it was recommended that she brought to the emergency room but she refused it.  Based on that involuntary commitment papers were filed.  I met with the patient and her emergency room room.  She was awake alert and generally cooperative.  Patient knew she was in the hospital but got the town wrong.  Knew the correct year but got the month wrong.  Patient remembered vaguely being brought to the hospital by an ambulance but was not clear exactly why she was brought and had no memory of any when explaining what had been found so far or why she was still here.  Documentation shows that she had refused an MRI and refused Lovenox so far but she had no memory of those things.  Patient admits to have recently been feeling confused.  She tells me that she has not been living at her normal home but has been living at some kind of [place].  She says she has been staying with "some people".  The whole thing is extremely vague.  Later she tells me she thinks that these people are giving her drugs because she has been thinking so  unclearly.  She denies any suicidal or homicidal ideation.  Patient thinks that she is being given drugs but denies that she has been drinking or using any drugs recently.  She admits that she is not taking her prescription medicine because she says she cannot find it.  On direct evaluation patient was confused as noted above and while she could repeat 3 words she could not remember any of them or even remember having been asked after a few minutes.  She denies any suicidal or homicidal thoughts but even after I explained in the most simple way possible with the most clear-cut pros and cons why she was being recommended for admission to the hospital patient said she could not make that decision without talking to her brother.  She claimed to believe that her brother was right outside her room.  There is no record or report from any staff that her brother has been anywhere near the hospital or spoken with her today but she would not believe it.  Patient ultimately told me she could not agree to be admitted to the hospital because she had "things to take care of"  Past Psychiatric History: No known past psychiatric history that I can identify.  No past psychiatric hospitalization.  Unclear whether anyone had picked up on dementia in the past.  Risk to Self:   Risk to Others:   Prior Inpatient Therapy:   Prior Outpatient Therapy:    Past Medical  History:  Past Medical History:  Diagnosis Date  . Hypertension     Past Surgical History:  Procedure Laterality Date  . CYST EXCISION Right 03/06/2019   Procedure: EXCISION SEBACEOUS CYST SCALP;  Surgeon: Aviva Signs, MD;  Location: [facility];  Service: General;  Laterality: Right;  . NO PAST SURGERIES     Family History:  Family History  Problem Relation Age of Onset  . Heart attack Father    Family Psychiatric  History: None known Social History:  Social History   Substance and Sexual Activity  Alcohol Use No     Social History   Substance and  Sexual Activity  Drug Use No    Social History   Socioeconomic History  . Marital status: Single    Spouse name: Not on file  . Number of children: Not on file  . Years of education: Not on file  . Highest education level: Not on file  Occupational History  . Not on file  Tobacco Use  . Smoking status: Never Smoker  . Smokeless tobacco: Never Used  Vaping Use  . Vaping Use: Never used  Substance and Sexual Activity  . Alcohol use: No  . Drug use: No  . Sexual activity: Not on file  Other Topics Concern  . Not on file  Social History Narrative  . Not on file   Social Determinants of Health   Financial Resource Strain:   . Difficulty of Paying Living Expenses: Not on file  Food Insecurity:   . Worried About Charity fundraiser in the Last Year: Not on file  . Ran Out of Food in the Last Year: Not on file  Transportation Needs:   . Lack of Transportation (Medical): Not on file  . Lack of Transportation (Non-Medical): Not on file  Physical Activity:   . Days of Exercise per Week: Not on file  . Minutes of Exercise per Session: Not on file  Stress:   . Feeling of Stress : Not on file  Social Connections:   . Frequency of Communication with Friends and Family: Not on file  . Frequency of Social Gatherings with Friends and Family: Not on file  . Attends Religious Services: Not on file  . Active Member of Clubs or Organizations: Not on file  . Attends Archivist Meetings: Not on file  . Marital Status: Not on file   Additional Social History:    Allergies:  No Known Allergies  Labs:  Results for orders placed or performed during the hospital encounter of 02/26/20 (from the past 48 hour(s))  CBC with Differential     Status: Abnormal   Collection Time: 02/26/20  8:12 PM  Result Value Ref Range   WBC 12.2 (H) 4.0 - 10.5 K/uL   RBC 5.25 (H) 3.87 - 5.11 MIL/uL   Hemoglobin 15.4 (H) 12.0 - 15.0 g/dL   HCT 46.9 (H) 36 - 46 %   MCV 89.3 80.0 - 100.0 fL    MCH 29.3 26.0 - 34.0 pg   MCHC 32.8 30.0 - 36.0 g/dL   RDW 13.9 11.5 - 15.5 %   Platelets 296 150 - 400 K/uL   nRBC 0.0 0.0 - 0.2 %   Neutrophils Relative % 55 %   Neutro Abs 6.8 1.7 - 7.7 K/uL   Lymphocytes Relative 31 %   Lymphs Abs 3.8 0.7 - 4.0 K/uL   Monocytes Relative 11 %   Monocytes Absolute 1.3 (H) 0.1 - 1.0 K/uL  Eosinophils Relative 2 %   Eosinophils Absolute 0.2 0.0 - 0.5 K/uL   Basophils Relative 1 %   Basophils Absolute 0.1 0.0 - 0.1 K/uL   Immature Granulocytes 0 %   Abs Immature Granulocytes 0.05 0.00 - 0.07 K/uL    Comment: Performed at [facility], [address] [place], [place] [address]  Comprehensive metabolic panel     Status: Abnormal   Collection Time: 02/26/20  8:12 PM  Result Value Ref Range   Sodium 141 135 - 145 mmol/L   Potassium 3.6 3.5 - 5.1 mmol/L   Chloride 106 98 - 111 mmol/L   CO2 27 22 - 32 mmol/L   Glucose, Bld 100 (H) 70 - 99 mg/dL    Comment: Glucose reference range applies only to samples taken after fasting for at least 8 hours.   BUN 16 8 - 23 mg/dL   Creatinine, Ser 0.95 0.44 - 1.00 mg/dL   Calcium 9.3 8.9 - 10.3 mg/dL   Total Protein 7.3 6.5 - 8.1 g/dL   Albumin 4.0 3.5 - 5.0 g/dL   AST 17 15 - 41 U/L   ALT 12 0 - 44 U/L   Alkaline Phosphatase 90 38 - 126 U/L   Total Bilirubin 0.9 0.3 - 1.2 mg/dL   GFR, Estimated >60 >60 mL/min    Comment: (NOTE) Calculated using the CKD-EPI Creatinine Equation (2021)    Anion gap 8 5 - 15    Comment: Performed at [facility], [address], [place], [place] [address]  Magnesium     Status: None   Collection Time: 02/26/20  8:12 PM  Result Value Ref Range   Magnesium 2.2 1.7 - 2.4 mg/dL    Comment: Performed at [facility], [address] [place], [place] [address]  Lactic acid, plasma     Status: None   Collection Time: 02/26/20  8:12 PM  Result Value Ref Range   Lactic Acid, Venous 1.4 0.5 - 1.9 mmol/L    Comment: Performed at [facility],  [address] [place], [place] [address]  Troponin I (High Sensitivity)     Status: None   Collection Time: 02/26/20  8:12 PM  Result Value Ref Range   Troponin I (High Sensitivity) 12 <18 ng/L    Comment: (NOTE) Elevated high sensitivity troponin I (hsTnI) values and significant  changes across serial measurements may suggest ACS but many other  chronic and acute conditions are known to elevate hsTnI results.  Refer to the "Links" section for chest pain algorithms and additional  guidance. Performed at [facility], [address] [place], [place] [address]   Respiratory Panel by RT PCR (Flu A&B, Covid) - Nasopharyngeal Swab     Status: None   Collection Time: 02/26/20  8:12 PM   Specimen: Nasopharyngeal Swab  Result Value Ref Range   SARS Coronavirus 2 by RT PCR NEGATIVE NEGATIVE    Comment: (NOTE) SARS-CoV-2 target nucleic acids are NOT DETECTED.  The SARS-CoV-2 RNA is generally detectable in upper respiratoy specimens during the acute phase of infection. The lowest concentration of SARS-CoV-2 viral copies this assay can detect is 131 copies/mL. A negative result does not preclude SARS-Cov-2 infection and should not be used as the sole basis for treatment or other patient management decisions. A negative result may occur with  improper specimen collection/handling, submission of specimen other than nasopharyngeal swab, presence of viral mutation(s) within the areas targeted by this assay, and inadequate number of  viral copies (<131 copies/mL). A negative result must be combined with clinical observations, patient history, and epidemiological information. The expected result is Negative.  Fact Sheet for Patients:  PinkCheek.be  Fact Sheet for Healthcare Providers:  GravelBags.it  This test is no t yet approved or cleared by the [place] FDA and  has been authorized for detection and/or diagnosis of  SARS-CoV-2 by FDA under an Emergency Use Authorization (EUA). This EUA will remain  in effect (meaning this test can be used) for the duration of the COVID-19 declaration under Section 564(b)(1) of the Act, 21 U.S.C. section 360bbb-3(b)(1), unless the authorization is terminated or revoked sooner.     Influenza A by PCR NEGATIVE NEGATIVE   Influenza B by PCR NEGATIVE NEGATIVE    Comment: (NOTE) The Xpert Xpress SARS-CoV-2/FLU/RSV assay is intended as an aid in  the diagnosis of influenza from Nasopharyngeal swab specimens and  should not be used as a sole basis for treatment. Nasal washings and  aspirates are unacceptable for Xpert Xpress SARS-CoV-2/FLU/RSV  testing.  Fact Sheet for Patients: PinkCheek.be  Fact Sheet for Healthcare Providers: GravelBags.it  This test is not yet approved or cleared by the [place] FDA and  has been authorized for detection and/or diagnosis of SARS-CoV-2 by  FDA under an Emergency Use Authorization (EUA). This EUA will remain  in effect (meaning this test can be used) for the duration of the  Covid-19 declaration under Section 564(b)(1) of the Act, 21  U.S.C. section 360bbb-3(b)(1), unless the authorization is  terminated or revoked. Performed at [facility], [address] [place], [place] [address]     Current Facility-Administered Medications  Medication Dose Route Frequency Provider Last Rate Last Admin  . acetaminophen (TYLENOL) tablet 650 mg  650 mg Oral Q6H PRN Lenore Cordia, MD       Or  . acetaminophen (TYLENOL) suppository 650 mg  650 mg Rectal Q6H PRN Zada Finders R, MD      . enoxaparin (LOVENOX) injection 40 mg  40 mg Subcutaneous Q24H Patel, Vishal R, MD      . metoprolol tartrate (LOPRESSOR) tablet 12.5 mg  12.5 mg Oral BID Zada Finders R, MD   12.5 mg at 02/27/20 1120  . sodium chloride flush (NS) 0.9 % injection 3 mL  3 mL Intravenous Q12H Lenore Cordia, MD   3 mL at 02/26/20 2345   Current Outpatient Medications  Medication Sig Dispense Refill  . amLODipine (NORVASC) 5 MG tablet Take 1 tablet (5 mg total) by mouth daily. 30 tablet 0  . lisinopril (ZESTRIL) 20 MG tablet Take 20 mg by mouth daily.    Marland Kitchen OLANZapine (ZYPREXA) 2.5 MG tablet Take 2.5 mg by mouth at bedtime.      Musculoskeletal: Strength & Muscle Tone: within normal limits Gait & Station: normal Patient leans: N/A  Psychiatric Specialty Exam: Physical Exam Vitals and nursing note reviewed.  Constitutional:      Appearance: She is well-developed.  HENT:     Head: Normocephalic and atraumatic.  Eyes:     Conjunctiva/sclera: Conjunctivae normal.     Pupils: Pupils are equal, round, and reactive to light.  Cardiovascular:     Heart sounds: Normal heart sounds.  Pulmonary:     Effort: Pulmonary effort is normal.  Abdominal:     Palpations: Abdomen is soft.  Musculoskeletal:        General: Normal range of motion.     Cervical back: Normal range of motion.  Skin:    General: Skin is warm and dry.  Neurological:     General: No focal deficit present.     Mental Status: She is alert.  Psychiatric:        Attention and Perception: She is inattentive.        Mood and Affect: Mood is anxious. Affect is inappropriate.        Speech: Speech is delayed and tangential.        Behavior: Behavior is agitated. Behavior is not aggressive or hyperactive.        Thought Content: Thought content is paranoid and delusional. Thought content does not include homicidal or suicidal ideation.        Cognition and Memory: Cognition is impaired. Memory is impaired.        Judgment: Judgment is inappropriate.     Review of Systems  Constitutional: Negative.   HENT: Negative.   Eyes: Negative.   Respiratory: Negative.   Cardiovascular: Negative.   Gastrointestinal: Negative.   Musculoskeletal: Negative.   Skin: Negative.   Neurological: Negative.    Psychiatric/Behavioral: Positive for confusion. The patient is nervous/anxious.     Blood pressure (!) 173/83, pulse 85, temperature 98 F (36.7 C), temperature source Oral, resp. rate 18, height 5' (1.524 m), weight 56.9 kg, SpO2 99 %.Body mass index is 24.5 kg/m.  General Appearance: Guarded  Eye Contact:  Minimal  Speech:  Slow  Volume:  Decreased  Mood:  Anxious  Affect:  Constricted  Thought Process:  Disorganized  Orientation:  Negative  Thought Content:  Illogical, Paranoid Ideation and Rumination  Suicidal Thoughts:  No  Homicidal Thoughts:  No  Memory:  Immediate;   Fair Recent;   Poor Remote;   Poor  Judgement:  Poor  Insight:  Lacking  Psychomotor Activity:  Decreased  Concentration:  Concentration: Poor  Recall:  Poor  Fund of Knowledge:  Poor  Language:  Poor  Akathisia:  No  Handed:  Right  AIMS (if indicated):     Assets:  Housing  ADL's:  Impaired  Cognition:  Impaired,  Mild  Sleep:        Treatment Plan Summary: Plan This is a 74 year old woman without a clear past psychiatric history.  To examination today she has signs of memory impairment confusion paranoia and some psychotic symptoms.  Wide differential diagnosis including medical problems that could be causing mild transient delirium, substance use, also likely some degree of dementia.  In her current condition the patient is not capable of making rational decisions about her own care.  I base this on her being unable to justify her decisions and her rationales for things being based on what appears to be psychotic beliefs.  Patient will be kept under IVC and I have filled out a second opinion on the form.  We will follow as needed.  No clear medication required at this point although if she becomes agitated she may need medicine for that such as antipsychotics.  I wanted to reach out to some family and get information but there is no contact listed in the chart.  There is a mention of someone talking to  a friend of hers in the chart but that does not seem to be a relative.  Disposition: Recommend psychiatric Inpatient admission when medically cleared.  Alethia Berthold, MD 02/27/2020 12:05 PM

## 2020-02-27 NOTE — ED Notes (Signed)
Echo tech at bedside.

## 2020-02-27 NOTE — ED Notes (Signed)
Pt helped to toilet, pt dizzy when standing up. Pt walked with one assist. Pt helped back to bed

## 2020-02-27 NOTE — ED Notes (Signed)
Attempted to give patient lovenox injection.  She continues to agree to medications and then refuses once they are brought to bedside.  MD aware.

## 2020-02-27 NOTE — ED Notes (Signed)
IVC, to be admitted medically

## 2020-02-27 NOTE — ED Notes (Signed)
Attempted to offer patient lovenox injection once again and she continues to refuse stating, I'm not taking any shots, because I'm not supposed to be down here".  MD aware.

## 2020-02-27 NOTE — ED Notes (Signed)
Pt refusing VS

## 2020-02-27 NOTE — ED Notes (Signed)
Pt placed in gown, new non skid socks, belongings bagged and labelled, One pair of blue jeans, black belt, nike slides, yellow non skid socks, one pair each underwear and long underwear, 2 t shirts, one blue, one gray, one hair clip. Pt denies presence of wallet, ID or jewelry, none noted.

## 2020-02-27 NOTE — ED Notes (Signed)
Pt agitated during medication administration. Police in [place]

## 2020-02-27 NOTE — ED Notes (Signed)
Dr.Clapacs at bedside  

## 2020-02-27 NOTE — ED Notes (Signed)
Pt refusing MRI, states " I am waiting for my caregiver" Pt also states she was living with her brother in a cabin but now has no where to go. Pt asked if she is homeless, pt denies. Pt oriented to situation but rambles at times, this RN unsure of validity of comments. Pt encouraged to go to MRI, pt agreeable at this time.

## 2020-02-27 NOTE — Progress Notes (Signed)
*  PRELIMINARY RESULTS* Echocardiogram 2D Echocardiogram has been performed.  Charlotte Lane 02/27/2020, 10:36 AM

## 2020-02-27 NOTE — ED Notes (Signed)
Pt in chair, meal given, this RN at bedside for safety

## 2020-02-27 NOTE — Progress Notes (Signed)
PROGRESS NOTE    Charlotte Lane  IWL:[id number] DOB: Aug 11, 1945 DOA: 02/26/2020 PCP: [facility]    Brief Narrative:   presents to the ED under IVC via EMS for evaluation of A. fib with RVR.   10/28- psych consulted for competency   Consultants:   Procedures:   Antimicrobials:       Subjective: Calm for me. Confused. No complaints  Objective: Vitals:   02/26/20 2101 02/27/20 0343 02/27/20 0400 02/27/20 0617  BP: (!) 141/72 (!) 167/73  (!) 153/73  Pulse:  73  75  Resp: 17 18  18   Temp:  98.7 F (37.1 C)  97.8 F (36.6 C)  TempSrc:  Oral  Oral  SpO2:      Weight:  56.9 kg 56.9 kg 56.9 kg  Height:  5' (1.524 m) 5' (1.524 m) 5' (1.524 m)    Intake/Output Summary (Last 24 hours) at 02/27/2020 0853 Last data filed at 02/26/2020 2345 Gross per 24 hour  Intake 3 ml  Output --  Net 3 ml   Filed Weights   02/27/20 0343 02/27/20 0400 02/27/20 0617  Weight: 56.9 kg 56.9 kg 56.9 kg    Examination:  General exam: Appears calm and comfortable  Respiratory system: Clear to auscultation. Respiratory effort normal. Cardiovascular system: S1 & S2 heard, RRR. No JVD, murmurs, rubs, gallops or clicks. No pedal edema. Gastrointestinal system: Abdomen is nondistended, soft and nontender.. Normal bowel sounds heard. Central nervous system: Alert , awake, confused Extremities: no edema Skin:warm, dry Psychiatry:unable to assess as she is confused but calm    Data Reviewed: I have personally reviewed following labs and imaging studies  CBC: Recent Labs  Lab 02/26/20 2012  WBC 12.2*  NEUTROABS 6.8  HGB 15.4*  HCT 46.9*  MCV 89.3  PLT 174   Basic Metabolic Panel: Recent Labs  Lab 02/26/20 2012  NA 141  K 3.6  CL 106  CO2 27  GLUCOSE 100*  BUN 16  CREATININE 0.95  CALCIUM 9.3  MG 2.2   GFR: Estimated Creatinine Clearance: 41.7 mL/min (by C-G formula based on SCr of 0.95 mg/dL). Liver Function Tests: Recent Labs  Lab  02/26/20 2012  AST 17  ALT 12  ALKPHOS 90  BILITOT 0.9  PROT 7.3  ALBUMIN 4.0   No results for input(s): LIPASE, AMYLASE in the last 168 hours. No results for input(s): AMMONIA in the last 168 hours. Coagulation Profile: No results for input(s): INR, PROTIME in the last 168 hours. Cardiac Enzymes: No results for input(s): CKTOTAL, CKMB, CKMBINDEX, TROPONINI in the last 168 hours. BNP (last 3 results) No results for input(s): PROBNP in the last 8760 hours. HbA1C: No results for input(s): HGBA1C in the last 72 hours. CBG: No results for input(s): GLUCAP in the last 168 hours. Lipid Profile: No results for input(s): CHOL, HDL, LDLCALC, TRIG, CHOLHDL, LDLDIRECT in the last 72 hours. Thyroid Function Tests: No results for input(s): TSH, T4TOTAL, FREET4, T3FREE, THYROIDAB in the last 72 hours. Anemia Panel: No results for input(s): VITAMINB12, FOLATE, FERRITIN, TIBC, IRON, RETICCTPCT in the last 72 hours. Sepsis Labs: Recent Labs  Lab 02/26/20 2012  LATICACIDVEN 1.4    Recent Results (from the past 240 hour(s))  Respiratory Panel by RT PCR (Flu A&B, Covid) - Nasopharyngeal Swab     Status: None   Collection Time: 02/26/20  8:12 PM   Specimen: Nasopharyngeal Swab  Result Value Ref Range Status   SARS Coronavirus 2 by RT PCR  NEGATIVE NEGATIVE Final    Comment: (NOTE) SARS-CoV-2 target nucleic acids are NOT DETECTED.  The SARS-CoV-2 RNA is generally detectable in upper respiratoy specimens during the acute phase of infection. The lowest concentration of SARS-CoV-2 viral copies this assay can detect is 131 copies/mL. A negative result does not preclude SARS-Cov-2 infection and should not be used as the sole basis for treatment or other patient management decisions. A negative result may occur with  improper specimen collection/handling, submission of specimen other than nasopharyngeal swab, presence of viral mutation(s) within the areas targeted by this assay, and inadequate  number of viral copies (<131 copies/mL). A negative result must be combined with clinical observations, patient history, and epidemiological information. The expected result is Negative.  Fact Sheet for Patients:  PinkCheek.be  Fact Sheet for Healthcare Providers:  GravelBags.it  This test is no t yet approved or cleared by the [place] FDA and  has been authorized for detection and/or diagnosis of SARS-CoV-2 by FDA under an Emergency Use Authorization (EUA). This EUA will remain  in effect (meaning this test can be used) for the duration of the COVID-19 declaration under Section 564(b)(1) of the Act, 21 U.S.C. section 360bbb-3(b)(1), unless the authorization is terminated or revoked sooner.     Influenza A by PCR NEGATIVE NEGATIVE Final   Influenza B by PCR NEGATIVE NEGATIVE Final    Comment: (NOTE) The Xpert Xpress SARS-CoV-2/FLU/RSV assay is intended as an aid in  the diagnosis of influenza from Nasopharyngeal swab specimens and  should not be used as a sole basis for treatment. Nasal washings and  aspirates are unacceptable for Xpert Xpress SARS-CoV-2/FLU/RSV  testing.  Fact Sheet for Patients: PinkCheek.be  Fact Sheet for Healthcare Providers: GravelBags.it  This test is not yet approved or cleared by the [place] FDA and  has been authorized for detection and/or diagnosis of SARS-CoV-2 by  FDA under an Emergency Use Authorization (EUA). This EUA will remain  in effect (meaning this test can be used) for the duration of the  Covid-19 declaration under Section 564(b)(1) of the Act, 21  U.S.C. section 360bbb-3(b)(1), unless the authorization is  terminated or revoked. Performed at [facility], [address], [place], [place] [address]          Radiology Studies: CT Head Wo Contrast  Result Date: 02/26/2020 CLINICAL DATA:   Mental status change EXAM: CT HEAD WITHOUT CONTRAST TECHNIQUE: Contiguous axial images were obtained from the base of the skull through the vertex without intravenous contrast. COMPARISON:  January 18, 2020 FINDINGS: Brain: No evidence of acute territorial infarction, hemorrhage, hydrocephalus,extra-axial collection or mass lesion/mass effect. There is dilatation the ventricles and sulci consistent with age-related atrophy. Low-attenuation changes in the deep white matter consistent with small vessel ischemia. Vascular: No hyperdense vessel or unexpected calcification. Skull: The skull is intact. No fracture or focal lesion identified. Sinuses/Orbits: The visualized paranasal sinuses and mastoid air cells are clear. The orbits and globes intact. Other: None IMPRESSION: No acute intracranial abnormality. Findings consistent with age related atrophy and chronic small vessel ischemia Electronically Signed   By: Prudencio Pair M.D.   On: 02/26/2020 21:27   DG Chest Portable 1 View  Result Date: 02/26/2020 CLINICAL DATA:  Altered mental status EXAM: PORTABLE CHEST 1 VIEW COMPARISON:  06/24/2015 FINDINGS: No new consolidation or edema. No pleural effusion. Stable cardiomediastinal contours. The visualized skeletal structures are unremarkable. IMPRESSION: No acute process in the chest. Electronically Signed   By: Addison Lank.D.  On: 02/26/2020 20:54        Scheduled Meds: . enoxaparin (LOVENOX) injection  40 mg Subcutaneous Q24H  . metoprolol tartrate  12.5 mg Oral BID  . sodium chloride flush  3 mL Intravenous Q12H   Continuous Infusions:  Assessment & Plan:   Principal Problem:   Paroxysmal atrial fibrillation with rapid ventricular response (HCC) Active Problems:   HTN (hypertension)   Altered mental status   Charlotte Lane is a 74 y.o. female with medical history significant for hypertension who is admitted with A. fib with RVR.  Paroxysmal atrial fibrillation with RVR: Initially  on Cardizem drip now switched to beta-blockers  Currently in sinus rhythm  Chads vas score is at least 3 however due to her cognitive status and not being competent gout we can start her on anticoagulation at this time  Echo pending    Question of altered mental status versus cognitive impairment/dementia: Brought in under IVC by EMS per patient's PCP due to concern of underlying dementia/cognitive impairment and inability to care for self per ED documentation.  No family/close contact available on admission to provide further history.  Per chart review, she is scheduled for a palliative care visit on 03/10/2020.  CT head is negative for acute changes on admission.  No evidence of infection.  Possible she may have had a CVA given her A. Fib. -Obtain MRI brain without contrast 10/28-normal.  MRI of brain pending Psychiatry was consulted to assess for competency-Per psych patient has signs of memory impairment confusion, paranoia and some psychotic symptoms in her current condition patient is not capable of making rational decision about her own care.  Patient will be kept under IVC.  If she becomes agitated she may need medicine for that such as antipsychotics per psychiatry TOC consulted    Hypertension: Allowing permissive hypertension until MRI is obtained to rule out stroke      DVT prophylaxis: Lovenox Code Status: Full Family Communication: None at bedside  Status LS:[id number]  The patient will require care spanning > 2 midnights and requires inpatient level of care because: Inpatient level of care appropriate due to severity of illness  Dispo: The patient is from: Home              Anticipated d/c is to: TBD              Anticipated d/c date is: 3 days              Patient currently is not medically stable to d/c.            LOS: 0 days   Time spent:35 min with >50% on coc    Nolberto Hanlon, MD [facility] Pager [phone]  If 7PM-7AM, please contact  night-coverage [email or web address] Password [facility] 02/27/2020, 8:53 AM

## 2020-02-27 NOTE — ED Notes (Signed)
Pt has gotten out of bed without assistance multiple times. Charge aware. No sitters available. Pt is not easily redirectable. MD paged

## 2020-02-27 NOTE — ED Notes (Signed)
Dr at bedside. Pt convinced family is "in the ED somewhere." This RN states her family is not here, however, we will try to get in touch with her.

## 2020-02-28 ENCOUNTER — Inpatient Hospital Stay: Payer: Medicare Other

## 2020-02-28 DIAGNOSIS — I10 Essential (primary) hypertension: Secondary | ICD-10-CM | POA: Diagnosis not present

## 2020-02-28 DIAGNOSIS — I480 Paroxysmal atrial fibrillation: Secondary | ICD-10-CM | POA: Diagnosis not present

## 2020-02-28 LAB — HEMOGLOBIN A1C
Hgb A1c MFr Bld: 5.9 % — ABNORMAL HIGH (ref 4.8–5.6)
Mean Plasma Glucose: 123 mg/dL

## 2020-02-28 MED ORDER — ENOXAPARIN SODIUM 60 MG/0.6ML ~~LOC~~ SOLN
0.5000 mg/kg | SUBCUTANEOUS | Status: DC
  Administered 2020-02-28 – 2020-03-15 (×17): 47.5 mg via SUBCUTANEOUS
  Filled 2020-02-28 (×17): qty 0.6

## 2020-02-28 MED ORDER — LORAZEPAM 2 MG/ML IJ SOLN
1.0000 mg | Freq: Once | INTRAMUSCULAR | Status: AC
  Administered 2020-02-28: 1 mg via INTRAVENOUS
  Filled 2020-02-28: qty 1

## 2020-02-28 MED ORDER — AMLODIPINE BESYLATE 5 MG PO TABS
2.5000 mg | ORAL_TABLET | Freq: Every day | ORAL | Status: DC
  Administered 2020-03-01: 2.5 mg via ORAL
  Filled 2020-02-28 (×2): qty 1

## 2020-02-28 NOTE — Progress Notes (Signed)
Attempted to complete pt's admission questions for the second time. Due to her disorientation, I am unable to complete all of her admission questions this morning. Pt is currently resting in the bed with no complaints. She slept the majority of the night and exhibited no agitation.

## 2020-02-28 NOTE — Consult Note (Signed)
  Follow-up consult note.  Came by to see patient today.  She was asleep so it did not seem worthwhile to wake her up.  Chart reviewed.  Sounds like she is still confused much of the time.  Probably will need social work intervention ultimately.  I am not going to change the commitment paperwork for now.

## 2020-02-28 NOTE — Plan of Care (Signed)
Continue with plan of care.  

## 2020-02-28 NOTE — Progress Notes (Signed)
PHARMACIST - PHYSICIAN COMMUNICATION  CONCERNING:  Enoxaparin (Lovenox) for DVT Prophylaxis    RECOMMENDATION: Patient was prescribed enoxaprin 40mg  q24 hours for VTE prophylaxis.   Filed Weights   02/27/20 0400 02/27/20 0617 02/27/20 2300  Weight: 56.9 kg (125 lb 7.1 oz) 56.9 kg (125 lb 7.1 oz) 95.4 kg (210 lb 5.1 oz)    Body mass index is 41.08 kg/m.  Estimated Creatinine Clearance: 64.8 mL/min (by C-G formula based on SCr of 0.74 mg/dL).   Based on [place] patient is candidate for enoxaparin 0.5mg /kg TBW SQ every 24 hours based on BMI being >30.   DESCRIPTION: Pharmacy has adjusted enoxaparin dose per [facility] policy.  Patient is now receiving enoxaparin 47.5 mg every 24 hours    Rocky Morel, PharmD Clinical Pharmacist  02/28/2020 10:17 AM

## 2020-02-28 NOTE — Progress Notes (Signed)
Notified NP of pt's SBP > than 160. Scheduled Metoprolol given and will continue to monitor. Received order for a sitter since pt is IVC. Also clarified with the charge nurse and [facility].

## 2020-02-28 NOTE — Progress Notes (Signed)
PROGRESS NOTE    Charlotte Lane  HYQ:[id number] DOB: 21-Jul-1945 DOA: 02/26/2020 PCP: [facility]    Brief Narrative:   presents to the ED under IVC via EMS for evaluation of A. fib with RVR.   10/28- psych consulted for competency 10/29-calmer today.MRI was not completed yesterday due to pts behavior/refusal/   Consultants:  Psychiatry  Procedures:   Antimicrobials:       Subjective: Calm this am. Sitter at bedside. Pt interactive but confused. Only oriented to date  Objective: Vitals:   02/27/20 2130 02/27/20 2245 02/27/20 2300 02/28/20 0332  BP: (!) 149/74 (!) 167/76  114/60  Pulse: 68 72  62  Resp: 14 20  18   Temp:  98.2 F (36.8 C)  97.7 F (36.5 C)  TempSrc:  Oral  Oral  SpO2: 98% 95%  96%  Weight:   95.4 kg   Height:        Intake/Output Summary (Last 24 hours) at 02/28/2020 0824 Last data filed at 02/28/2020 0600 Gross per 24 hour  Intake 0 ml  Output 400 ml  Net -400 ml   Filed Weights   02/27/20 0400 02/27/20 0617 02/27/20 2300  Weight: 56.9 kg 56.9 kg 95.4 kg    Examination: Calm, cooperative , lying in bed cta no r/w/r RRR, s1/s2 no murmurs Soft bening , +bs 5/5 strenghth, awake, alert, oriented only to date, no facial droop No edema      Data Reviewed: I have personally reviewed following labs and imaging studies  CBC: Recent Labs  Lab 02/26/20 2012 02/27/20 1219  WBC 12.2* 7.9  NEUTROABS 6.8  --   HGB 15.4* 14.1  HCT 46.9* 41.8  MCV 89.3 87.6  PLT 296 952   Basic Metabolic Panel: Recent Labs  Lab 02/26/20 2012 02/27/20 1219  NA 141 139  K 3.6 3.8  CL 106 105  CO2 27 25  GLUCOSE 100* 170*  BUN 16 12  CREATININE 0.95 0.74  CALCIUM 9.3 9.4  MG 2.2  --    GFR: Estimated Creatinine Clearance: 64.8 mL/min (by C-G formula based on SCr of 0.74 mg/dL). Liver Function Tests: Recent Labs  Lab 02/26/20 2012  AST 17  ALT 12  ALKPHOS 90  BILITOT 0.9  PROT 7.3  ALBUMIN 4.0   No  results for input(s): LIPASE, AMYLASE in the last 168 hours. No results for input(s): AMMONIA in the last 168 hours. Coagulation Profile: No results for input(s): INR, PROTIME in the last 168 hours. Cardiac Enzymes: No results for input(s): CKTOTAL, CKMB, CKMBINDEX, TROPONINI in the last 168 hours. BNP (last 3 results) No results for input(s): PROBNP in the last 8760 hours. HbA1C: Recent Labs    02/27/20 1219  HGBA1C 5.9*   CBG: No results for input(s): GLUCAP in the last 168 hours. Lipid Profile: No results for input(s): CHOL, HDL, LDLCALC, TRIG, CHOLHDL, LDLDIRECT in the last 72 hours. Thyroid Function Tests: Recent Labs    02/27/20 1219  TSH 1.535   Anemia Panel: No results for input(s): VITAMINB12, FOLATE, FERRITIN, TIBC, IRON, RETICCTPCT in the last 72 hours. Sepsis Labs: Recent Labs  Lab 02/26/20 2012  LATICACIDVEN 1.4    Recent Results (from the past 240 hour(s))  Respiratory Panel by RT PCR (Flu A&B, Covid) - Nasopharyngeal Swab     Status: None   Collection Time: 02/26/20  8:12 PM   Specimen: Nasopharyngeal Swab  Result Value Ref Range Status   SARS Coronavirus 2 by RT PCR  NEGATIVE NEGATIVE Final    Comment: (NOTE) SARS-CoV-2 target nucleic acids are NOT DETECTED.  The SARS-CoV-2 RNA is generally detectable in upper respiratoy specimens during the acute phase of infection. The lowest concentration of SARS-CoV-2 viral copies this assay can detect is 131 copies/mL. A negative result does not preclude SARS-Cov-2 infection and should not be used as the sole basis for treatment or other patient management decisions. A negative result may occur with  improper specimen collection/handling, submission of specimen other than nasopharyngeal swab, presence of viral mutation(s) within the areas targeted by this assay, and inadequate number of viral copies (<131 copies/mL). A negative result must be combined with clinical observations, patient history, and  epidemiological information. The expected result is Negative.  Fact Sheet for Patients:  PinkCheek.be  Fact Sheet for Healthcare Providers:  GravelBags.it  This test is no t yet approved or cleared by the [place] FDA and  has been authorized for detection and/or diagnosis of SARS-CoV-2 by FDA under an Emergency Use Authorization (EUA). This EUA will remain  in effect (meaning this test can be used) for the duration of the COVID-19 declaration under Section 564(b)(1) of the Act, 21 U.S.C. section 360bbb-3(b)(1), unless the authorization is terminated or revoked sooner.     Influenza A by PCR NEGATIVE NEGATIVE Final   Influenza B by PCR NEGATIVE NEGATIVE Final    Comment: (NOTE) The Xpert Xpress SARS-CoV-2/FLU/RSV assay is intended as an aid in  the diagnosis of influenza from Nasopharyngeal swab specimens and  should not be used as a sole basis for treatment. Nasal washings and  aspirates are unacceptable for Xpert Xpress SARS-CoV-2/FLU/RSV  testing.  Fact Sheet for Patients: PinkCheek.be  Fact Sheet for Healthcare Providers: GravelBags.it  This test is not yet approved or cleared by the [place] FDA and  has been authorized for detection and/or diagnosis of SARS-CoV-2 by  FDA under an Emergency Use Authorization (EUA). This EUA will remain  in effect (meaning this test can be used) for the duration of the  Covid-19 declaration under Section 564(b)(1) of the Act, 21  U.S.C. section 360bbb-3(b)(1), unless the authorization is  terminated or revoked. Performed at [facility], [address] [place], [place] [address]          Radiology Studies: CT Head Wo Contrast  Result Date: 02/26/2020 CLINICAL DATA:  Mental status change EXAM: CT HEAD WITHOUT CONTRAST TECHNIQUE: Contiguous axial images were obtained from the base of the skull  through the vertex without intravenous contrast. COMPARISON:  January 18, 2020 FINDINGS: Brain: No evidence of acute territorial infarction, hemorrhage, hydrocephalus,extra-axial collection or mass lesion/mass effect. There is dilatation the ventricles and sulci consistent with age-related atrophy. Low-attenuation changes in the deep white matter consistent with small vessel ischemia. Vascular: No hyperdense vessel or unexpected calcification. Skull: The skull is intact. No fracture or focal lesion identified. Sinuses/Orbits: The visualized paranasal sinuses and mastoid air cells are clear. The orbits and globes intact. Other: None IMPRESSION: No acute intracranial abnormality. Findings consistent with age related atrophy and chronic small vessel ischemia Electronically Signed   By: Prudencio Pair M.D.   On: 02/26/2020 21:27   DG Chest Portable 1 View  Result Date: 02/26/2020 CLINICAL DATA:  Altered mental status EXAM: PORTABLE CHEST 1 VIEW COMPARISON:  06/24/2015 FINDINGS: No new consolidation or edema. No pleural effusion. Stable cardiomediastinal contours. The visualized skeletal structures are unremarkable. IMPRESSION: No acute process in the chest. Electronically Signed   By: Addison Lank.D.  On: 02/26/2020 20:54   ECHOCARDIOGRAM COMPLETE  Result Date: 02/27/2020    ECHOCARDIOGRAM REPORT   Patient Name:   LILIAN FUHS Chap Date of Exam: 02/27/2020 Medical Rec #:  [id number]       Height:       60.0 in Accession #:    [id number]      Weight:       125.4 lb Date of Birth:  [birth year]      BSA:          1.531 m Patient Age:    36 years        BP:           153/73 mmHg Patient Gender: F               HR:           75 bpm. Exam Location:  [facility] Procedure: 2D Echo, Cardiac Doppler and Color Doppler Indications:     Atrial Fibrillation 427.31  History:         Patient has no prior history of Echocardiogram examinations.                  Risk Factors:Hypertension.  Sonographer:     JERRY Referring Phys:   [id number] Cleaster Corin PATEL Diagnosing Phys: Ida Rogue MD IMPRESSIONS  1. Left ventricular ejection fraction, by estimation, is 60 to 65%. The left ventricle has normal function. The left ventricle has no regional wall motion abnormalities. Left ventricular diastolic parameters are consistent with Grade I diastolic dysfunction (impaired relaxation).  2. Right ventricular systolic function is normal. The right ventricular size is normal. Tricuspid regurgitation signal is inadequate for assessing PA pressure.  3. The mitral valve is normal in structure. Mild mitral valve regurgitation. FINDINGS  Left Ventricle: Left ventricular ejection fraction, by estimation, is 60 to 65%. The left ventricle has normal function. The left ventricle has no regional wall motion abnormalities. The left ventricular internal cavity size was normal in size. There is  no left ventricular hypertrophy. Left ventricular diastolic parameters are consistent with Grade I diastolic dysfunction (impaired relaxation). Right Ventricle: The right ventricular size is normal. No increase in right ventricular wall thickness. Right ventricular systolic function is normal. Tricuspid regurgitation signal is inadequate for assessing PA pressure. Left Atrium: Left atrial size was normal in size. Right Atrium: Right atrial size was normal in size. Pericardium: There is no evidence of pericardial effusion. Mitral Valve: The mitral valve is normal in structure. Mild mitral valve regurgitation. No evidence of mitral valve stenosis. Tricuspid Valve: The tricuspid valve is normal in structure. Tricuspid valve regurgitation is not demonstrated. No evidence of tricuspid stenosis. Aortic Valve: The aortic valve is normal in structure. Aortic valve regurgitation is not visualized. No aortic stenosis is present. Aortic valve mean gradient measures 3.0 mmHg. Aortic valve peak gradient measures 5.4 mmHg. Aortic valve area, by VTI measures 2.68 cm. Pulmonic Valve: The  pulmonic valve was normal in structure. Pulmonic valve regurgitation is not visualized. No evidence of pulmonic stenosis. Aorta: The aortic root is normal in size and structure. Venous: The inferior vena cava is normal in size with greater than 50% respiratory variability, suggesting right atrial pressure of 3 mmHg. IAS/Shunts: No atrial level shunt detected by color flow Doppler.  LEFT VENTRICLE PLAX 2D LVIDd:         3.88 cm  Diastology LVIDs:         2.31 cm  LV e' medial:    3.92 cm/s LV  PW:         1.40 cm  LV E/e' medial:  18.8 LV IVS:        0.97 cm  LV e' lateral:   4.57 cm/s LVOT diam:     2.00 cm  LV E/e' lateral: 16.1 LV SV:         73 LV SV Index:   47 LVOT Area:     3.14 cm  RIGHT VENTRICLE RV Basal diam:  2.45 cm RV S prime:     12.70 cm/s TAPSE (M-mode): 3.2 cm LEFT ATRIUM             Index       RIGHT ATRIUM           Index LA diam:        3.30 cm 2.16 cm/m  RA Area:     10.10 cm LA Vol (A2C):   55.4 ml 36.19 ml/m RA Volume:   17.90 ml  11.69 ml/m LA Vol (A4C):   55.7 ml 36.38 ml/m LA Biplane Vol: 55.4 ml 36.19 ml/m  AORTIC VALVE                   PULMONIC VALVE AV Area (Vmax):    2.69 cm    PV Vmax:        0.74 m/s AV Area (Vmean):   2.38 cm    PV Peak grad:   2.2 mmHg AV Area (VTI):     2.68 cm    RVOT Peak grad: 3 mmHg AV Vmax:           116.50 cm/s AV Vmean:          85.750 cm/s AV VTI:            0.271 m AV Peak Grad:      5.4 mmHg AV Mean Grad:      3.0 mmHg LVOT Vmax:         99.80 cm/s LVOT Vmean:        65.000 cm/s LVOT VTI:          0.231 m LVOT/AV VTI ratio: 0.85  AORTA Ao Root diam: 2.80 cm MITRAL VALVE                TRICUSPID VALVE MV Area (PHT): 3.00 cm     TR Peak grad:   4.0 mmHg MV Decel Time: 253 msec     TR Vmax:        100.00 cm/s MV E velocity: 73.70 cm/s MV A velocity: 104.00 cm/s  SHUNTS MV E/A ratio:  0.71         Systemic VTI:  0.23 m                             Systemic Diam: 2.00 cm Ida Rogue MD Electronically signed by Ida Rogue MD Signature  Date/Time: 02/27/2020/12:38:47 PM    Final         Scheduled Meds: . enoxaparin (LOVENOX) injection  40 mg Subcutaneous Q24H  . metoprolol tartrate  12.5 mg Oral BID  . sodium chloride flush  3 mL Intravenous Q12H   Continuous Infusions:  Assessment & Plan:   Principal Problem:   Dementia (HCC) Active Problems:   HTN (hypertension)   Paroxysmal atrial fibrillation with rapid ventricular response ([place])   Altered mental status   Charlotte Lane is a 74 y.o. female with medical history significant for hypertension  who is admitted with A. fib with RVR.  Paroxysmal atrial fibrillation with RVR: Initially on Cardizem drip  switched to beta-blockers  10/29- in SR. Requires a/c based on her chadsvasc score however with her incompetency/confusion unable to place her on a/c at this time Echo nml EF.    Question of altered mental status versus cognitive impairment/dementia: Brought in under IVC by EMS per patient's PCP due to concern of underlying dementia/cognitive impairment and inability to care for self per ED documentation.  No family/close contact available on admission to provide further history.  Per chart review, she is scheduled for a palliative care visit on 03/10/2020.  CT head is negative for acute changes on admission.  No evidence of infection.  Possible she may have had a CVA given her A. Fib. Psychiatry was consulted to assess for competency-Per psych patient has signs of memory impairment confusion, paranoia and some psychotic symptoms in her current condition patient is not capable of making rational decision about her own care.  Patient will be kept under IVC.  If she becomes agitated she may need medicine for that such as antipsychotics per psychiatry 10/29- MRI pending . Discussed with pt the importance of obtaining this, she is agreeable, unsure if she quite comprehends what I am telling her. Will /fu MRI brain to r/o stroke     Hypertension: Allowed permissive  htn  Initially. Will add low dose amlodipine today.  Continue with beta blk      DVT prophylaxis: Lovenox Code Status: Full Family Communication: None at bedside  Status XN:[id number]  The patient will require care spanning > 2 midnights and requires inpatient level of care because: Inpatient level of care appropriate due to severity of illness  Dispo: The patient is from: Home              Anticipated d/c is to: TBD              Anticipated d/c date is: 3 days              Patient currently is not medically stable to d/c.            LOS: 1 day   Time spent:35 min with >50% on coc    Nolberto Hanlon, MD [facility] Pager [phone]  If 7PM-7AM, please contact night-coverage [email or web address] Password [facility] 02/28/2020, 8:24 AM

## 2020-02-29 DIAGNOSIS — F0280 Dementia in other diseases classified elsewhere without behavioral disturbance: Secondary | ICD-10-CM

## 2020-02-29 DIAGNOSIS — I10 Essential (primary) hypertension: Secondary | ICD-10-CM | POA: Diagnosis not present

## 2020-02-29 DIAGNOSIS — G301 Alzheimer's disease with late onset: Secondary | ICD-10-CM | POA: Diagnosis not present

## 2020-02-29 DIAGNOSIS — I480 Paroxysmal atrial fibrillation: Secondary | ICD-10-CM | POA: Diagnosis not present

## 2020-02-29 LAB — TSH: TSH: 0.938 u[IU]/mL (ref 0.350–4.500)

## 2020-02-29 LAB — RAPID HIV SCREEN (HIV 1/2 AB+AG)
HIV 1/2 Antibodies: NONREACTIVE
HIV-1 P24 Antigen - HIV24: NONREACTIVE

## 2020-02-29 LAB — FOLATE: Folate: 9.9 ng/mL (ref >5.9)

## 2020-02-29 LAB — AMMONIA: Ammonia: 26 umol/L (ref 9–35)

## 2020-02-29 LAB — VITAMIN B12: Vitamin B-12: 136 pg/mL — ABNORMAL LOW (ref 180–914)

## 2020-02-29 MED ORDER — THIAMINE HCL 100 MG/ML IJ SOLN
100.0000 mg | Freq: Every day | INTRAMUSCULAR | Status: DC
  Filled 2020-02-29 (×2): qty 2

## 2020-02-29 MED ORDER — THIAMINE HCL 100 MG PO TABS
100.0000 mg | ORAL_TABLET | Freq: Every day | ORAL | Status: DC
  Administered 2020-02-29 – 2020-03-16 (×17): 100 mg via ORAL
  Filled 2020-02-29 (×17): qty 1

## 2020-02-29 NOTE — Progress Notes (Addendum)
PROGRESS NOTE    ELFRIEDA ESPINO  RSW:[id number] DOB: 1945/05/11 DOA: 02/26/2020 PCP: [facility]    Brief Narrative:   presents to the ED under IVC via EMS for evaluation of A. fib with RVR.   10/28- psych consulted for competency 10/29-calmer today.MRI was not completed yesterday due to pts behavior/refusal/ 10/30-still remains confused, even after repeating to her multiple times where she is currently she is unable to tell me she is at hospital. Per psychiatry will not change commitment paperwork for now  Consultants:  Psychiatry  Procedures:   Antimicrobials:       Subjective: Calm, cooperative. confused  Objective: Vitals:   02/28/20 1934 02/28/20 2352 02/29/20 0516 02/29/20 0750  BP: (!) 141/67 117/69 (!) 137/55 (!) 143/66  Pulse: 63 66 61 (!) 59  Resp: 16 18 18 18   Temp: 97.8 F (36.6 C) 98.4 F (36.9 C) 98.2 F (36.8 C) 97.8 F (36.6 C)  TempSrc: Oral Oral Oral Oral  SpO2: 94% 98% 95% 94%  Weight:      Height:        Intake/Output Summary (Last 24 hours) at 02/29/2020 0804 Last data filed at 02/29/2020 0300 Gross per 24 hour  Intake 360 ml  Output 400 ml  Net -40 ml   Filed Weights   02/27/20 0400 02/27/20 0617 02/27/20 2300  Weight: 56.9 kg 56.9 kg 95.4 kg    Examination: Sitting up in bed, sitter at side.  Calm comfortable CTA no wheeze rales rhonchi's Regular S1-S2 no murmurs rubs gallops Abdomen soft nontender benign positive bowel sounds No edema Awake, oriented to time and person not to place.  Even repeating where she is located she still unable to tell me where she is currently.  Otherwise grossly intact     Data Reviewed: I have personally reviewed following labs and imaging studies  CBC: Recent Labs  Lab 02/26/20 2012 02/27/20 1219  WBC 12.2* 7.9  NEUTROABS 6.8  --   HGB 15.4* 14.1  HCT 46.9* 41.8  MCV 89.3 87.6  PLT 296 093   Basic Metabolic Panel: Recent Labs  Lab 02/26/20 2012  02/27/20 1219  NA 141 139  K 3.6 3.8  CL 106 105  CO2 27 25  GLUCOSE 100* 170*  BUN 16 12  CREATININE 0.95 0.74  CALCIUM 9.3 9.4  MG 2.2  --    GFR: Estimated Creatinine Clearance: 64.8 mL/min (by C-G formula based on SCr of 0.74 mg/dL). Liver Function Tests: Recent Labs  Lab 02/26/20 2012  AST 17  ALT 12  ALKPHOS 90  BILITOT 0.9  PROT 7.3  ALBUMIN 4.0   No results for input(s): LIPASE, AMYLASE in the last 168 hours. No results for input(s): AMMONIA in the last 168 hours. Coagulation Profile: No results for input(s): INR, PROTIME in the last 168 hours. Cardiac Enzymes: No results for input(s): CKTOTAL, CKMB, CKMBINDEX, TROPONINI in the last 168 hours. BNP (last 3 results) No results for input(s): PROBNP in the last 8760 hours. HbA1C: Recent Labs    02/27/20 1219  HGBA1C 5.9*   CBG: No results for input(s): GLUCAP in the last 168 hours. Lipid Profile: No results for input(s): CHOL, HDL, LDLCALC, TRIG, CHOLHDL, LDLDIRECT in the last 72 hours. Thyroid Function Tests: Recent Labs    02/27/20 1219  TSH 1.535   Anemia Panel: No results for input(s): VITAMINB12, FOLATE, FERRITIN, TIBC, IRON, RETICCTPCT in the last 72 hours. Sepsis Labs: Recent Labs  Lab 02/26/20 2012  LATICACIDVEN 1.4    Recent Results (from the past 240 hour(s))  Respiratory Panel by RT PCR (Flu A&B, Covid) - Nasopharyngeal Swab     Status: None   Collection Time: 02/26/20  8:12 PM   Specimen: Nasopharyngeal Swab  Result Value Ref Range Status   SARS Coronavirus 2 by RT PCR NEGATIVE NEGATIVE Final    Comment: (NOTE) SARS-CoV-2 target nucleic acids are NOT DETECTED.  The SARS-CoV-2 RNA is generally detectable in upper respiratoy specimens during the acute phase of infection. The lowest concentration of SARS-CoV-2 viral copies this assay can detect is 131 copies/mL. A negative result does not preclude SARS-Cov-2 infection and should not be used as the sole basis for treatment or other  patient management decisions. A negative result may occur with  improper specimen collection/handling, submission of specimen other than nasopharyngeal swab, presence of viral mutation(s) within the areas targeted by this assay, and inadequate number of viral copies (<131 copies/mL). A negative result must be combined with clinical observations, patient history, and epidemiological information. The expected result is Negative.  Fact Sheet for Patients:  PinkCheek.be  Fact Sheet for Healthcare Providers:  GravelBags.it  This test is no t yet approved or cleared by the [place] FDA and  has been authorized for detection and/or diagnosis of SARS-CoV-2 by FDA under an Emergency Use Authorization (EUA). This EUA will remain  in effect (meaning this test can be used) for the duration of the COVID-19 declaration under Section 564(b)(1) of the Act, 21 U.S.C. section 360bbb-3(b)(1), unless the authorization is terminated or revoked sooner.     Influenza A by PCR NEGATIVE NEGATIVE Final   Influenza B by PCR NEGATIVE NEGATIVE Final    Comment: (NOTE) The Xpert Xpress SARS-CoV-2/FLU/RSV assay is intended as an aid in  the diagnosis of influenza from Nasopharyngeal swab specimens and  should not be used as a sole basis for treatment. Nasal washings and  aspirates are unacceptable for Xpert Xpress SARS-CoV-2/FLU/RSV  testing.  Fact Sheet for Patients: PinkCheek.be  Fact Sheet for Healthcare Providers: GravelBags.it  This test is not yet approved or cleared by the [place] FDA and  has been authorized for detection and/or diagnosis of SARS-CoV-2 by  FDA under an Emergency Use Authorization (EUA). This EUA will remain  in effect (meaning this test can be used) for the duration of the  Covid-19 declaration under Section 564(b)(1) of the Act, 21  U.S.C. section  360bbb-3(b)(1), unless the authorization is  terminated or revoked. Performed at [facility], [address] [place], [place] [address]          Radiology Studies: MR BRAIN WO CONTRAST  Result Date: 02/28/2020 CLINICAL DATA:  Altered mental status EXAM: MRI HEAD WITHOUT CONTRAST TECHNIQUE: Multiplanar, multiecho pulse sequences of the brain and surrounding structures were obtained without intravenous contrast. COMPARISON:  None. FINDINGS: Brain: There is no acute infarction or intracranial hemorrhage. There is no intracranial mass, mass effect, or edema. There is no hydrocephalus or extra-axial fluid collection. Prominence of the ventricles and sulci reflects generalized parenchymal volume loss. Patchy T2 hyperintensity in the supratentorial white matter is nonspecific probably reflects mild to moderate chronic microvascular ischemic changes. Ill-defined susceptibility in the inferior left frontal lobe is difficult to localize exactly but appears to involve the anterior perforated substance. This likely reflects chronic blood products. Vascular: Major vessel flow voids at the skull base are preserved. Skull and upper cervical spine: Normal marrow signal is preserved. Sinuses/Orbits: Paranasal sinuses are aerated. Orbits  are unremarkable. Other: Sella is unremarkable.  Mastoid air cells are clear. IMPRESSION: No evidence recent infarction, hemorrhage, or mass. Mild to moderate chronic microvascular ischemic changes. Chronic blood products in the region of the left anterior perforated substance. Electronically Signed   By: Macy Mis M.D.   On: 02/28/2020 14:24   ECHOCARDIOGRAM COMPLETE  Result Date: 02/27/2020    ECHOCARDIOGRAM REPORT   Patient Name:   NATAYA BASTEDO Reininger Date of Exam: 02/27/2020 Medical Rec #:  [id number]       Height:       60.0 in Accession #:    [id number]      Weight:       125.4 lb Date of Birth:  1946/04/01      BSA:          1.531 m Patient Age:    87 years         BP:           153/73 mmHg Patient Gender: F               HR:           75 bpm. Exam Location:  [facility] Procedure: 2D Echo, Cardiac Doppler and Color Doppler Indications:     Atrial Fibrillation 427.31  History:         Patient has no prior history of Echocardiogram examinations.                  Risk Factors:Hypertension.  Sonographer:     JERRY Referring Phys:  [id number] Cleaster Corin PATEL Diagnosing Phys: Ida Rogue MD IMPRESSIONS  1. Left ventricular ejection fraction, by estimation, is 60 to 65%. The left ventricle has normal function. The left ventricle has no regional wall motion abnormalities. Left ventricular diastolic parameters are consistent with Grade I diastolic dysfunction (impaired relaxation).  2. Right ventricular systolic function is normal. The right ventricular size is normal. Tricuspid regurgitation signal is inadequate for assessing PA pressure.  3. The mitral valve is normal in structure. Mild mitral valve regurgitation. FINDINGS  Left Ventricle: Left ventricular ejection fraction, by estimation, is 60 to 65%. The left ventricle has normal function. The left ventricle has no regional wall motion abnormalities. The left ventricular internal cavity size was normal in size. There is  no left ventricular hypertrophy. Left ventricular diastolic parameters are consistent with Grade I diastolic dysfunction (impaired relaxation). Right Ventricle: The right ventricular size is normal. No increase in right ventricular wall thickness. Right ventricular systolic function is normal. Tricuspid regurgitation signal is inadequate for assessing PA pressure. Left Atrium: Left atrial size was normal in size. Right Atrium: Right atrial size was normal in size. Pericardium: There is no evidence of pericardial effusion. Mitral Valve: The mitral valve is normal in structure. Mild mitral valve regurgitation. No evidence of mitral valve stenosis. Tricuspid Valve: The tricuspid valve is normal in structure. Tricuspid  valve regurgitation is not demonstrated. No evidence of tricuspid stenosis. Aortic Valve: The aortic valve is normal in structure. Aortic valve regurgitation is not visualized. No aortic stenosis is present. Aortic valve mean gradient measures 3.0 mmHg. Aortic valve peak gradient measures 5.4 mmHg. Aortic valve area, by VTI measures 2.68 cm. Pulmonic Valve: The pulmonic valve was normal in structure. Pulmonic valve regurgitation is not visualized. No evidence of pulmonic stenosis. Aorta: The aortic root is normal in size and structure. Venous: The inferior vena cava is normal in size with greater than 50% respiratory variability, suggesting right atrial pressure of 3  mmHg. IAS/Shunts: No atrial level shunt detected by color flow Doppler.  LEFT VENTRICLE PLAX 2D LVIDd:         3.88 cm  Diastology LVIDs:         2.31 cm  LV e' medial:    3.92 cm/s LV PW:         1.40 cm  LV E/e' medial:  18.8 LV IVS:        0.97 cm  LV e' lateral:   4.57 cm/s LVOT diam:     2.00 cm  LV E/e' lateral: 16.1 LV SV:         73 LV SV Index:   47 LVOT Area:     3.14 cm  RIGHT VENTRICLE RV Basal diam:  2.45 cm RV S prime:     12.70 cm/s TAPSE (M-mode): 3.2 cm LEFT ATRIUM             Index       RIGHT ATRIUM           Index LA diam:        3.30 cm 2.16 cm/m  RA Area:     10.10 cm LA Vol (A2C):   55.4 ml 36.19 ml/m RA Volume:   17.90 ml  11.69 ml/m LA Vol (A4C):   55.7 ml 36.38 ml/m LA Biplane Vol: 55.4 ml 36.19 ml/m  AORTIC VALVE                   PULMONIC VALVE AV Area (Vmax):    2.69 cm    PV Vmax:        0.74 m/s AV Area (Vmean):   2.38 cm    PV Peak grad:   2.2 mmHg AV Area (VTI):     2.68 cm    RVOT Peak grad: 3 mmHg AV Vmax:           116.50 cm/s AV Vmean:          85.750 cm/s AV VTI:            0.271 m AV Peak Grad:      5.4 mmHg AV Mean Grad:      3.0 mmHg LVOT Vmax:         99.80 cm/s LVOT Vmean:        65.000 cm/s LVOT VTI:          0.231 m LVOT/AV VTI ratio: 0.85  AORTA Ao Root diam: 2.80 cm MITRAL VALVE                 TRICUSPID VALVE MV Area (PHT): 3.00 cm     TR Peak grad:   4.0 mmHg MV Decel Time: 253 msec     TR Vmax:        100.00 cm/s MV E velocity: 73.70 cm/s MV A velocity: 104.00 cm/s  SHUNTS MV E/A ratio:  0.71         Systemic VTI:  0.23 m                             Systemic Diam: 2.00 cm Ida Rogue MD Electronically signed by Ida Rogue MD Signature Date/Time: 02/27/2020/12:38:47 PM    Final         Scheduled Meds: . amLODipine  2.5 mg Oral Daily  . enoxaparin (LOVENOX) injection  0.5 mg/kg Subcutaneous Q24H  . metoprolol tartrate  12.5 mg Oral BID  . sodium chloride flush  3 mL Intravenous Q12H   Continuous Infusions:  Assessment & Plan:   Principal Problem:   Dementia ([place]) Active Problems:   HTN (hypertension)   Paroxysmal atrial fibrillation with rapid ventricular response ([place])   Altered mental status   KATHE WIRICK is a 74 y.o. female with medical history significant for hypertension who is admitted with A. fib with RVR.  Paroxysmal atrial fibrillation with RVR: Initially on Cardizem drip  switched to beta-blockers  Remains sinus rhythm. Does require anticoagulation based on her CHADS2 score however with her incompetency/confusion unable to place her on anticoagulation at this time this may be addressed in the future. Echo with normal EF   Question of altered mental status versus cognitive impairment/dementia: Brought in under IVC by EMS per patient's PCP due to concern of underlying dementia/cognitive impairment and inability to care for self per ED documentation.  No family/close contact available on admission to provide further history.  Per chart review, she is scheduled for a palliative care visit on 03/10/2020.  CT head is negative for acute changes on admission.  No evidence of infection.  Possible she may have had a CVA given her A. Fib. Psychiatry was consulted to assess for competency-Per psych patient has signs of memory impairment confusion, paranoia  and some psychotic symptoms in her current condition patient is not capable of making rational decision about her own care.  Patient will be kept under IVC.  If she becomes agitated she may need medicine for that such as antipsychotics per psychiatry -10/30-Per psychiatry since patient remains confused will likely need social work intervention ultimately.  Psych does not  plan to change the commitment paper for now. Toxicology negative Covid negative In revealed no recent infarction, hemorrhage or mass.  Mild to moderate chronic microvascular ischemic changes.  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 Neurology was consulted for further assessment of her confusion  Continue with 1:1 sit   Hypertension: intially allowed permissive HTn incase pt had acute stroke. MRI as noted above Still has room for improvement.  We will continue on beta-blockers however unable to increase due to heart rate Will add amlodipine 2.5 mg qd, increase as tolerated and needed.       DVT prophylaxis: Lovenox Code Status: Full Family Communication: None at bedside  Status OF:[id number]  The patient will require care spanning > 2 midnights and requires inpatient level of care because: Inpatient level of care appropriate due to severity of illness  Dispo: The patient is from: Home              Anticipated d/c is to: TBD              Anticipated d/c date is: 3 days              Patient currently is not medically stable to d/c.  Still needs work-up for her mental status.  Also on IVC.            LOS: 2 days   Time spent:35 min with >50% on coc    Nolberto Hanlon, MD [facility] Pager [phone]  If 7PM-7AM, please contact night-coverage [email or web address] Password [email or web address] 02/29/2020, 8:04 AM

## 2020-02-29 NOTE — Plan of Care (Signed)
Continue with plan of care.  

## 2020-02-29 NOTE — Progress Notes (Signed)
Friend/Roomate, Crisofer Tiznado visited patient today. [name] while in the presence of the patient,  requested information on financial/medical POAs. Advised [name] the patient is not in a competent state of mind at this time to make such decision's. [name] kept insisting this needed to be done so he could pay the bills and make sure she is taken care of. Patient did not respond much, but seemed to become a bit agitated with [name] and asked him to stop talking about that subject at this time.   Fuller Mandril, RN
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
## 2020-03-01 DIAGNOSIS — I10 Essential (primary) hypertension: Secondary | ICD-10-CM

## 2020-03-01 DIAGNOSIS — I480 Paroxysmal atrial fibrillation: Secondary | ICD-10-CM | POA: Diagnosis not present

## 2020-03-01 LAB — RPR: RPR Ser Ql: NONREACTIVE

## 2020-03-01 MED ORDER — AMLODIPINE BESYLATE 5 MG PO TABS
5.0000 mg | ORAL_TABLET | Freq: Every day | ORAL | Status: DC
  Administered 2020-03-02 – 2020-03-16 (×15): 5 mg via ORAL
  Filled 2020-03-01 (×15): qty 1

## 2020-03-01 MED ORDER — VITAMIN B-12 1000 MCG PO TABS
1000.0000 ug | ORAL_TABLET | Freq: Every day | ORAL | Status: DC
  Administered 2020-03-01 – 2020-03-16 (×16): 1000 ug via ORAL
  Filled 2020-03-01 (×16): qty 1

## 2020-03-01 MED ORDER — CYANOCOBALAMIN 1000 MCG/ML IJ SOLN
1000.0000 ug | Freq: Once | INTRAMUSCULAR | Status: AC
  Administered 2020-03-01: 1000 ug via INTRAMUSCULAR
  Filled 2020-03-01: qty 1

## 2020-03-01 NOTE — Progress Notes (Signed)
[facility] [place] NAME: Lasonya Hubner    MR#:  [id number]  DATE OF BIRTH:  15-Mar-1946  SUBJECTIVE:  eating breakfast. Patient is confused at baseline. She knows she is in the hospital. Sitter in the room. REVIEW OF SYSTEMS:   Review of Systems  Unable to perform ROS: Mental status change   Tolerating Diet:yes Tolerating PT:   DRUG ALLERGIES:  No Known Allergies  VITALS:  Blood pressure 137/64, pulse 78, temperature 97.6 F (36.4 C), temperature source Oral, resp. rate 17, height 5' (1.524 m), weight 95.4 kg, SpO2 92 %.  PHYSICAL EXAMINATION:   Physical Examlimited due to MS  GENERAL:  74 y.o.-year-old patient lying in the bed with no acute distress.  HEENT: Head atraumatic, normocephalic. Oropharynx and nasopharynx clear.  NECK:  Supple, no jugular venous distention. No thyroid enlargement, no tenderness.  LUNGS: Normal breath sounds bilaterally, no wheezing, rales, rhonchi. No use of accessory muscles of respiration.  CARDIOVASCULAR: S1, S2 normal. No murmurs, rubs, or gallops.  ABDOMEN: Soft, nontender, nondistended. Bowel sounds present. No organomegaly or mass.  EXTREMITIES: No cyanosis, clubbing or edema b/l.    NEUROLOGIC: grossly nonfocal.  PSYCHIATRIC:  patient is alert and awake. Pleasant confusion at baseline. SKIN: No obvious rash, lesion, or ulcer.   LABORATORY PANEL:  CBC Recent Labs  Lab 02/27/20 1219  WBC 7.9  HGB 14.1  HCT 41.8  PLT 261    Chemistries  Recent Labs  Lab 02/26/20 2012 02/26/20 2012 02/27/20 1219  NA 141   < > 139  K 3.6   < > 3.8  CL 106   < > 105  CO2 27   < > 25  GLUCOSE 100*   < > 170*  BUN 16   < > 12  CREATININE 0.95   < > 0.74  CALCIUM 9.3   < > 9.4  MG 2.2  --   --   AST 17  --   --   ALT 12  --   --   ALKPHOS 90  --   --   BILITOT 0.9  --   --    < > = values in this interval not displayed.   Cardiac Enzymes No results for input(s): TROPONINI in the last 168  hours. RADIOLOGY:  No results found. ASSESSMENT AND PLAN:   Yamilka Lopiccolo Griffinis a 74 y.o.femalewith medical history significant forhypertension whois admitted with A. fib with RVR.  Paroxysmal atrial fibrillation with RVR: -Initially on Cardizem drip  switched to beta-blockers  -Remains sinus rhythm. -Does require anticoagulation based on her CHADS2 score however with her incompetency/confusion unable to place her on anticoagulation at this time this may be addressed in the future. -Echo with normal EF  Question of altered mental status versus cognitive impairment/dementia: -Brought in under IVC by EMS per patient's PCP due to concern of underlying dementia/cognitive impairment and inability to care for self per ED documentation. No family/close contact available on admission to provide further history.  --Per chart review, she is scheduled for apalliative care visit on 03/10/2020.  --CT head is negative for acute changes on admission. No evidence of infection.  --Psychiatry was consulted to assess for competency-Per psych patient has signs of memory impairment confusion, paranoia and some psychotic symptoms in her current condition patient is not capable of making rational decision about her own care.  Patient will be kept under IVC.   -10/30-Per psychiatry since patient remains confused will  likely need social work intervention ultimately.  Psych does not  plan to change the commitment paper for now. --Toxicology negative- --Covid negative --Neurology appreciate. F/u vit B1 , recommends outpatient neuropsych eval for dementia --RPR results non reactive --Continue with 1:1 sitter  Hypertension: -intially allowed permissive HTn incase pt had acute stroke. -MRI as noted above -on BB and amlodipine 2.5 mg qd, increase as tolerated and needed.   Vitamin B12 deficiency -cont PO replacement   DVT prophylaxis: Lovenox Code Status: Full Family Communication: None at  bedside  Status ZY:[id number]  The patient will require care spanning > 2 midnights and requires inpatient level of care because: Inpatient level of care appropriate due to severity of illness  Dispo: The patient is from: Home  Anticipated d/c is to: TBD  Anticipated d/c date is: 2-3 days  Patient currently is not medically stable to d/c.  Still needs work-up for her mental status.  Also on IVC. TOC for placement/d/c planning       TOTAL TIME TAKING CARE OF THIS PATIENT: *25* minutes.  >50% time spent on counselling and coordination of care  Note: This dictation was prepared with Dragon dictation along with smaller phrase technology. Any transcriptional errors that result from this process are unintentional.  Fritzi Mandes M.D    [facility]   CC: Primary care physician; [facility], IncPatient ID: NOLENE ROCKS, female   DOB: [birth year], 74 y.o.   MRN: [id number]

## 2020-03-02 DIAGNOSIS — I480 Paroxysmal atrial fibrillation: Secondary | ICD-10-CM | POA: Diagnosis not present

## 2020-03-02 DIAGNOSIS — I10 Essential (primary) hypertension: Secondary | ICD-10-CM | POA: Diagnosis not present

## 2020-03-02 DIAGNOSIS — E538 Deficiency of other specified B group vitamins: Secondary | ICD-10-CM

## 2020-03-02 MED ORDER — ZIPRASIDONE MESYLATE 20 MG IM SOLR
20.0000 mg | Freq: Once | INTRAMUSCULAR | Status: AC
  Administered 2020-03-02: 20 mg via INTRAMUSCULAR
  Filled 2020-03-02: qty 20

## 2020-03-02 MED ORDER — LORAZEPAM 0.5 MG PO TABS
0.5000 mg | ORAL_TABLET | Freq: Once | ORAL | Status: AC
  Administered 2020-03-02: 0.5 mg via ORAL
  Filled 2020-03-02: qty 1

## 2020-03-02 MED ORDER — RISPERIDONE 1 MG PO TBDP
1.0000 mg | ORAL_TABLET | Freq: Two times a day (BID) | ORAL | Status: DC
  Administered 2020-03-03 – 2020-03-16 (×26): 1 mg via ORAL
  Filled 2020-03-02 (×29): qty 1

## 2020-03-02 NOTE — Care Management Important Message (Signed)
Important Message  Patient Details  Name: Charlotte Lane MRN: [id number] Date of Birth: June 28, 1945   Medicare Important Message Given:  Yes     [name] 03/02/2020, 12:11 PM

## 2020-03-02 NOTE — Evaluation (Signed)
Physical Therapy Evaluation Patient Details Name: Charlotte Lane MRN: [id number] DOB: 1945-12-18 Today's Date: 03/02/2020   History of Present Illness  Charlotte Lane is a 73 y.o. female with PMH significant for HTN, new onset Afib with RVR who was brought in under Involuntary commitment. She had a wellness check, was found to be in Afib. She refused coming to the ED by a physician and was brought in under IVC.     Clinical Impression  Pt received in supine position with sitter present in room.  Pt agreeable to therapy and expressed interest in getting up to ambulate.  Pt was able to perform bed level exercises without much difficulty.  Pt did express decreased strength in BLE upon MMT of LEs.  Pt then proceeded to perform bed mobility with no assistance.  Pt reported mild dizziness when coming into standing and stood in place until sx's passed.  Pt then proceeded to ambulate around the nursing station twice with CGA.  Pt was able to perform with head turns but demonstrated mild instability with some of the head movements during ambulation.  Pt then transferred back to bed where sitter was present.  D/c options discussed with patient and due to decreased caregiver support after leaving hospital, current recommendation is for HHPT at d/c.  Pt will continue to benefit from skilled therapy during hospital stay to address balance deficits and strength deficits.      Follow Up Recommendations Home health PT    Equipment Recommendations  None recommended by PT    Recommendations for Other Services       Precautions / Restrictions Precautions Precautions: None Restrictions Weight Bearing Restrictions: No      Mobility  Bed Mobility Overal bed mobility: Independent                  Transfers Overall transfer level: Independent Equipment used: None Transfers: Sit to/from Stand Sit to Stand: Min guard         General transfer comment: Pt received and left standing up in  bathroom   Ambulation/Gait Ambulation/Gait assistance: Min guard Gait Distance (Feet): 320 Feet Assistive device: None Gait Pattern/deviations: Step-through pattern Gait velocity: Adequate   General Gait Details: Pt with good gait speed, however has some imbalance when performing higher level tasks such as head turns when walking.  Stairs            Wheelchair Mobility    Modified Rankin (Stroke Patients Only)       Balance Overall balance assessment: Mild deficits observed, not formally tested Sitting-balance support: Feet supported Sitting balance-Leahy Scale: Good     Standing balance support: No upper extremity supported;During functional activity Standing balance-Leahy Scale: Good               High level balance activites: Head turns High Level Balance Comments: Pt does have mild imbalance when performing head turns.             Pertinent Vitals/Pain Pain Assessment: No/denies pain    Home Living Family/patient expects to be discharged to:: Private residence Living Arrangements: Alone Available Help at Discharge: Family;Available PRN/intermittently Type of Home: House Home Access: Stairs to enter Entrance Stairs-Rails: Right Entrance Stairs-Number of Steps: 1-2 Home Layout: One level Home Equipment: Grab bars - tub/shower Additional Comments: Pt gives same setup as told earlier to PT. Then pt remarks "but I guess I can't go back there, maybe I can go live with my brother" - unsure d/c house setup  Prior Function Level of Independence: Independent         Comments: Pt reports driving and grocery shopping. Pt denies taking medication     Hand Dominance   Dominant Hand: Right    Extremity/Trunk Assessment   Upper Extremity Assessment Upper Extremity Assessment: Overall WFL for tasks assessed    Lower Extremity Assessment Lower Extremity Assessment: Overall WFL for tasks assessed    Cervical / Trunk Assessment Cervical / Trunk  Assessment: Normal  Communication   Communication: No difficulties  Cognition Arousal/Alertness: Awake/alert Behavior During Therapy: WFL for tasks assessed/performed Overall Cognitive Status: No family/caregiver present to determine baseline cognitive functioning                                       General Comments      Exercises Total Joint Exercises Ankle Circles/Pumps: AROM;Strengthening;Both;10 reps;Supine Quad Sets: AROM;Strengthening;Both;10 reps;Supine Gluteal Sets: AROM;Strengthening;Both;10 reps;Supine Heel Slides: AROM;Strengthening;Both;10 reps;Supine Straight Leg Raises: AROM;Strengthening;Both;10 reps;Supine Marching in Standing: AROM;Strengthening;Both;10 reps;Standing Other Exercises Other Exercises: Pt educated re: OT role, d/c recs, falls prevention, home/routines modifications, safety precautions Other Exercises: Hair brushing, UBD, sianding balance/tolerance   Assessment/Plan    PT Assessment Patient needs continued PT services  PT Problem List Decreased strength;Decreased activity tolerance;Decreased balance;Decreased cognition;Decreased safety awareness       PT Treatment Interventions Gait training;Stair training;Functional mobility training;Therapeutic activities;Therapeutic exercise;Balance training    PT Goals (Current goals can be found in the Care Plan section)  Acute Rehab PT Goals Patient Stated Goal: To return home    Frequency Min 2X/week   Barriers to discharge Decreased caregiver support Pt reports that she does not have anyone to assist her once d/c.    Co-evaluation               AM-PAC PT "6 Clicks" Mobility  Outcome Measure Help needed turning from your back to your side while in a flat bed without using bedrails?: None Help needed moving from lying on your back to sitting on the side of a flat bed without using bedrails?: None Help needed moving to and from a bed to a chair (including a wheelchair)?:  None Help needed standing up from a chair using your arms (e.g., wheelchair or bedside chair)?: None Help needed to walk in hospital room?: A Little Help needed climbing 3-5 steps with a railing? : A Little 6 Click Score: 22    End of Session Equipment Utilized During Treatment: Gait belt Activity Tolerance: Patient tolerated treatment well Patient left: in bed;with nursing/sitter in room;with call bell/phone within reach Nurse Communication: Mobility status PT Visit Diagnosis: Unsteadiness on feet (R26.81);Other abnormalities of gait and mobility (R26.89);Muscle weakness (generalized) (M62.81);Difficulty in walking, not elsewhere classified (R26.2)    Time: 0814-4818 PT Time Calculation (min) (ACUTE ONLY): 23 min   Charges:   PT Evaluation $PT Eval Low Complexity: 1 Low PT Treatments $Gait Training: 8-22 mins $Therapeutic Exercise: 8-22 mins        Gwenlyn Saran, PT, DPT 03/02/20, 3:47 PM

## 2020-03-02 NOTE — Plan of Care (Signed)
  Problem: Safety: Goal: Ability to remain free from injury will improve Outcome: Progressing   

## 2020-03-02 NOTE — Progress Notes (Signed)
Patient became suddenly agitated. Insisted to nursing staff she was leaving and going home. NT and RN tried to redirect patient verbally. Patient became more agitated. Dr. Weber Cooks contacted. New medication orders added.   Fuller Mandril, RN

## 2020-03-02 NOTE — Evaluation (Signed)
Occupational Therapy Evaluation Patient Details Name: Charlotte Lane MRN: [id number] DOB: [birth year] Today's Date: 03/02/2020    History of Present Illness TIYE HUWE is a 74 y.o. female with PMH significant for HTN, new onset Afib with RVR who was brought in under Involuntary commitment. She had a wellness check, was found to be in Afib. She refused coming to the ED by a physician and was brought in under IVC.   Clinical Impression   Ms Henne was seen for OT evaluation this date. Prior to hospital admission, pt was Independent for mobility and ADLs including driving. Pt reports living alone in house c 1 STE then reports she may not be able to return there. Pt presents to acute OT demonstrating impaired ADL performance and functional cognition 2/2 decreased safety awareness, poor insight into deficits, and decreased activity tolerance. Upon arrival pt standing in bathroom combing hair c sitter at bed side. Pt is Independent c mobility and standing grooming, anticipate MOD I for bathing seated. Anticipate MIN A for IADLs including medication mgmt.   Pt is pleasantly confused. Pt completed the O-Log this date scoring 14/30. Pt was disoriented to place and situation, required cues to orient to time (see cognition section below for further details). Pt would benefit from skilled OT to address noted impairments and functional limitations (see below for any additional details) in order to maximize safety and independence while minimizing falls risk and caregiver burden. Upon hospital discharge, recommend [place] c SUPERVISION to maximize pt safety and return to functional independence during meaningful occupations of daily life.     Follow Up Recommendations  Home health OT;Supervision - Intermittent    Equipment Recommendations  None recommended by OT    Recommendations for Other Services       Precautions / Restrictions Precautions Precautions: None Restrictions Weight Bearing  Restrictions: No      Mobility Bed Mobility Overal bed mobility: Independent     Transfers Overall transfer level: Independent Equipment used: None Transfers: Sit to/from Stand Sit to Stand: Min guard         General transfer comment: Pt received and left standing up in bathroom     Balance Overall balance assessment: Mild deficits observed, not formally tested     Standing balance support: No upper extremity supported;During functional activity Standing balance-Leahy Scale: Good      ADL either performed or assessed with clinical judgement   ADL Overall ADL's : Modified independent      General ADL Comments: Independent hairbrushing standing sink side. Anticipate MOD I for bathing in sitting. Independent for ADL t/f                   Pertinent Vitals/Pain Pain Assessment: No/denies pain     Hand Dominance Right   Extremity/Trunk Assessment Upper Extremity Assessment Upper Extremity Assessment: Overall WFL for tasks assessed   Lower Extremity Assessment Lower Extremity Assessment: Overall WFL for tasks assessed   Cervical / Trunk Assessment Cervical / Trunk Assessment: Normal   Communication Communication Communication: No difficulties   Cognition Arousal/Alertness: Awake/alert Behavior During Therapy: WFL for tasks assessed/performed Overall Cognitive Status: No family/caregiver present to determine baseline cognitive functioning          General Comments: Pt is pleasantly confused. Pt completed the O-Log this date scoring 14/30. Pt disoriented to place and situation, required cues to orient to time. The Orientation Log (O-Log) is designed to be a quick quantitative measure of orientational status for use at bedside with  rehabilitation inpatients. Place, time, and situational (Etiology/Event + Pathology/Deficits) domains are assessed and can be tracked over time. Patient responses are scored based on spontaneous recall, logical cueing, multiple  choice, and incorrect responses.      General Comments       Exercises Exercises: Other exercises Other Exercises Other Exercises: Pt educated re: OT role, d/c recs, falls prevention, home/routines modifications, safety precautions Other Exercises: Hair brushing, UBD, sianding balance/tolerance   Shoulder Instructions      Home Living Family/patient expects to be discharged to:: Private residence Living Arrangements: Alone Available Help at Discharge: Family;Available PRN/intermittently Type of Home: House Home Access: Stairs to enter [employer] of Steps: 1-2 Entrance Stairs-Rails: Right Home Layout: One level     Bathroom Shower/Tub: Occupational psychologist: Standard Bathroom Accessibility: Yes   Home Equipment: Grab bars - tub/shower   Additional Comments: Pt gives same setup as told earlier to PT. Then pt remarks "but I guess I can't go back there, maybe I can go live with my brother" - unsure d/c house setup      Prior Functioning/Environment Level of Independence: Independent        Comments: Pt reports driving and grocery shopping. Pt denies taking medication        OT Problem List: Decreased cognition;Decreased knowledge of use of DME or AE;Decreased safety awareness      OT Treatment/Interventions: Self-care/ADL training;Therapeutic exercise;Energy conservation;DME and/or AE instruction;Therapeutic activities;Balance training;Patient/family education    OT Goals(Current goals can be found in the care plan section) Acute Rehab OT Goals Patient Stated Goal: To return home OT Goal Formulation: With patient Time For Goal Achievement: 03/16/20 Potential to Achieve Goals: Good ADL Goals Pt Will Perform Tub/Shower Transfer: Shower transfer;Independently;ambulating Additional ADL Goal #1: Pt will verbalize plan to implement x3 fall prevention strategies Additional ADL Goal #2: Pt will complete medication mgmt tasks c SUPERVISION  OT  Frequency: Min 1X/week   Barriers to D/C: Decreased caregiver support             AM-PAC OT "6 Clicks" Daily Activity     Outcome Measure Help from another person eating meals?: None Help from another person taking care of personal grooming?: None Help from another person toileting, which includes using toliet, bedpan, or urinal?: None Help from another person bathing (including washing, rinsing, drying)?: A Little Help from another person to put on and taking off regular upper body clothing?: None Help from another person to put on and taking off regular lower body clothing?: None 6 Click Score: 23   End of Session    Activity Tolerance: Patient tolerated treatment well Patient left: with nursing/sitter in room (standing in room c sitter at bed side )  OT Visit Diagnosis: Other abnormalities of gait and mobility (R26.89)                Time: 2481-8590 OT Time Calculation (min): 12 min Charges:  OT General Charges $OT Visit: 1 Visit OT Evaluation $OT Eval Low Complexity: 1 Low  Dessie Coma, M.S. OTR/L  03/02/20, 3:03 PM  ascom [phone]

## 2020-03-02 NOTE — Progress Notes (Addendum)
PROGRESS NOTE    Charlotte Lane  LFY:[id number] DOB: March 23, 1946 DOA: 02/26/2020 PCP: [facility]    Brief Narrative:  presents to the ED under IVC via EMS for evaluation of A. fib with RVR.   10/28- psych consulted for competency 10/29-calmer today.MRI was not completed yesterday due to pts behavior/refusal/ 10/30-still remains confused, even after repeating to her multiple times where she is currently she is unable to tell me she is at hospital. Per psychiatry will not change commitment paperwork for now  11/1-confused, still doesn't know where . No overnight issue. Sitter at bedside.   Consultants:   Psychiatry, neurology  Procedures: MRI  Antimicrobials:       Subjective: Remains confused.  Denies any shortness of breath, chest pain or any other complaints  Objective: Vitals:   03/01/20 1939 03/01/20 2123 03/01/20 2329 03/02/20 0410  BP: (!) 156/65  (!) 124/59 (!) 123/59  Pulse: 73 70 63 (!) 57  Resp: 18  20 20   Temp: (!) 97.5 F (36.4 C)  97.7 F (36.5 C) (!) 97.4 F (36.3 C)  TempSrc: Oral   Oral  SpO2: 97%  99% 98%  Weight:      Height:        Intake/Output Summary (Last 24 hours) at 03/02/2020 0747 Last data filed at 03/01/2020 2128 Gross per 24 hour  Intake 723 ml  Output --  Net 723 ml   Filed Weights   02/27/20 0400 02/27/20 0617 02/27/20 2300  Weight: 56.9 kg 56.9 kg 95.4 kg    Examination:  General exam: Appears calm and comfortable, cooperative  respiratory system: Clear to auscultation. Respiratory effort normal. Cardiovascular system: S1 & S2 heard, RRR. No JVD, murmurs, rubs, gallops or clicks. Gastrointestinal system: Abdomen is nondistended, soft and nontender.  Normal bowel sounds heard. Central nervous system: Awake, alert to date, person but not her whereabouts.  Grossly intact Extremities: No edema Skin: Warm dry Psychiatry: Calm, mood & affect appropriate in her current situation.     Data  Reviewed: I have personally reviewed following labs and imaging studies  CBC: Recent Labs  Lab 02/26/20 2012 02/27/20 1219  WBC 12.2* 7.9  NEUTROABS 6.8  --   HGB 15.4* 14.1  HCT 46.9* 41.8  MCV 89.3 87.6  PLT 296 852   Basic Metabolic Panel: Recent Labs  Lab 02/26/20 2012 02/27/20 1219  NA 141 139  K 3.6 3.8  CL 106 105  CO2 27 25  GLUCOSE 100* 170*  BUN 16 12  CREATININE 0.95 0.74  CALCIUM 9.3 9.4  MG 2.2  --    GFR: Estimated Creatinine Clearance: 64.8 mL/min (by C-G formula based on SCr of 0.74 mg/dL). Liver Function Tests: Recent Labs  Lab 02/26/20 2012  AST 17  ALT 12  ALKPHOS 90  BILITOT 0.9  PROT 7.3  ALBUMIN 4.0   No results for input(s): LIPASE, AMYLASE in the last 168 hours. Recent Labs  Lab 02/29/20 1404  AMMONIA 26   Coagulation Profile: No results for input(s): INR, PROTIME in the last 168 hours. Cardiac Enzymes: No results for input(s): CKTOTAL, CKMB, CKMBINDEX, TROPONINI in the last 168 hours. BNP (last 3 results) No results for input(s): PROBNP in the last 8760 hours. HbA1C: No results for input(s): HGBA1C in the last 72 hours. CBG: No results for input(s): GLUCAP in the last 168 hours. Lipid Profile: No results for input(s): CHOL, HDL, LDLCALC, TRIG, CHOLHDL, LDLDIRECT in the last 72 hours. Thyroid Function Tests:  Recent Labs    02/29/20 1404  TSH 0.938   Anemia Panel: Recent Labs    02/29/20 1404  VITAMINB12 136*  FOLATE 9.9   Sepsis Labs: Recent Labs  Lab 02/26/20 2012  LATICACIDVEN 1.4    Recent Results (from the past 240 hour(s))  Respiratory Panel by RT PCR (Flu A&B, Covid) - Nasopharyngeal Swab     Status: None   Collection Time: 02/26/20  8:12 PM   Specimen: Nasopharyngeal Swab  Result Value Ref Range Status   SARS Coronavirus 2 by RT PCR NEGATIVE NEGATIVE Final    Comment: (NOTE) SARS-CoV-2 target nucleic acids are NOT DETECTED.  The SARS-CoV-2 RNA is generally detectable in upper respiratoy specimens  during the acute phase of infection. The lowest concentration of SARS-CoV-2 viral copies this assay can detect is 131 copies/mL. A negative result does not preclude SARS-Cov-2 infection and should not be used as the sole basis for treatment or other patient management decisions. A negative result may occur with  improper specimen collection/handling, submission of specimen other than nasopharyngeal swab, presence of viral mutation(s) within the areas targeted by this assay, and inadequate number of viral copies (<131 copies/mL). A negative result must be combined with clinical observations, patient history, and epidemiological information. The expected result is Negative.  Fact Sheet for Patients:  PinkCheek.be  Fact Sheet for Healthcare Providers:  GravelBags.it  This test is no t yet approved or cleared by the [place] FDA and  has been authorized for detection and/or diagnosis of SARS-CoV-2 by FDA under an Emergency Use Authorization (EUA). This EUA will remain  in effect (meaning this test can be used) for the duration of the COVID-19 declaration under Section 564(b)(1) of the Act, 21 U.S.C. section 360bbb-3(b)(1), unless the authorization is terminated or revoked sooner.     Influenza A by PCR NEGATIVE NEGATIVE Final   Influenza B by PCR NEGATIVE NEGATIVE Final    Comment: (NOTE) The Xpert Xpress SARS-CoV-2/FLU/RSV assay is intended as an aid in  the diagnosis of influenza from Nasopharyngeal swab specimens and  should not be used as a sole basis for treatment. Nasal washings and  aspirates are unacceptable for Xpert Xpress SARS-CoV-2/FLU/RSV  testing.  Fact Sheet for Patients: PinkCheek.be  Fact Sheet for Healthcare Providers: GravelBags.it  This test is not yet approved or cleared by the [place] FDA and  has been authorized for detection and/or  diagnosis of SARS-CoV-2 by  FDA under an Emergency Use Authorization (EUA). This EUA will remain  in effect (meaning this test can be used) for the duration of the  Covid-19 declaration under Section 564(b)(1) of the Act, 21  U.S.C. section 360bbb-3(b)(1), unless the authorization is  terminated or revoked. Performed at [facility], [address] [place], [place] [address]          Radiology Studies: No results found.      Scheduled Meds: . amLODipine  5 mg Oral Daily  . enoxaparin (LOVENOX) injection  0.5 mg/kg Subcutaneous Q24H  . metoprolol tartrate  12.5 mg Oral BID  . sodium chloride flush  3 mL Intravenous Q12H  . thiamine injection  100 mg Intravenous Daily   Or  . thiamine  100 mg Oral Daily  . vitamin B-12  1,000 mcg Oral Daily   Continuous Infusions:  Assessment & Plan:   Principal Problem:   Dementia ([place]) Active Problems:   HTN (hypertension)   Paroxysmal atrial fibrillation with rapid ventricular response (HCC)   Altered mental  status  Charlotte Lane a 74 y.o.femalewith medical history significant forhypertension whois admitted with A. fib with RVR.  Paroxysmal atrial fibrillation with RVR: Remains in SR Continue with the blockers  Does require anticoagulation however due to her incompetency/confusion unable to place patient on anticoagulation at this time.  May need to readdress this as outpatient in the near future when medically she is more stable.   Echo with normal EF     Question of altered mental status versus cognitive impairment/dementia: Brought in under IVC by EMS per patient's PCP due to concern of underlying dementia/cognitive impairment and inability to care for self per ED documentation. No family/close contact available on admission to provide further history. Per chart review, she is scheduled for apalliative care visit on 03/10/2020. CT head is negative for acute changes on admission. No evidence of  infection. Possible she may have had a CVA given her A. Fib. Psychiatry was consulted to assess for competency-Per psych patient has signs of memory impairment confusion, paranoia and some psychotic symptoms in her current condition patient is not capable of making rational decision about her own care.  Patient will be kept under IVC.  If she becomes agitated she may need medicine for that such as antipsychotics per psychiatry -10/30-Per psychiatry since patient remains confused will likely need social work intervention ultimately.  Psych does not  plan to change the commitment paper for now. Toxicology negative Covid negative MRI- no recent infarction, hemorrhage or mass.  Mild to moderate chronic microvascular ischemic changes.  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 11/1- Neurology following-need to r/o reversible causes of dementia . HIV neg. Low B12 ..>started on replacement nml folate, TSH. RPR pending Ammonia level nml Per neurology recommend outpatient detailed neuropsych testing and follow-up with dementia specialist.  Neurology has signed off now. Continue thiamine supplementation 2.   Hypertension: Overall stable Continue with beta-blockers and amlodipine  Vitamine B12 deficiency- On po supplement  Consult PT/OT- for evaluation of placment  DVT prophylaxis: Lovenox Code Status: Full Family Communication: None at bedside  Status is: Inpatient  Remains inpatient appropriate because:Unsafe d/c plan   Dispo: The patient is from: Home              Anticipated d/c is to: TBD              Anticipated d/c date is: 2 days              Patient currently is medically stable to d/c.PT/OT consulted, needs placement as pt is IVC            LOS: 4 days   Time spent: 30 minutes with more than 50% on [place], MD [facility] Pager [phone]  If 7PM-7AM, please contact night-coverage [email or web address] Password  [facility] 03/02/2020, 7:47 AM

## 2020-03-03 DIAGNOSIS — I480 Paroxysmal atrial fibrillation: Secondary | ICD-10-CM | POA: Diagnosis not present

## 2020-03-03 DIAGNOSIS — I10 Essential (primary) hypertension: Secondary | ICD-10-CM | POA: Diagnosis not present

## 2020-03-03 LAB — CBC
HCT: 40.7 % (ref 36.0–46.0)
Hemoglobin: 13.6 g/dL (ref 12.0–15.0)
MCH: 29.9 pg (ref 26.0–34.0)
MCHC: 33.4 g/dL (ref 30.0–36.0)
MCV: 89.5 fL (ref 80.0–100.0)
Platelets: 235 10*3/uL (ref 150–400)
RBC: 4.55 MIL/uL (ref 3.87–5.11)
RDW: 13.7 % (ref 11.5–15.5)
WBC: 9.7 10*3/uL (ref 4.0–10.5)
nRBC: 0 % (ref 0.0–0.2)

## 2020-03-03 LAB — METHYLMALONIC ACID, SERUM: Methylmalonic Acid, Quantitative: 592 nmol/L — ABNORMAL HIGH (ref 0–378)

## 2020-03-03 NOTE — TOC Progression Note (Signed)
Transition of Care [facility]) - Progression Note    Patient Details  Name: Charlotte Lane MRN: [id number] Date of Birth: [birth year]  [facility] CM/SW [place], LCSW Phone Number: 03/03/2020, 10:48 AM  Clinical Narrative:  CSW spoke to patient's DSS worker, Charlotte Lane. She is going to call patient's friend today to try and get more information. Patient told her that she has two brothers and a son but her son and one brother is deceased. She does not know the phone number of her Lane brother Charlotte Lane?). Patient told her she had a cell phone but lost it. Ms. Redmond Pulling talked to her about potential ALF placement at [facility]. Patient told her she would think about it at the time.  Expected Discharge Plan: [place] Barriers to Discharge: Continued Medical Work up, Other (comment) (Patient does not have capacity)  Expected Discharge Plan and Services Expected Discharge Plan: [place] In-house Referral: Clinical Social Work     Lane arrangements for the past 2 months: Single Family Home                                       Social Determinants of Health (SDOH) Interventions    Readmission Risk Interventions No flowsheet data found.

## 2020-03-03 NOTE — Consult Note (Signed)
[facility] Consult   Reason for Consult: Consult follow-up for 74 year old woman with dementia Referring Physician:  Kurtis Bushman Patient Identification: Charlotte Lane MRN:  [id number] Principal Diagnosis: Dementia ([place]) Diagnosis:  Principal Problem:   Dementia ([place]) Active Problems:   HTN (hypertension)   Paroxysmal atrial fibrillation with rapid ventricular response ([place])   Altered mental status   Total Time spent with patient: 30 minutes  Subjective:   Charlotte Lane is a 74 y.o. female patient admitted with "I do not rightly know".  HPI: Follow-up for this 74 year old woman whom I saw in the emergency room a few days ago.  Patient seen in her hospital room.  She was awake and alert and eating some food.  Patient had no memory of coming to the hospital.  Could not describe to me where she had been staying or the circumstances of her life before coming to the hospital.  Claimed to have no idea whatsoever what kind of medical problems she had and that no one had spoken with her about them.  Could not articulate for me a meaningful plan for what she would do after she left the hospital.  Patient denies feeling depressed.  Affect is euthymic and reactive.  Thoughts are simplistic not bizarre just seem to be based on little information.  Not hostile or threatening.  Past Psychiatric History: Unclear past psychiatric history  Risk to Self:   Risk to Others:   Prior Inpatient Therapy:   Prior Outpatient Therapy:    Past Medical History:  Past Medical History:  Diagnosis Date  . Hypertension     Past Surgical History:  Procedure Laterality Date  . CYST EXCISION Right 03/06/2019   Procedure: EXCISION SEBACEOUS CYST SCALP;  Surgeon: Aviva Signs, MD;  Location: [facility];  Service: General;  Laterality: Right;  . NO PAST SURGERIES     Family History:  Family History  Problem Relation Age of Onset  . Heart attack Father    Family Psychiatric  History: See  previous Social History:  Social History   Substance and Sexual Activity  Alcohol Use No     Social History   Substance and Sexual Activity  Drug Use No    Social History   Socioeconomic History  . Marital status: Single    Spouse name: Not on file  . Number of children: Not on file  . Years of education: Not on file  . Highest education level: Not on file  Occupational History  . Not on file  Tobacco Use  . Smoking status: Never Smoker  . Smokeless tobacco: Never Used  Vaping Use  . Vaping Use: Never used  Substance and Sexual Activity  . Alcohol use: No  . Drug use: No  . Sexual activity: Not on file  Other Topics Concern  . Not on file  Social History Narrative  . Not on file   Social Determinants of Health   Financial Resource Strain:   . Difficulty of Paying Living Expenses: Not on file  Food Insecurity:   . Worried About Charity fundraiser in the Last Year: Not on file  . Ran Out of Food in the Last Year: Not on file  Transportation Needs:   . Lack of Transportation (Medical): Not on file  . Lack of Transportation (Non-Medical): Not on file  Physical Activity:   . Days of Exercise per Week: Not on file  . Minutes of Exercise per Session: Not on file  Stress:   .  Feeling of Stress : Not on file  Social Connections:   . Frequency of Communication with Friends and Family: Not on file  . Frequency of Social Gatherings with Friends and Family: Not on file  . Attends Religious Services: Not on file  . Active Member of Clubs or Organizations: Not on file  . Attends Archivist Meetings: Not on file  . Marital Status: Not on file   Additional Social History:    Allergies:  No Known Allergies  Labs:  Results for orders placed or performed during the hospital encounter of 02/26/20 (from the past 48 hour(s))  CBC     Status: None   Collection Time: 03/03/20  4:56 AM  Result Value Ref Range   WBC 9.7 4.0 - 10.5 K/uL   RBC 4.55 3.87 - 5.11  MIL/uL   Hemoglobin 13.6 12.0 - 15.0 g/dL   HCT 40.7 36 - 46 %   MCV 89.5 80.0 - 100.0 fL   MCH 29.9 26.0 - 34.0 pg   MCHC 33.4 30.0 - 36.0 g/dL   RDW 13.7 11.5 - 15.5 %   Platelets 235 150 - 400 K/uL   nRBC 0.0 0.0 - 0.2 %    Comment: Performed at [facility], [address], [place], [place] [address]    Current Facility-Administered Medications  Medication Dose Route Frequency Provider Last Rate Last Admin  . acetaminophen (TYLENOL) tablet 650 mg  650 mg Oral Q6H PRN Lenore Cordia, MD       Or  . acetaminophen (TYLENOL) suppository 650 mg  650 mg Rectal Q6H PRN Zada Finders R, MD      . amLODipine (NORVASC) tablet 5 mg  5 mg Oral Daily Fritzi Mandes, MD   5 mg at 03/03/20 1039  . enoxaparin (LOVENOX) injection 47.5 mg  0.5 mg/kg Subcutaneous Q24H Rocky Morel, RPH   47.5 mg at 03/02/20 2039  . metoprolol tartrate (LOPRESSOR) tablet 12.5 mg  12.5 mg Oral BID Zada Finders R, MD   12.5 mg at 03/03/20 1039  . risperiDONE (RISPERDAL M-TABS) disintegrating tablet 1 mg  1 mg Oral BID Kahlia Lagunes, Madie Reno, MD   1 mg at 03/03/20 1039  . sodium chloride flush (NS) 0.9 % injection 3 mL  3 mL Intravenous Q12H Zada Finders R, MD   3 mL at 03/03/20 1039  . thiamine (B-1) injection 100 mg  100 mg Intravenous Daily Donnetta Simpers, MD       Or  . thiamine tablet 100 mg  100 mg Oral Daily Donnetta Simpers, MD   100 mg at 03/03/20 1039  . vitamin B-12 (CYANOCOBALAMIN) tablet 1,000 mcg  1,000 mcg Oral Daily Donnetta Simpers, MD   1,000 mcg at 03/03/20 1039    Musculoskeletal: Strength & Muscle Tone: decreased Gait & Station: normal Patient leans: N/A  Psychiatric Specialty Exam: Physical Exam Vitals and nursing note reviewed.  Constitutional:      Appearance: She is well-developed.  HENT:     Head: Normocephalic and atraumatic.  Eyes:     Conjunctiva/sclera: Conjunctivae normal.     Pupils: Pupils are equal, round, and reactive to light.  Cardiovascular:     Heart  sounds: Normal heart sounds.  Pulmonary:     Effort: Pulmonary effort is normal.  Abdominal:     Palpations: Abdomen is soft.  Musculoskeletal:        General: Normal range of motion.     Cervical back: Normal range of motion.  Skin:  General: Skin is warm and dry.  Neurological:     General: No focal deficit present.     Mental Status: She is alert.  Psychiatric:        Attention and Perception: She is inattentive.        Mood and Affect: Mood normal.        Speech: Speech is delayed.        Behavior: Behavior is slowed.        Thought Content: Thought content does not include homicidal or suicidal ideation.        Cognition and Memory: Cognition is impaired. Memory is impaired.        Judgment: Judgment is inappropriate.     Review of Systems  Constitutional: Negative.   HENT: Negative.   Eyes: Negative.   Respiratory: Negative.   Cardiovascular: Negative.   Gastrointestinal: Negative.   Musculoskeletal: Negative.   Skin: Negative.   Neurological: Negative.   Psychiatric/Behavioral: Negative.     Blood pressure (!) 126/102, pulse 64, temperature (!) 97.3 F (36.3 C), temperature source Axillary, resp. rate 16, height 5' (1.524 m), weight 95.4 kg, SpO2 99 %.Body mass index is 41.08 kg/m.  General Appearance: Casual  Eye Contact:  Minimal  Speech:  Slow  Volume:  Decreased  Mood:  Euthymic  Affect:  Constricted  Thought Process:  Disorganized  Orientation:  Negative  Thought Content:  Illogical and Tangential  Suicidal Thoughts:  No  Homicidal Thoughts:  No  Memory:  Immediate;   Fair Recent;   Poor Remote;   Poor  Judgement:  Poor  Insight:  Lacking  Psychomotor Activity:  Decreased  Concentration:  Concentration: Poor  Recall:  Poor  Fund of Knowledge:  Poor  Language:  Poor  Akathisia:  No  Handed:  Right  AIMS (if indicated):     Assets:  Desire for Improvement Financial Resources/Insurance  ADL's:  Impaired  Cognition:  Impaired,  Moderate   Sleep:        Treatment Plan Summary: Plan Patient with dementia.  On reexamination today while she is calm she is still having serious problems with her memory.  Seems at times to confabulate a little bit.  Patient does not have capacity right now to make reasonable judgments about her own care.  She is under IVC which was filed initially in order to prevent the patient from leaving the hospital and allow for her to be kept in the hospital by force if needed.  Spoke with hospitalist today.  I am glad to continue the IVC for now if desired.  It allows for the patient to be kept in the hospital if she were to try to get up and walk out.  It can be discontinued at any time.  On the other hand if it is getting in the way of disposition plans we can cancel it although then the team should be aware there will be a risk of the patient getting up and trying to elope.  No change currently to medication low-dose antipsychotics helpful for some of the confusion and agitation.  Follow-up as needed.  Disposition: Supportive therapy provided about ongoing stressors.  Alethia Berthold, MD 03/03/2020 5:07 PM

## 2020-03-03 NOTE — Progress Notes (Signed)
Patient hadn't voided since yesterday evening. Bladder scan showed 377 mL. Patient voided after bladder scan. Refilled patient's water pitcher. Encouraged patient to drink more oral fluids.

## 2020-03-03 NOTE — Progress Notes (Addendum)
[facility]) Hospital Liaison RN note:  This patient is currently followed by out patient based palliative care with [facility]. Patient has an appointment with the palliative NP at home scheduled for 11.09 at 8:45am. We will follow for disposition. Dayton Scrape, TOC is aware.  Thank you.  Zandra Abts, RN [facility] Liaison  [phone]

## 2020-03-03 NOTE — Progress Notes (Signed)
PROGRESS NOTE    Charlotte Lane  KYH:[id number] DOB: 05/26/1945 DOA: 02/26/2020 PCP: [facility]    Brief Narrative:  presents to the ED under IVC via EMS for evaluation of A. fib with RVR.   10/28- psych consulted for competency 10/29-calmer today.MRI was not completed yesterday due to pts behavior/refusal/ 10/30-still remains confused, even after repeating to her multiple times where she is currently she is unable to tell me she is at hospital. Per psychiatry will not change commitment paperwork for now  11/1-confused, still doesn't know where . No overnight issue. Sitter at bedside. 11/2- last night patient suddenly became agitated.  Insisted to nursing staff that she wanted to leave and go home.  Psychiatry Dr. Weber Cooks was called.  Was given Geodon IV with also Ativan.  She finally calmed down.  This a.m. she is calm.   Consultants:   Psychiatry, neurology  Procedures: MRI  Antimicrobials:       Subjective: Patient has no complaints this AM.  Sitter at bedside.  Objective: Vitals:   03/02/20 2011 03/03/20 0100 03/03/20 0513 03/03/20 0706  BP: (!) 128/48 (!) 136/59 126/66 (!) 124/59  Pulse: 71 60 (!) 57 62  Resp: 16 16 18 16   Temp: 98.6 F (37 C) 98.6 F (37 C) 98.4 F (36.9 C) 97.6 F (36.4 C)  TempSrc:  Oral  Oral  SpO2: 98% 98% 99% 99%  Weight:      Height:       No intake or output data in the 24 hours ending 03/03/20 0829 Filed Weights   02/27/20 0400 02/27/20 0617 02/27/20 2300  Weight: 56.9 kg 56.9 kg 95.4 kg    Examination:  Calm, comfortable, NAD CTA, no wheeze rales regular S1-S2 no murmur Soft benign positive bowel sounds No edema Alert awake and oriented x3 today, grossly intact Mood she is calm and cooperative this a.m.   Data Reviewed: I have personally reviewed following labs and imaging studies  CBC: Recent Labs  Lab 02/26/20 2012 02/27/20 1219 03/03/20 0456  WBC 12.2* 7.9 9.7  NEUTROABS 6.8   --   --   HGB 15.4* 14.1 13.6  HCT 46.9* 41.8 40.7  MCV 89.3 87.6 89.5  PLT 296 261 151   Basic Metabolic Panel: Recent Labs  Lab 02/26/20 2012 02/27/20 1219  NA 141 139  K 3.6 3.8  CL 106 105  CO2 27 25  GLUCOSE 100* 170*  BUN 16 12  CREATININE 0.95 0.74  CALCIUM 9.3 9.4  MG 2.2  --    GFR: Estimated Creatinine Clearance: 64.8 mL/min (by C-G formula based on SCr of 0.74 mg/dL). Liver Function Tests: Recent Labs  Lab 02/26/20 2012  AST 17  ALT 12  ALKPHOS 90  BILITOT 0.9  PROT 7.3  ALBUMIN 4.0   No results for input(s): LIPASE, AMYLASE in the last 168 hours. Recent Labs  Lab 02/29/20 1404  AMMONIA 26   Coagulation Profile: No results for input(s): INR, PROTIME in the last 168 hours. Cardiac Enzymes: No results for input(s): CKTOTAL, CKMB, CKMBINDEX, TROPONINI in the last 168 hours. BNP (last 3 results) No results for input(s): PROBNP in the last 8760 hours. HbA1C: No results for input(s): HGBA1C in the last 72 hours. CBG: No results for input(s): GLUCAP in the last 168 hours. Lipid Profile: No results for input(s): CHOL, HDL, LDLCALC, TRIG, CHOLHDL, LDLDIRECT in the last 72 hours. Thyroid Function Tests: Recent Labs    02/29/20 1404  TSH 0.938  Anemia Panel: Recent Labs    02/29/20 1404  VITAMINB12 136*  FOLATE 9.9   Sepsis Labs: Recent Labs  Lab 02/26/20 2012  LATICACIDVEN 1.4    Recent Results (from the past 240 hour(s))  Respiratory Panel by RT PCR (Flu A&B, Covid) - Nasopharyngeal Swab     Status: None   Collection Time: 02/26/20  8:12 PM   Specimen: Nasopharyngeal Swab  Result Value Ref Range Status   SARS Coronavirus 2 by RT PCR NEGATIVE NEGATIVE Final    Comment: (NOTE) SARS-CoV-2 target nucleic acids are NOT DETECTED.  The SARS-CoV-2 RNA is generally detectable in upper respiratoy specimens during the acute phase of infection. The lowest concentration of SARS-CoV-2 viral copies this assay can detect is 131 copies/mL. A  negative result does not preclude SARS-Cov-2 infection and should not be used as the sole basis for treatment or other patient management decisions. A negative result may occur with  improper specimen collection/handling, submission of specimen other than nasopharyngeal swab, presence of viral mutation(s) within the areas targeted by this assay, and inadequate number of viral copies (<131 copies/mL). A negative result must be combined with clinical observations, patient history, and epidemiological information. The expected result is Negative.  Fact Sheet for Patients:  PinkCheek.be  Fact Sheet for Healthcare Providers:  GravelBags.it  This test is no t yet approved or cleared by the [place] FDA and  has been authorized for detection and/or diagnosis of SARS-CoV-2 by FDA under an Emergency Use Authorization (EUA). This EUA will remain  in effect (meaning this test can be used) for the duration of the COVID-19 declaration under Section 564(b)(1) of the Act, 21 U.S.C. section 360bbb-3(b)(1), unless the authorization is terminated or revoked sooner.     Influenza A by PCR NEGATIVE NEGATIVE Final   Influenza B by PCR NEGATIVE NEGATIVE Final    Comment: (NOTE) The Xpert Xpress SARS-CoV-2/FLU/RSV assay is intended as an aid in  the diagnosis of influenza from Nasopharyngeal swab specimens and  should not be used as a sole basis for treatment. Nasal washings and  aspirates are unacceptable for Xpert Xpress SARS-CoV-2/FLU/RSV  testing.  Fact Sheet for Patients: PinkCheek.be  Fact Sheet for Healthcare Providers: GravelBags.it  This test is not yet approved or cleared by the [place] FDA and  has been authorized for detection and/or diagnosis of SARS-CoV-2 by  FDA under an Emergency Use Authorization (EUA). This EUA will remain  in effect (meaning this test can  be used) for the duration of the  Covid-19 declaration under Section 564(b)(1) of the Act, 21  U.S.C. section 360bbb-3(b)(1), unless the authorization is  terminated or revoked. Performed at [facility], [address] [place], [place] [address]          Radiology Studies: No results found.      Scheduled Meds: . amLODipine  5 mg Oral Daily  . enoxaparin (LOVENOX) injection  0.5 mg/kg Subcutaneous Q24H  . metoprolol tartrate  12.5 mg Oral BID  . risperiDONE  1 mg Oral BID  . sodium chloride flush  3 mL Intravenous Q12H  . thiamine injection  100 mg Intravenous Daily   Or  . thiamine  100 mg Oral Daily  . vitamin B-12  1,000 mcg Oral Daily   Continuous Infusions:  Assessment & Plan:   Principal Problem:   Dementia ([place]) Active Problems:   HTN (hypertension)   Paroxysmal atrial fibrillation with rapid ventricular response ([place])   Altered mental status  Taniaya Rudder  Griffinis a 74 y.o.femalewith medical history significant forhypertension whois admitted with A. fib with RVR.  Paroxysmal atrial fibrillation with RVR: Remains in sinus rhythm  On beta-blockers  Does require anticoagulation however due to her incompetency and confusion unable to place patient on anticoagulation at this time.  May need to readdress this as outpatient in near future when she is more stable.   Echo with normal EF    Question of altered mental status versus cognitive impairment/dementia: Brought in under IVC by EMS per patient's PCP due to concern of underlying dementia/cognitive impairment and inability to care for self per ED documentation. No family/close contact available on admission to provide further history. Per chart review, she is scheduled for apalliative care visit on 03/10/2020. CT head is negative for acute changes on admission. No evidence of infection. Possible she may have had a CVA given her A. Fib. Psychiatry was consulted to assess for competency-Per  psych patient has signs of memory impairment confusion, paranoia and some psychotic symptoms in her current condition patient is not capable of making rational decision about her own care.  Patient will be kept under IVC.  If she becomes agitated she may need medicine for that such as antipsychotics per psychiatry -10/30-Per psychiatry since patient remains confused will likely need social work intervention ultimately.  Psych does not  plan to change the commitment paper for now. Toxicology negative Covid negative MRI- no recent infarction, hemorrhage or mass.  Mild to moderate chronic microvascular ischemic changes.  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 11/1- Neurology following-need to r/o reversible causes of dementia . HIV neg. Low B12 ..>started on replacement nml folate, TSH. RPR pending Ammonia level nml 11/2- still on IVC as pt residence is not clear. DSS is trying to contact the person on her file per case mx.  Per neurology recommend outpatient detailed neuropsych testing and follow-up with dementia specialist. We will continue with thiamine supplementation Neurology signed off     Hypertension: Stable, continue with beta-blockers and amlodipine    Vitamine B12 deficiency- Started on p.o. supplementation  Consult PT/OT-recommend home health  DVT prophylaxis: Lovenox Code Status: Full Family Communication: None at bedside  Status is: Inpatient  Remains inpatient appropriate because:Unsafe d/c plan   Dispo: The patient is from: Home? Living situation unclear              Anticipated d/c is to: TBD              Anticipated d/c date is: 2 days              Patient currently is medically stable to d/c.Pt is on IVC. DSS is involved trying to contact person on chart . For now unclear where pt will be discharged to as her "living situation" is also unclear         LOS: 5 days   Time spent: 30 minutes with more than 50% on  [place], MD [facility] Pager [phone]  If 7PM-7AM, please contact night-coverage [email or web address] Password [facility] 03/03/2020, 8:29 AM

## 2020-03-04 DIAGNOSIS — I480 Paroxysmal atrial fibrillation: Secondary | ICD-10-CM | POA: Diagnosis not present

## 2020-03-04 DIAGNOSIS — I10 Essential (primary) hypertension: Secondary | ICD-10-CM | POA: Diagnosis not present

## 2020-03-04 NOTE — TOC Progression Note (Signed)
Transition of Care [facility]) - Progression Note    Patient Details  Name: Charlotte Lane MRN: [id number] Date of Birth: 11-08-1945  Transition of Care [facility]) CM/SW [facility], LCSW Phone Number: 03/04/2020, 12:28 PM  Clinical Narrative: Received voicemail from patient's son, Ronalee Belts [phone]), requesting call with update on how patient is doing. In his voicemail he mentioned that he thinks patient may be married to [name]. CSW has not heard this information. RN will call son to provide update. CSW will follow up likely tomorrow. DSS social worker left CSW voicemail as well stating she had found patient's brother, Leeann Must, and he would give son CSW contact information.     Expected Discharge Plan: [place] Barriers to Discharge: Continued Medical Work up, Other (comment) (Patient does not have capacity)  Expected Discharge Plan and Services Expected Discharge Plan: [place] In-house Referral: Clinical Social Work     Living arrangements for the past 2 months: Single Family Home                                       Social Determinants of Health (SDOH) Interventions    Readmission Risk Interventions No flowsheet data found.

## 2020-03-04 NOTE — Progress Notes (Signed)
Son came to visit patient. Son very emotional as he has'nt seen his Mother in six years. Son had only found out today from his uncle that she was admitted to the hospital.   Fuller Mandril, RN

## 2020-03-04 NOTE — Progress Notes (Signed)
PROGRESS NOTE    Charlotte Lane  XTG:[id number] DOB: 04-Apr-1946 DOA: 02/26/2020 PCP: [facility]  Brief Narrative:  presents to the ED under IVC via EMS for evaluation of A. fib with RVR.   10/28- psych consulted for competency 10/29-calmer today.MRI was not completed yesterday due to pts behavior/refusal/ 10/30-still remains confused, even after repeating to her multiple times where she is currently she is unable to tell me she is at hospital. Per psychiatry will not change commitment paperwork for now 11/1-confused, still doesn't know where . No overnight issue. Sitter at bedside. 11/2- last night patient suddenly became agitated.  Insisted to nursing staff that she wanted to leave and go home.  Psychiatry Dr. Weber Cooks was called.  Was given Geodon IV with also Ativan with good effect 11/3: Calm this a.m.  No complaints   Assessment & Plan:   Principal Problem:   Dementia ([place]) Active Problems:   HTN (hypertension)   Paroxysmal atrial fibrillation with rapid ventricular response (HCC)   Altered mental status  Paroxysmal atrial fibrillation with RVR: Remains in sinus rhythm  On beta-blockers  Does require anticoagulation however due to her incompetency and confusion unable to place patient on anticoagulation at this time.  May need to readdress this as outpatient in near future when she is more stable.   Echo with normal EF   Question of altered mental status versus cognitive impairment/dementia: Brought in under IVC by EMS per patient's PCP due to concern of underlying dementia/cognitive impairment and inability to care for self per ED documentation. No family/close contact available on admission to provide further history. Per chart review, she is scheduled for apalliative care visit on 03/10/2020. CT head is negative for acute changes on admission. No evidence of infection. Possible she may have had a CVA given her A. Fib. Psychiatry was  consulted to assess for competency-Per psych patient has signs of memory impairment confusion, paranoia and some psychotic symptoms in her current condition patient is not capable of making rational decision about her own care. Patient will be kept under IVC. If she becomes agitated she may need medicine for that such as antipsychotics per psychiatry -10/30-Per psychiatry since patient remains confused will likely need social work intervention ultimately. Psych does not plan to change the commitment paper for now. Toxicology negative Covid negative MRI- no recent infarction, hemorrhage or mass. Mild to moderate chronic microvascular ischemic changes. 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 11/3: Patient seen and examined.  Calm this morning.  Baseline difficult ascertain.  Apparently per case management the patient's son did happen to call this morning.  Still unclear exactly what the patient's living situation is with the appropriate disposition will be.  Hypertension: Stable, continue with beta-blockers and amlodipine   Vitamine B12 deficiency- Started on p.o. supplementation  Consult PT/OT-recommend home health  DVT prophylaxis: Lovenox Code Status: Full Family Communication: None today Disposition Plan: Status is: Inpatient  Remains inpatient appropriate because:Unsafe d/c plan   Dispo: The patient is from: Home              Anticipated d/c is to: Unclear at this time              Anticipated d/c date is: 2 days              Patient currently is medically stable to d/c.  Patient is currently medically stable for discharge however remains on IVC.  DSS involved.  Case  management also involved.  We do not have a safe disposition plan at this time.       Consultants:   Psychiatry  Procedures:   None  Antimicrobials:   None   Subjective: Seen and examined.  No distress.  No pain complaints.  Objective: Vitals:   03/03/20  2310 03/04/20 0414 03/04/20 0749 03/04/20 1200  BP: 101/76 (!) 129/54 133/72 135/63  Pulse: 62 (!) 58 71 (!) 56  Resp: 18 16 18 18   Temp: 97.6 F (36.4 C) 97.8 F (36.6 C) 98.3 F (36.8 C) (!) 97.5 F (36.4 C)  TempSrc: Oral Oral Oral Oral  SpO2: 98% 95% 99% 99%  Weight:      Height:        Intake/Output Summary (Last 24 hours) at 03/04/2020 1358 Last data filed at 03/04/2020 0959 Gross per 24 hour  Intake 33 ml  Output --  Net 33 ml   Filed Weights   02/27/20 0400 02/27/20 0617 02/27/20 2300  Weight: 56.9 kg 56.9 kg 95.4 kg    Examination:  General exam: No acute distress. Respiratory system: Poor respiratory effort.  Lungs clear.  Room air. Cardiovascular system: S1-S2 heard, regular rhythm, regular rate, no murmurs gastrointestinal system: Abdomen is nondistended, soft and nontender. No organomegaly or masses felt. Normal bowel sounds heard. Central nervous system: Alert, oriented x1.  no focal neurological deficits. Extremities: Symmetric 5 x 5 power. Skin: No rashes, lesions or ulcers Psychiatry: Judgement and insight appear impaired. Mood & affect flattened.     Data Reviewed: I have personally reviewed following labs and imaging studies  CBC: Recent Labs  Lab 02/26/20 2012 02/27/20 1219 03/03/20 0456  WBC 12.2* 7.9 9.7  NEUTROABS 6.8  --   --   HGB 15.4* 14.1 13.6  HCT 46.9* 41.8 40.7  MCV 89.3 87.6 89.5  PLT 296 261 654   Basic Metabolic Panel: Recent Labs  Lab 02/26/20 2012 02/27/20 1219  NA 141 139  K 3.6 3.8  CL 106 105  CO2 27 25  GLUCOSE 100* 170*  BUN 16 12  CREATININE 0.95 0.74  CALCIUM 9.3 9.4  MG 2.2  --    GFR: Estimated Creatinine Clearance: 64.8 mL/min (by C-G formula based on SCr of 0.74 mg/dL). Liver Function Tests: Recent Labs  Lab 02/26/20 2012  AST 17  ALT 12  ALKPHOS 90  BILITOT 0.9  PROT 7.3  ALBUMIN 4.0   No results for input(s): LIPASE, AMYLASE in the last 168 hours. Recent Labs  Lab 02/29/20 1404   AMMONIA 26   Coagulation Profile: No results for input(s): INR, PROTIME in the last 168 hours. Cardiac Enzymes: No results for input(s): CKTOTAL, CKMB, CKMBINDEX, TROPONINI in the last 168 hours. BNP (last 3 results) No results for input(s): PROBNP in the last 8760 hours. HbA1C: No results for input(s): HGBA1C in the last 72 hours. CBG: No results for input(s): GLUCAP in the last 168 hours. Lipid Profile: No results for input(s): CHOL, HDL, LDLCALC, TRIG, CHOLHDL, LDLDIRECT in the last 72 hours. Thyroid Function Tests: No results for input(s): TSH, T4TOTAL, FREET4, T3FREE, THYROIDAB in the last 72 hours. Anemia Panel: No results for input(s): VITAMINB12, FOLATE, FERRITIN, TIBC, IRON, RETICCTPCT in the last 72 hours. Sepsis Labs: Recent Labs  Lab 02/26/20 2012  LATICACIDVEN 1.4    Recent Results (from the past 240 hour(s))  Respiratory Panel by RT PCR (Flu A&B, Covid) - Nasopharyngeal Swab     Status: None   Collection Time: 02/26/20  8:12 PM   Specimen: Nasopharyngeal Swab  Result Value Ref Range Status   SARS Coronavirus 2 by RT PCR NEGATIVE NEGATIVE Final    Comment: (NOTE) SARS-CoV-2 target nucleic acids are NOT DETECTED.  The SARS-CoV-2 RNA is generally detectable in upper respiratoy specimens during the acute phase of infection. The lowest concentration of SARS-CoV-2 viral copies this assay can detect is 131 copies/mL. A negative result does not preclude SARS-Cov-2 infection and should not be used as the sole basis for treatment or other patient management decisions. A negative result may occur with  improper specimen collection/handling, submission of specimen other than nasopharyngeal swab, presence of viral mutation(s) within the areas targeted by this assay, and inadequate number of viral copies (<131 copies/mL). A negative result must be combined with clinical observations, patient history, and epidemiological information. The expected result is  Negative.  Fact Sheet for Patients:  PinkCheek.be  Fact Sheet for Healthcare Providers:  GravelBags.it  This test is no t yet approved or cleared by the [place] FDA and  has been authorized for detection and/or diagnosis of SARS-CoV-2 by FDA under an Emergency Use Authorization (EUA). This EUA will remain  in effect (meaning this test can be used) for the duration of the COVID-19 declaration under Section 564(b)(1) of the Act, 21 U.S.C. section 360bbb-3(b)(1), unless the authorization is terminated or revoked sooner.     Influenza A by PCR NEGATIVE NEGATIVE Final   Influenza B by PCR NEGATIVE NEGATIVE Final    Comment: (NOTE) The Xpert Xpress SARS-CoV-2/FLU/RSV assay is intended as an aid in  the diagnosis of influenza from Nasopharyngeal swab specimens and  should not be used as a sole basis for treatment. Nasal washings and  aspirates are unacceptable for Xpert Xpress SARS-CoV-2/FLU/RSV  testing.  Fact Sheet for Patients: PinkCheek.be  Fact Sheet for Healthcare Providers: GravelBags.it  This test is not yet approved or cleared by the [place] FDA and  has been authorized for detection and/or diagnosis of SARS-CoV-2 by  FDA under an Emergency Use Authorization (EUA). This EUA will remain  in effect (meaning this test can be used) for the duration of the  Covid-19 declaration under Section 564(b)(1) of the Act, 21  U.S.C. section 360bbb-3(b)(1), unless the authorization is  terminated or revoked. Performed at [facility], [address] [place], [place] [address]          Radiology Studies: No results found.      Scheduled Meds: . amLODipine  5 mg Oral Daily  . enoxaparin (LOVENOX) injection  0.5 mg/kg Subcutaneous Q24H  . metoprolol tartrate  12.5 mg Oral BID  . risperiDONE  1 mg Oral BID  . sodium chloride flush  3 mL  Intravenous Q12H  . thiamine injection  100 mg Intravenous Daily   Or  . thiamine  100 mg Oral Daily  . vitamin B-12  1,000 mcg Oral Daily   Continuous Infusions:   LOS: 6 days    Time spent: By 5 minutes    Sidney Ace, MD [facility] Pager [phone]  If 7PM-7AM, please contact night-coverage 03/04/2020, 1:58 PM

## 2020-03-05 DIAGNOSIS — I480 Paroxysmal atrial fibrillation: Secondary | ICD-10-CM | POA: Diagnosis not present

## 2020-03-05 DIAGNOSIS — I10 Essential (primary) hypertension: Secondary | ICD-10-CM | POA: Diagnosis not present

## 2020-03-05 NOTE — Progress Notes (Signed)
Occupational Therapy Treatment Patient Details Name: Charlotte Lane MRN: [id number] DOB: April 22, 1946 Today's Date: 03/05/2020    History of present illness Charlotte Lane is a 74 y.o. female with PMH significant for HTN, new onset Afib with RVR who was brought in under Involuntary commitment. She had a wellness check, was found to be in Afib. She refused coming to the ED by a physician and was brought in under IVC.   OT comments  During today's OT treatment, Charlotte Lane presents as grossly IND in ADL -- able to complete bed mobility, transfers, ambulation, grooming, bathing, and UB dressing with no AD, no LOB, no hesitation, no cues required. She is alert, pleasant, and eager to participate in therapy, and is able to say that she is "in the hospital" but unable to state the facility's name. She can identify that it is 2021 but does not know Lane or month, stating that she "never pays attention to that." She also states that she is eager to go home because she has "been here for about a month now." Therapist later questioned pt again re: how long she had been at the hospital, and she again stated "around one month." Pt stated that she had three dogs at home, but since she had to come to the hospital, her neighbor had taken the dogs to be "put to sleep." Charlotte Lane reports that she remains active at home and "likes being busy." She states that she drives into town and does her own shopping. She denies any falls in previous 12 months. Although Charlotte Lane is IND in ADL, she could benefit from [facility] and ongoing supervision, post DC, to improve her safety, strength, endurance, and functional mobility.   Follow Up Recommendations  Home health OT;Supervision - Intermittent    Equipment Recommendations  None recommended by OT    Recommendations for Other Services      Precautions / Restrictions Precautions Precautions: None Restrictions Weight Bearing Restrictions: No       Mobility Bed  Mobility Overal bed mobility: Independent                Transfers Overall transfer level: Independent Equipment used: None Transfers: Sit to/from Stand Sit to Stand: Independent         General transfer comment: IND in bed mobility, sit<stand, grooming at sink, UB dressing in standing. No AD used, no LOB    Balance Overall balance assessment: Independent Sitting-balance support: Feet supported;No upper extremity supported Sitting balance-Leahy Scale: Good     Standing balance support: No upper extremity supported;During functional activity Standing balance-Leahy Scale: Good                             ADL either performed or assessed with clinical judgement   ADL Overall ADL's : Independent                                             Vision Patient Visual Report: No change from baseline     Perception     Praxis      Cognition Arousal/Alertness: Awake/alert Behavior During Therapy: WFL for tasks assessed/performed Overall Cognitive Status: No family/caregiver present to determine baseline cognitive functioning  General Comments: Oriented to self, able to state she is in hospital but not name of facility. IDs year but not month        Exercises Other Exercises Other Exercises: UB dressing, grooming and hair brushing in standing   Shoulder Instructions       General Comments      Pertinent Vitals/ Pain       Pain Assessment: No/denies pain  Home Living                                          Prior Functioning/Environment              Frequency  Min 1X/week        Progress Toward Goals  OT Goals(current goals can now be found in the care plan section)  Progress towards OT goals: Progressing toward goals  Acute Rehab OT Goals Patient Stated Goal: To return home OT Goal Formulation: With patient Time For Goal Achievement:  03/16/20 Potential to Achieve Goals: Good  Plan Frequency remains appropriate    Co-evaluation                 AM-PAC OT "6 Clicks" Daily Activity     Outcome Measure   Help from another person eating meals?: None Help from another person taking care of personal grooming?: None Help from another person toileting, which includes using toliet, bedpan, or urinal?: None Help from another person bathing (including washing, rinsing, drying)?: A Little Help from another person to put on and taking off regular upper body clothing?: None Help from another person to put on and taking off regular lower body clothing?: None 6 Click Score: 23    End of Session    OT Visit Diagnosis: Other abnormalities of gait and mobility (R26.89)   Activity Tolerance Patient tolerated treatment well   Patient Left with nursing/sitter in room   Nurse Communication          Time: 5170-0174 OT Time Calculation (min): 10 min  Charges: OT General Charges $OT Visit: 1 Visit OT Treatments $Self Care/Home Management : 8-22 mins  Josiah Lobo, PhD, [place], OTR/L ascom [phone] 03/05/20, 2:01 PM '

## 2020-03-05 NOTE — TOC Progression Note (Addendum)
Transition of Care (TOC) - Progression Note    Patient Details  Name: Charlotte Lane MRN: [id number] Date of Birth: 01/01/1946  Transition of Care (TOC) CM/SW Contact   C , LCSW Phone Number: 03/05/2020, 11:48 AM  Clinical Narrative:  DSS social worker, Lucinda Wilson, came by to see patient today. CSW met with Ms. Wilson and discussed new developments. She said son is considering bringing her home with him. Will call son after 3:00 today (per son's request via yesterday's RN) to confirm details. Ms. Wilson said she drove by patient's home yesterday and that it was a nice house [place] a nice neighborhood. Yard well-maintained with a truck and motorcycle in the garage.   4:11 pm: Received call from patient's son. Discuss ALF vs. Home with home health. Emailed him ALF lists to review for [place], [place], and [place]. He confirmed he is considering bringing her home with him and will discuss with his family tonight. Son will talk to his boss tomorrow to see if he can get off work early to bring her home. He will also see if he can get next week off to stay with her at home. MD and psychiatrist are aware. Son will follow up in the morning after he talks to his boss.  Expected Discharge Plan: Skilled Nursing Facility Barriers to Discharge: Continued Medical Work up, Other (comment) (Patient does not have capacity)  Expected Discharge Plan and Services Expected Discharge Plan: Skilled Nursing Facility In-house Referral: Clinical Social Work     Living arrangements for the past 2 months: Single Family Home                                       Social Determinants of Health (SDOH) Interventions    Readmission Risk Interventions No flowsheet data found.  

## 2020-03-05 NOTE — Progress Notes (Signed)
Physical Therapy Treatment Patient Details Name: Charlotte Lane MRN: [id number] DOB: 02-12-1946 Today's Date: 03/05/2020    History of Present Illness MAKYIAH LIE is a 74 y.o. female with PMH significant for HTN, new onset Afib with RVR who was brought in under Involuntary commitment. She had a wellness check, was found to be in Afib. She refused coming to the ED by a physician and was brought in under IVC.    PT Comments    Out of bed with ease and able to complete 3 laps around unit with no AD and higher level balance activities including quick turns, backwards and directional changes.  Overall does well with mobility with no LOB or buckling.   Follow Up Recommendations  Home health PT     Equipment Recommendations  None recommended by PT    Recommendations for Other Services       Precautions / Restrictions Precautions Precautions: None Restrictions Weight Bearing Restrictions: No    Mobility  Bed Mobility Overal bed mobility: Independent                Transfers Overall transfer level: Independent                  Ambulation/Gait Ambulation/Gait assistance: Supervision Gait Distance (Feet): 320 Feet Assistive device: None Gait Pattern/deviations: Step-through pattern Gait velocity: Adequate       Stairs             Wheelchair Mobility    Modified Rankin (Stroke Patients Only)       Balance Overall balance assessment: Mild deficits observed, not formally tested                                          Cognition Arousal/Alertness: Awake/alert Behavior During Therapy: WFL for tasks assessed/performed Overall Cognitive Status: No family/caregiver present to determine baseline cognitive functioning                                        Exercises      General Comments        Pertinent Vitals/Pain Pain Assessment: No/denies pain    Home Living                      Prior  Function            PT Goals (current goals can now be found in the care plan section) Progress towards PT goals: Progressing toward goals    Frequency    Min 2X/week      PT Plan      Co-evaluation              AM-PAC PT "6 Clicks" Mobility   Outcome Measure  Help needed turning from your back to your side while in a flat bed without using bedrails?: None Help needed moving from lying on your back to sitting on the side of a flat bed without using bedrails?: None Help needed moving to and from a bed to a chair (including a wheelchair)?: None Help needed standing up from a chair using your arms (e.g., wheelchair or bedside chair)?: None Help needed to walk in hospital room?: A Little Help needed climbing 3-5 steps with a railing? : A Little 6 Click Score: 22  End of Session Equipment Utilized During Treatment: Gait belt Activity Tolerance: Patient tolerated treatment well Patient left: in bed;with nursing/sitter in room;with call bell/phone within reach Nurse Communication: Mobility status PT Visit Diagnosis: Unsteadiness on feet (R26.81);Other abnormalities of gait and mobility (R26.89);Muscle weakness (generalized) (M62.81);Difficulty in walking, not elsewhere classified (R26.2)     Time: 4975-3005 PT Time Calculation (min) (ACUTE ONLY): 9 min  Charges:  $Gait Training: 8-22 mins                    Chesley Noon, PTA 03/05/20, 12:52 PM

## 2020-03-05 NOTE — Care Management Important Message (Signed)
Important Message  Patient Details  Name: Charlotte Lane MRN: [id number] Date of Birth: 05/22/1945   Medicare Important Message Given:  Yes     [name] 03/05/2020, 1:09 PM

## 2020-03-05 NOTE — Progress Notes (Signed)
PROGRESS NOTE    Charlotte Lane  IBB:[id number] DOB: 1946-03-14 DOA: 02/26/2020 PCP: [facility]  Brief Narrative:  presents to the ED under IVC via EMS for evaluation of A. fib with RVR.   10/28- psych consulted for competency 10/29-calmer today.MRI was not completed yesterday due to pts behavior/refusal/ 10/30-still remains confused, even after repeating to her multiple times where she is currently she is unable to tell me she is at hospital. Per psychiatry will not change commitment paperwork for now 11/1-confused, still doesn't know where . No overnight issue. Sitter at bedside. 11/2- last night patient suddenly became agitated.  Insisted to nursing staff that she wanted to leave and go home.  Psychiatry Dr. Weber Cooks was called.  Was given Geodon IV with also Ativan with good effect 11/3: Calm this a.m.  No complaints 11/4: Patient seen and examined this morning.  Calm, no complaints.  Per case management and nursing the patient's son did visit yesterday.  Apparently this is the first time he is seen the patient in 6years.  Still pending safe disposition plan.   Assessment & Plan:   Principal Problem:   Dementia ([place]) Active Problems:   HTN (hypertension)   Paroxysmal atrial fibrillation with rapid ventricular response (HCC)   Altered mental status  Paroxysmal atrial fibrillation with RVR: Remains in sinus rhythm  On beta-blockers  Does require anticoagulation however due to her incompetency and confusion unable to place patient on anticoagulation at this time.  May need to readdress this as outpatient in near future when she is more stable.   Echo with normal EF   Question of altered mental status versus cognitive impairment/dementia: Brought in under IVC by EMS per patient's PCP due to concern of underlying dementia/cognitive impairment and inability to care for self per ED documentation. No family/close contact available on admission to provide  further history. Per chart review, she is scheduled for apalliative care visit on 03/10/2020. CT head is negative for acute changes on admission. No evidence of infection. Possible she may have had a CVA given her A. Fib. Psychiatry was consulted to assess for competency-Per psych patient has signs of memory impairment confusion, paranoia and some psychotic symptoms in her current condition patient is not capable of making rational decision about her own care. Patient will be kept under IVC. If she becomes agitated she may need medicine for that such as antipsychotics per psychiatry -10/30-Per psychiatry since patient remains confused will likely need social work intervention ultimately. Psych does not plan to change the commitment paper for now. Toxicology negative Covid negative MRI- no recent infarction, hemorrhage or mass. Mild to moderate chronic microvascular ischemic changes. 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 11/3: Patient seen and examined.  Calm this morning.  Baseline difficult ascertain.  Apparently per case management the patient's son did happen to call this morning.  Still unclear exactly what the patient's living situation is with the appropriate disposition will be.  Hypertension: Stable, continue with beta-blockers and amlodipine   Vitamine B12 deficiency- Started on p.o. supplementation  Consult PT/OT-recommend home health  DVT prophylaxis: Lovenox Code Status: Full Family Communication: None today Disposition Plan: Status is: Inpatient  Remains inpatient appropriate because:Unsafe d/c plan   Dispo: The patient is from: Home              Anticipated d/c is to: Unknown at this time  Anticipated d/c date is: > 3 days              Patient currently is medically stable to d/c.    Medically stable for discharge however we lack a safe disposition plan at this time.  DSS involved.  TOC involved.  Disposition  plan pending safe discharge   Consultants:   Psychiatry  Procedures:   None  Antimicrobials:   None   Subjective: Seen and examined.  Calm this a.m., not agitated.  No complaints.  Objective: Vitals:   03/05/20 0027 03/05/20 0458 03/05/20 0730 03/05/20 1147  BP: 131/78 137/71 121/66 117/64  Pulse: 66 67 60 68  Resp: 16 18    Temp: 98.4 F (36.9 C) 98 F (36.7 C) 97.6 F (36.4 C) 97.6 F (36.4 C)  TempSrc: Oral Oral Oral Oral  SpO2: 97% 99% 98% 99%  Weight:      Height:        Intake/Output Summary (Last 24 hours) at 03/05/2020 1234 Last data filed at 03/05/2020 0900 Gross per 24 hour  Intake 240 ml  Output --  Net 240 ml   Filed Weights   02/27/20 0400 02/27/20 0617 02/27/20 2300  Weight: 56.9 kg 56.9 kg 95.4 kg    Examination:  General exam: No acute distress. Respiratory system: Poor respiratory effort.  Lungs clear.  Room air. Cardiovascular system: S1-S2 heard, regular rhythm, regular rate, no murmurs gastrointestinal system: Abdomen is nondistended, soft and nontender. No organomegaly or masses felt. Normal bowel sounds heard. Central nervous system: Alert, oriented x1.  no focal neurological deficits. Extremities: Symmetric 5 x 5 power. Skin: No rashes, lesions or ulcers Psychiatry: Judgement and insight appear impaired. Mood & affect flattened.     Data Reviewed: I have personally reviewed following labs and imaging studies  CBC: Recent Labs  Lab 03/03/20 0456  WBC 9.7  HGB 13.6  HCT 40.7  MCV 89.5  PLT 485   Basic Metabolic Panel: No results for input(s): NA, K, CL, CO2, GLUCOSE, BUN, CREATININE, CALCIUM, MG, PHOS in the last 168 hours. GFR: Estimated Creatinine Clearance: 64.8 mL/min (by C-G formula based on SCr of 0.74 mg/dL). Liver Function Tests: No results for input(s): AST, ALT, ALKPHOS, BILITOT, PROT, ALBUMIN in the last 168 hours. No results for input(s): LIPASE, AMYLASE in the last 168 hours. Recent Labs  Lab  02/29/20 1404  AMMONIA 26   Coagulation Profile: No results for input(s): INR, PROTIME in the last 168 hours. Cardiac Enzymes: No results for input(s): CKTOTAL, CKMB, CKMBINDEX, TROPONINI in the last 168 hours. BNP (last 3 results) No results for input(s): PROBNP in the last 8760 hours. HbA1C: No results for input(s): HGBA1C in the last 72 hours. CBG: No results for input(s): GLUCAP in the last 168 hours. Lipid Profile: No results for input(s): CHOL, HDL, LDLCALC, TRIG, CHOLHDL, LDLDIRECT in the last 72 hours. Thyroid Function Tests: No results for input(s): TSH, T4TOTAL, FREET4, T3FREE, THYROIDAB in the last 72 hours. Anemia Panel: No results for input(s): VITAMINB12, FOLATE, FERRITIN, TIBC, IRON, RETICCTPCT in the last 72 hours. Sepsis Labs: No results for input(s): PROCALCITON, LATICACIDVEN in the last 168 hours.  Recent Results (from the past 240 hour(s))  Respiratory Panel by RT PCR (Flu A&B, Covid) - Nasopharyngeal Swab     Status: None   Collection Time: 02/26/20  8:12 PM   Specimen: Nasopharyngeal Swab  Result Value Ref Range Status   SARS Coronavirus 2 by RT PCR NEGATIVE NEGATIVE Final    Comment: (  NOTE) SARS-CoV-2 target nucleic acids are NOT DETECTED.  The SARS-CoV-2 RNA is generally detectable in upper respiratoy specimens during the acute phase of infection. The lowest concentration of SARS-CoV-2 viral copies this assay can detect is 131 copies/mL. A negative result does not preclude SARS-Cov-2 infection and should not be used as the sole basis for treatment or other patient management decisions. A negative result may occur with  improper specimen collection/handling, submission of specimen other than nasopharyngeal swab, presence of viral mutation(s) within the areas targeted by this assay, and inadequate number of viral copies (<131 copies/mL). A negative result must be combined with clinical observations, patient history, and epidemiological information.  The expected result is Negative.  Fact Sheet for Patients:  PinkCheek.be  Fact Sheet for Healthcare Providers:  GravelBags.it  This test is no t yet approved or cleared by the [place] FDA and  has been authorized for detection and/or diagnosis of SARS-CoV-2 by FDA under an Emergency Use Authorization (EUA). This EUA will remain  in effect (meaning this test can be used) for the duration of the COVID-19 declaration under Section 564(b)(1) of the Act, 21 U.S.C. section 360bbb-3(b)(1), unless the authorization is terminated or revoked sooner.     Influenza A by PCR NEGATIVE NEGATIVE Final   Influenza B by PCR NEGATIVE NEGATIVE Final    Comment: (NOTE) The Xpert Xpress SARS-CoV-2/FLU/RSV assay is intended as an aid in  the diagnosis of influenza from Nasopharyngeal swab specimens and  should not be used as a sole basis for treatment. Nasal washings and  aspirates are unacceptable for Xpert Xpress SARS-CoV-2/FLU/RSV  testing.  Fact Sheet for Patients: PinkCheek.be  Fact Sheet for Healthcare Providers: GravelBags.it  This test is not yet approved or cleared by the [place] FDA and  has been authorized for detection and/or diagnosis of SARS-CoV-2 by  FDA under an Emergency Use Authorization (EUA). This EUA will remain  in effect (meaning this test can be used) for the duration of the  Covid-19 declaration under Section 564(b)(1) of the Act, 21  U.S.C. section 360bbb-3(b)(1), unless the authorization is  terminated or revoked. Performed at [facility], [address], [place],  [address]          Radiology Studies: No results found.      Scheduled Meds:  amLODipine  5 mg Oral Daily   enoxaparin (LOVENOX) injection  0.5 mg/kg Subcutaneous Q24H   metoprolol tartrate  12.5 mg Oral BID   risperiDONE  1 mg Oral BID   sodium  chloride flush  3 mL Intravenous Q12H   thiamine injection  100 mg Intravenous Daily   Or   thiamine  100 mg Oral Daily   vitamin B-12  1,000 mcg Oral Daily   Continuous Infusions:   LOS: 7 days    Time spent: 15 minutes    Sidney Ace, MD [facility] Pager [phone]  If 7PM-7AM, please contact night-coverage 03/05/2020, 12:34 PM

## 2020-03-06 DIAGNOSIS — I10 Essential (primary) hypertension: Secondary | ICD-10-CM | POA: Diagnosis not present

## 2020-03-06 DIAGNOSIS — I480 Paroxysmal atrial fibrillation: Secondary | ICD-10-CM | POA: Diagnosis not present

## 2020-03-06 NOTE — Progress Notes (Signed)
Received a call from [place] stating son and lawyers were visiting her and asking to be permitted to see patient. The unit secretary let me know the visitors were present, upon entering the room, this nurse heard the lawyers asking patient about legal items. The patient declined and the lawyer then asked about POAI stopped the conversation and asked if he was the son, he stated "no I am the husband." The partner attempted to talk to her more and I notified the lawyer that the doctors have deemed her incapable of making her own decisions and DSS is involved. The lawyer gathered his things and left.  The Case Manager was notified, and she notified the son and left a message for the DSS case manager. Notified medical mall that visitors are limited and will require ID. Password set on account. I did speak with son, Marlou Sa, and informed him that ID will be required to enter.

## 2020-03-06 NOTE — Progress Notes (Addendum)
Physical Therapy Treatment Patient Details Name: Charlotte Lane MRN: [id number] DOB: Feb 05, 1946 Today's Date: 03/06/2020    History of Present Illness Charlotte Lane is a 74 y.o. female with PMH significant for HTN, new onset Afib with RVR who was brought in under Involuntary commitment. She had a wellness check, was found to be in Afib. She refused coming to the ED by a physician and was brought in under IVC.    PT Comments    Pt pleasantly confused & agreeable to tx. Pt can ambulate increased distances without AD or physical assist except for 1 LOB with min assist to correct. Pt perseverative on wearing socks vs her tennis shoes. Pt completes Berg Balance Test & scores 42/56 indicating significant fall risk & supporting need for high level balance training. Will continue to follow pt acutely to focus on deficits noted.     Follow Up Recommendations  Home health PT     Equipment Recommendations  None recommended by PT    Recommendations for Other Services       Precautions / Restrictions Precautions Precautions: None Restrictions Weight Bearing Restrictions: No    Mobility  Bed Mobility Overal bed mobility: Independent                Transfers Overall transfer level: Independent Equipment used: None Transfers: Sit to/from Stand Sit to Stand: Independent            Ambulation/Gait Ambulation/Gait assistance: Supervision Gait Distance (Feet): 430 Feet (4 laps + 1 lap around nurses station) Assistive device: None Gait Pattern/deviations: Step-through pattern     General Gait Details: 1 LOB to R when turning to R, pt stabilizing herself on counter & min assist from PT   Stairs             Wheelchair Mobility    Modified Rankin (Stroke Patients Only)       Balance                                 Standardized Balance Assessment Standardized Balance Assessment : Berg Balance Test Berg Balance Test Sit to Stand: Able to stand  without using hands and stabilize independently Standing Unsupported: Able to stand safely 2 minutes Sitting with Back Unsupported but Feet Supported on Floor or Stool: Able to sit safely and securely 2 minutes Stand to Sit: Sits safely with minimal use of hands Transfers: Able to transfer safely, minor use of hands Standing Unsupported with Eyes Closed: Able to stand 10 seconds with supervision Standing Ubsupported with Feet Together: Able to place feet together independently and stand for 1 minute with supervision From Standing, Reach Forward with Outstretched Arm: Can reach confidently >25 cm (10") From Standing Position, Pick up Object from Floor: Able to pick up shoe, needs supervision From Standing Position, Turn to Look Behind Over each Shoulder: Needs supervision when turning (minimal rotation to B sides) Turn 360 Degrees: Needs close supervision or verbal cueing Standing Unsupported, Alternately Place Feet on Step/Stool: Able to complete 4 steps without aid or supervision (supervision with cuing to complete task) Standing Unsupported, One Foot in Front: Able to take small step independently and hold 30 seconds Standing on One Leg: Able to lift leg independently and hold 5-10 seconds Total Score: 42        Cognition Arousal/Alertness: Awake/alert Behavior During Therapy: WFL for tasks assessed/performed Overall Cognitive Status: No family/caregiver present to determine baseline cognitive functioning  General Comments: Oriented to self, able to state she is in hospital but not name of facility. IDs year but not month      Exercises      General Comments        Pertinent Vitals/Pain Pain Assessment: No/denies pain    Home Living                      Prior Function            PT Goals (current goals can now be found in the care plan section) Acute Rehab PT Goals Patient Stated Goal: To return home Progress  towards PT goals: Progressing toward goals    Frequency    Min 2X/week      PT Plan Current plan remains appropriate    Co-evaluation              AM-PAC PT "6 Clicks" Mobility   Outcome Measure  Help needed turning from your back to your side while in a flat bed without using bedrails?: None Help needed moving from lying on your back to sitting on the side of a flat bed without using bedrails?: None Help needed moving to and from a bed to a chair (including a wheelchair)?: None Help needed standing up from a chair using your arms (e.g., wheelchair or bedside chair)?: None Help needed to walk in hospital room?: A Little Help needed climbing 3-5 steps with a railing? : A Little 6 Click Score: 22    End of Session Equipment Utilized During Treatment: Gait belt Activity Tolerance: Patient tolerated treatment well Patient left: in bed;with nursing/sitter in room;with call bell/phone within reach Nurse Communication: Mobility status PT Visit Diagnosis: Unsteadiness on feet (R26.81);Other abnormalities of gait and mobility (R26.89);Muscle weakness (generalized) (M62.81);Difficulty in walking, not elsewhere classified (R26.2)     Time: 2992-4268 PT Time Calculation (min) (ACUTE ONLY): 23 min  Charges:  $Therapeutic Activity: 23-37 mins                     Lavone Nian, PT, DPT 03/06/20, 4:06 PM    Waunita Schooner 03/06/2020, 4:05 PM

## 2020-03-06 NOTE — Progress Notes (Signed)
PROGRESS NOTE    Charlotte Lane  YQM:[id number] DOB: [birth year] DOA: 02/26/2020 PCP: [facility]  Brief Narrative:  presents to the ED under IVC via EMS for evaluation of A. fib with RVR.   10/28- psych consulted for competency 10/29-calmer today.MRI was not completed yesterday due to pts behavior/refusal/ 10/30-still remains confused, even after repeating to her multiple times where she is currently she is unable to tell me she is at hospital. Per psychiatry will not change commitment paperwork for now 11/1-confused, still doesn't know where . No overnight issue. Sitter at bedside. 11/2- last night patient suddenly became agitated.  Insisted to nursing staff that she wanted to leave and go home.  Psychiatry Dr. Weber Cooks was called.  Was given Geodon IV with also Ativan with good effect 11/3: Calm this a.m.  No complaints 11/4: Patient seen and examined this morning.  Calm, no complaints.  Per case management and nursing the patient's son did visit yesterday.  Apparently this is the first time he is seen the patient in 6years.  Still pending safe disposition plan. 11/5: Patient son was contacted, indicated that home may not be a possible disposition for her.  Case management aware and looking into assisted living facilities.   Assessment & Plan:   Principal Problem:   Dementia ([place]) Active Problems:   HTN (hypertension)   Paroxysmal atrial fibrillation with rapid ventricular response (HCC)   Altered mental status  Paroxysmal atrial fibrillation with RVR: Remains in sinus rhythm  On beta-blockers  Does require anticoagulation however due to her incompetency and confusion unable to place patient on anticoagulation at this time.  May need to readdress this as outpatient in near future when she is more stable.   Echo with normal EF   Question of altered mental status versus cognitive impairment/dementia: Brought in under IVC by EMS per patient's PCP due  to concern of underlying dementia/cognitive impairment and inability to care for self per ED documentation. No family/close contact available on admission to provide further history. Per chart review, she is scheduled for apalliative care visit on 03/10/2020. CT head is negative for acute changes on admission. No evidence of infection. Possible she may have had a CVA given her A. Fib. Psychiatry was consulted to assess for competency-Per psych patient has signs of memory impairment confusion, paranoia and some psychotic symptoms in her current condition patient is not capable of making rational decision about her own care. Patient will be kept under IVC. If she becomes agitated she may need medicine for that such as antipsychotics per psychiatry -10/30-Per psychiatry since patient remains confused will likely need social work intervention ultimately. Psych does not plan to change the commitment paper for now. Toxicology negative Covid negative MRI- no recent infarction, hemorrhage or mass. Mild to moderate chronic microvascular ischemic changes. 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    Hypertension: Stable, continue with beta-blockers and amlodipine   Vitamine B12 deficiency- Started on p.o. supplementation  Consult PT/OT-recommend home health  DVT prophylaxis: Lovenox Code Status: Full Family Communication: None today Disposition Plan: Status is: Inpatient  Remains inpatient appropriate because:Unsafe d/c plan   Dispo: The patient is from: Home              Anticipated d/c is to: Unknown at this time              Anticipated d/c date is: > 3 days  Patient currently is medically stable to d/c.    Patient medically stable for discharge.  Continue to lack a safe disposition plan.  Per the patient's son going home is likely not a possibility.  Case management aware and looking into assisted living facility  options.   Consultants:   Psychiatry  Procedures:   None  Antimicrobials:   None   Subjective: Seen and examined.  Eating this morning, pleasant in no distress.  No complaints  Objective: Vitals:   03/05/20 1957 03/06/20 0026 03/06/20 0547 03/06/20 0956  BP: 135/60 (!) 112/50 133/61 139/64  Pulse: 72 (!) 57 68 70  Resp: 16 16 16    Temp: 98.8 F (37.1 C) 99 F (37.2 C) 97.6 F (36.4 C) (!) 97.4 F (36.3 C)  TempSrc: Oral Oral Oral Oral  SpO2: 97% 95% 97% 98%  Weight:      Height:        Intake/Output Summary (Last 24 hours) at 03/06/2020 1224 Last data filed at 03/06/2020 1052 Gross per 24 hour  Intake 480 ml  Output --  Net 480 ml   Filed Weights   02/27/20 0400 02/27/20 0617 02/27/20 2300  Weight: 56.9 kg 56.9 kg 95.4 kg    Examination:  General exam: No acute distress. Respiratory system: Poor respiratory effort.  Lungs clear.  Room air. Cardiovascular system: S1-S2 heard, regular rhythm, regular rate, no murmurs gastrointestinal system: Abdomen is nondistended, soft and nontender. No organomegaly or masses felt. Normal bowel sounds heard. Central nervous system: Alert, oriented x1.  no focal neurological deficits. Extremities: Symmetric 5 x 5 power. Skin: No rashes, lesions or ulcers Psychiatry: Judgement and insight appear impaired. Mood & affect flattened.     Data Reviewed: I have personally reviewed following labs and imaging studies  CBC: Recent Labs  Lab 03/03/20 0456  WBC 9.7  HGB 13.6  HCT 40.7  MCV 89.5  PLT 086   Basic Metabolic Panel: No results for input(s): NA, K, CL, CO2, GLUCOSE, BUN, CREATININE, CALCIUM, MG, PHOS in the last 168 hours. GFR: Estimated Creatinine Clearance: 64.8 mL/min (by C-G formula based on SCr of 0.74 mg/dL). Liver Function Tests: No results for input(s): AST, ALT, ALKPHOS, BILITOT, PROT, ALBUMIN in the last 168 hours. No results for input(s): LIPASE, AMYLASE in the last 168 hours. Recent Labs  Lab  02/29/20 1404  AMMONIA 26   Coagulation Profile: No results for input(s): INR, PROTIME in the last 168 hours. Cardiac Enzymes: No results for input(s): CKTOTAL, CKMB, CKMBINDEX, TROPONINI in the last 168 hours. BNP (last 3 results) No results for input(s): PROBNP in the last 8760 hours. HbA1C: No results for input(s): HGBA1C in the last 72 hours. CBG: No results for input(s): GLUCAP in the last 168 hours. Lipid Profile: No results for input(s): CHOL, HDL, LDLCALC, TRIG, CHOLHDL, LDLDIRECT in the last 72 hours. Thyroid Function Tests: No results for input(s): TSH, T4TOTAL, FREET4, T3FREE, THYROIDAB in the last 72 hours. Anemia Panel: No results for input(s): VITAMINB12, FOLATE, FERRITIN, TIBC, IRON, RETICCTPCT in the last 72 hours. Sepsis Labs: No results for input(s): PROCALCITON, LATICACIDVEN in the last 168 hours.  Recent Results (from the past 240 hour(s))  Respiratory Panel by RT PCR (Flu A&B, Covid) - Nasopharyngeal Swab     Status: None   Collection Time: 02/26/20  8:12 PM   Specimen: Nasopharyngeal Swab  Result Value Ref Range Status   SARS Coronavirus 2 by RT PCR NEGATIVE NEGATIVE Final    Comment: (NOTE) SARS-CoV-2 target nucleic acids  are NOT DETECTED.  The SARS-CoV-2 RNA is generally detectable in upper respiratoy specimens during the acute phase of infection. The lowest concentration of SARS-CoV-2 viral copies this assay can detect is 131 copies/mL. A negative result does not preclude SARS-Cov-2 infection and should not be used as the sole basis for treatment or other patient management decisions. A negative result may occur with  improper specimen collection/handling, submission of specimen other than nasopharyngeal swab, presence of viral mutation(s) within the areas targeted by this assay, and inadequate number of viral copies (<131 copies/mL). A negative result must be combined with clinical observations, patient history, and epidemiological information.  The expected result is Negative.  Fact Sheet for Patients:  PinkCheek.be  Fact Sheet for Healthcare Providers:  GravelBags.it  This test is no t yet approved or cleared by the [place] FDA and  has been authorized for detection and/or diagnosis of SARS-CoV-2 by FDA under an Emergency Use Authorization (EUA). This EUA will remain  in effect (meaning this test can be used) for the duration of the COVID-19 declaration under Section 564(b)(1) of the Act, 21 U.S.C. section 360bbb-3(b)(1), unless the authorization is terminated or revoked sooner.     Influenza A by PCR NEGATIVE NEGATIVE Final   Influenza B by PCR NEGATIVE NEGATIVE Final    Comment: (NOTE) The Xpert Xpress SARS-CoV-2/FLU/RSV assay is intended as an aid in  the diagnosis of influenza from Nasopharyngeal swab specimens and  should not be used as a sole basis for treatment. Nasal washings and  aspirates are unacceptable for Xpert Xpress SARS-CoV-2/FLU/RSV  testing.  Fact Sheet for Patients: PinkCheek.be  Fact Sheet for Healthcare Providers: GravelBags.it  This test is not yet approved or cleared by the [place] FDA and  has been authorized for detection and/or diagnosis of SARS-CoV-2 by  FDA under an Emergency Use Authorization (EUA). This EUA will remain  in effect (meaning this test can be used) for the duration of the  Covid-19 declaration under Section 564(b)(1) of the Act, 21  U.S.C. section 360bbb-3(b)(1), unless the authorization is  terminated or revoked. Performed at [facility], [address], [place], [place] [address]          Radiology Studies: No results found.      Scheduled Meds: . amLODipine  5 mg Oral Daily  . enoxaparin (LOVENOX) injection  0.5 mg/kg Subcutaneous Q24H  . metoprolol tartrate  12.5 mg Oral BID  . risperiDONE  1 mg Oral BID  . sodium  chloride flush  3 mL Intravenous Q12H  . thiamine injection  100 mg Intravenous Daily   Or  . thiamine  100 mg Oral Daily  . vitamin B-12  1,000 mcg Oral Daily   Continuous Infusions:   LOS: 8 days    Time spent: 15 minutes    Sidney Ace, MD [facility] Pager [phone]  If 7PM-7AM, please contact night-coverage 03/06/2020, 12:24 PM

## 2020-03-06 NOTE — TOC Progression Note (Addendum)
Transition of Care [facility]) - Progression Note    Patient Details  Name: Charlotte Lane MRN: [id number] Date of Birth: [birth year]  [facility]) CM/SW [place], LCSW Phone Number: 03/06/2020, 10:52 AM  Clinical Narrative:  Received call from son. He discussed bringing patient home with his wife last night and they decided this was not the best option at this time. Son has called around to a few ALF's in [place] but they are not taking Medicaid. CSW will call around to other ALF's and send referral out to [facility]. Son said [facility] is best option because he works in [place], lives in [place], and his daughter lives in [place].   12:47 pm: Son going to visit [facility] because per APS social worker, they have Medicaid beds available. Son will call CSW if he approves so referral can be sent over as soon as possible.   1:58 pm: Received call back from son. He has toured [facility] in [place] and would like patient to admit there. CSW faxed referral.  4:13 pm: Received call from [place] social worker. Son is going to apply for special assistance Medicaid and they need a letter of incompetence. MD completed and CSW faxed to [place] social worker. CSW left her a voicemail to notify.  4:33 pm: Son is currently at [place] working on special assistance Medicaid. Social worker he was meeting with confirmed they received the letter of incompetence. Son is going to meet with a lawyer regarding guardianship as well.  Expected Discharge Plan: [place] Barriers to Discharge: Continued Medical Work up, Other (comment) (Patient does not have capacity)  Expected Discharge Plan and Services Expected Discharge Plan: [place] In-house Referral: Clinical Social Work     Living arrangements for the past 2 months: Single Family Home                                       Social Determinants of Health  (SDOH) Interventions    Readmission Risk Interventions No flowsheet data found.

## 2020-03-06 NOTE — NC FL2 (Signed)
[place] LEVEL OF CARE SCREENING TOOL     IDENTIFICATION  Patient Name: Charlotte Lane Birthdate: 02/22/1946 Sex: female Admission Date (Current Location): 02/26/2020  [facility] and [place] Number:  [name] (Son lives in [place])   Facility and Address:  [facility], [address], [place], [place] [address]      Provider Number: [id number]  Attending Physician Name and Address:  Sidney Ace, MD  Relative Name and Phone Number:       Current Level of Care: Hospital Recommended Level of Care: [place] Prior Approval Number:    Date Approved/Denied:   PASRR Number:    Discharge Plan: Other (Comment) (ALF)    Current Diagnoses: Patient Active Problem List   Diagnosis Date Noted  . Dementia ([place]) 02/27/2020  . Paroxysmal atrial fibrillation with rapid ventricular response ([place]) 02/26/2020  . Altered mental status 02/26/2020  . Sebaceous cyst   . HTN (hypertension) 06/25/2015  . Chest pain with moderate risk for cardiac etiology 06/25/2015  . Chest pain 01/23/2013    Orientation RESPIRATION BLADDER Height & Weight     Self  Normal Continent Weight: 210 lb 5.1 oz (95.4 kg) Height:  5' (152.4 cm)  BEHAVIORAL SYMPTOMS/MOOD NEUROLOGICAL BOWEL NUTRITION STATUS   (None)  (Dementia) Continent Diet (Heart healthy)  AMBULATORY STATUS COMMUNICATION OF NEEDS Skin   Supervision Verbally Normal                       Personal Care Assistance Level of Assistance  Bathing, Feeding, Dressing Bathing Assistance: Independent Feeding assistance: Independent Dressing Assistance: Independent     Functional Limitations Info  Sight, Hearing, Speech Sight Info: Adequate Hearing Info: Adequate Speech Info: Adequate    SPECIAL CARE FACTORS FREQUENCY                       Contractures Contractures Info: Not present    Additional Factors Info  Code Status, Allergies Code Status Info: Full  code Allergies Info: NKDA           Current Medications (03/06/2020):  This is the current hospital active medication list Current Facility-Administered Medications  Medication Dose Route Frequency Provider Last Rate Last Admin  . acetaminophen (TYLENOL) tablet 650 mg  650 mg Oral Q6H PRN Lenore Cordia, MD       Or  . acetaminophen (TYLENOL) suppository 650 mg  650 mg Rectal Q6H PRN Zada Finders R, MD      . amLODipine (NORVASC) tablet 5 mg  5 mg Oral Daily Fritzi Mandes, MD   5 mg at 03/06/20 0952  . enoxaparin (LOVENOX) injection 47.5 mg  0.5 mg/kg Subcutaneous Q24H Rocky Morel, RPH   47.5 mg at 03/05/20 2148  . metoprolol tartrate (LOPRESSOR) tablet 12.5 mg  12.5 mg Oral BID Zada Finders R, MD   12.5 mg at 03/06/20 1004  . risperiDONE (RISPERDAL M-TABS) disintegrating tablet 1 mg  1 mg Oral BID Clapacs, Madie Reno, MD   1 mg at 03/06/20 0952  . sodium chloride flush (NS) 0.9 % injection 3 mL  3 mL Intravenous Q12H Zada Finders R, MD   3 mL at 03/06/20 1004  . thiamine (B-1) injection 100 mg  100 mg Intravenous Daily Donnetta Simpers, MD       Or  . thiamine tablet 100 mg  100 mg Oral Daily Donnetta Simpers, MD   100 mg at 03/06/20 0952  .  vitamin B-12 (CYANOCOBALAMIN) tablet 1,000 mcg  1,000 mcg Oral Daily Donnetta Simpers, MD   1,000 mcg at 03/06/20 0149     Discharge Medications: Please see discharge summary for a list of discharge medications.  Relevant Imaging Results:  Relevant Lab Results:   Additional Information SS#: [id number]  Candie Chroman, LCSW

## 2020-03-07 DIAGNOSIS — I480 Paroxysmal atrial fibrillation: Secondary | ICD-10-CM | POA: Diagnosis not present

## 2020-03-07 DIAGNOSIS — I10 Essential (primary) hypertension: Secondary | ICD-10-CM | POA: Diagnosis not present

## 2020-03-07 NOTE — Progress Notes (Signed)
PROGRESS NOTE    Charlotte Lane  EKC:[id number] DOB: 14-Nov-1945 DOA: 02/26/2020 PCP: [facility]  Brief Narrative:  presents to the ED under IVC via EMS for evaluation of A. fib with RVR.   10/28- psych consulted for competency 10/29-calmer today.MRI was not completed yesterday due to pts behavior/refusal/ 10/30-still remains confused, even after repeating to her multiple times where she is currently she is unable to tell me she is at hospital. Per psychiatry will not change commitment paperwork for now 11/1-confused, still doesn't know where . No overnight issue. Sitter at bedside. 11/2- last night patient suddenly became agitated.  Insisted to nursing staff that she wanted to leave and go home.  Psychiatry Dr. Weber Cooks was called.  Was given Geodon IV with also Ativan with good effect 11/3: Calm this a.m.  No complaints 11/4: Patient seen and examined this morning.  Calm, no complaints.  Per case management and nursing the patient's son did visit yesterday.  Apparently this is the first time he is seen the patient in 6years.  Still pending safe disposition plan. 11/5: Patient son was contacted, indicated that home may not be a possible disposition for her.  Case management aware and looking into assisted living facilities.   Assessment & Plan:   Principal Problem:   Dementia ([place]) Active Problems:   HTN (hypertension)   Paroxysmal atrial fibrillation with rapid ventricular response (HCC)   Altered mental status  Paroxysmal atrial fibrillation with RVR: Remains in sinus rhythm  On beta-blockers  Does require anticoagulation however due to her incompetency and confusion unable to place patient on anticoagulation at this time.  May need to readdress this as outpatient in near future when she is more stable.   Echo with normal EF   Question of altered mental status versus cognitive impairment/dementia: Brought in under IVC by EMS per patient's PCP due  to concern of underlying dementia/cognitive impairment and inability to care for self per ED documentation. No family/close contact available on admission to provide further history. Per chart review, she is scheduled for apalliative care visit on 03/10/2020. CT head is negative for acute changes on admission. No evidence of infection. Possible she may have had a CVA given her A. Fib. Psychiatry was consulted to assess for competency-Per psych patient has signs of memory impairment confusion, paranoia and some psychotic symptoms in her current condition patient is not capable of making rational decision about her own care. Patient will be kept under IVC. If she becomes agitated she may need medicine for that such as antipsychotics per psychiatry -10/30-Per psychiatry since patient remains confused will likely need social work intervention ultimately. Psych does not plan to change the commitment paper for now. Toxicology negative Covid negative MRI- no recent infarction, hemorrhage or mass. Mild to moderate chronic microvascular ischemic changes. 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    Hypertension: Stable, continue with beta-blockers and amlodipine   Vitamine B12 deficiency- Started on p.o. supplementation  Consult PT/OT-recommend home health  DVT prophylaxis: Lovenox Code Status: Full Family Communication: None today Disposition Plan: Status is: Inpatient  Remains inpatient appropriate because:Unsafe d/c plan   Dispo: The patient is from: Home              Anticipated d/c is to: Unknown at this time              Anticipated d/c date is: > 3 days  Patient currently is medically stable to d/c.    Patient medically stable for discharge.  Continue to lack a safe disposition plan.  Per the patient's son going home is likely not a possibility.  Case management aware and looking into assisted living facility  options.   Consultants:   Psychiatry  Procedures:   None  Antimicrobials:   None   Subjective: Seen and examined.  Eating this morning, pleasant in no distress.  No complaints  Objective: Vitals:   03/06/20 0956 03/06/20 2039 03/06/20 2345 03/07/20 0657  BP: 139/64 (!) 117/55 114/64 120/66  Pulse: 70 63 64 63  Resp:  18 18 18   Temp: (!) 97.4 F (36.3 C) 97.7 F (36.5 C) 98.4 F (36.9 C) 98 F (36.7 C)  TempSrc: Oral Oral Oral   SpO2: 98% 99% 98% 96%  Weight:      Height:       No intake or output data in the 24 hours ending 03/07/20 1156 Filed Weights   02/27/20 0400 02/27/20 0617 02/27/20 2300  Weight: 56.9 kg 56.9 kg 95.4 kg    Examination:  General exam: No acute distress. Respiratory system: Poor respiratory effort.  Lungs clear.  Room air. Cardiovascular system: S1-S2 heard, regular rhythm, regular rate, no murmurs gastrointestinal system: Abdomen is nondistended, soft and nontender. No organomegaly or masses felt. Normal bowel sounds heard. Central nervous system: Alert, oriented x1.  no focal neurological deficits. Extremities: Symmetric 5 x 5 power. Skin: No rashes, lesions or ulcers Psychiatry: Judgement and insight appear impaired. Mood & affect flattened.     Data Reviewed: I have personally reviewed following labs and imaging studies  CBC: Recent Labs  Lab 03/03/20 0456  WBC 9.7  HGB 13.6  HCT 40.7  MCV 89.5  PLT 979   Basic Metabolic Panel: No results for input(s): NA, K, CL, CO2, GLUCOSE, BUN, CREATININE, CALCIUM, MG, PHOS in the last 168 hours. GFR: Estimated Creatinine Clearance: 64.8 mL/min (by C-G formula based on SCr of 0.74 mg/dL). Liver Function Tests: No results for input(s): AST, ALT, ALKPHOS, BILITOT, PROT, ALBUMIN in the last 168 hours. No results for input(s): LIPASE, AMYLASE in the last 168 hours. Recent Labs  Lab 02/29/20 1404  AMMONIA 26   Coagulation Profile: No results for input(s): INR, PROTIME in the last  168 hours. Cardiac Enzymes: No results for input(s): CKTOTAL, CKMB, CKMBINDEX, TROPONINI in the last 168 hours. BNP (last 3 results) No results for input(s): PROBNP in the last 8760 hours. HbA1C: No results for input(s): HGBA1C in the last 72 hours. CBG: No results for input(s): GLUCAP in the last 168 hours. Lipid Profile: No results for input(s): CHOL, HDL, LDLCALC, TRIG, CHOLHDL, LDLDIRECT in the last 72 hours. Thyroid Function Tests: No results for input(s): TSH, T4TOTAL, FREET4, T3FREE, THYROIDAB in the last 72 hours. Anemia Panel: No results for input(s): VITAMINB12, FOLATE, FERRITIN, TIBC, IRON, RETICCTPCT in the last 72 hours. Sepsis Labs: No results for input(s): PROCALCITON, LATICACIDVEN in the last 168 hours.  Recent Results (from the past 240 hour(s))  Respiratory Panel by RT PCR (Flu A&B, Covid) - Nasopharyngeal Swab     Status: None   Collection Time: 02/26/20  8:12 PM   Specimen: Nasopharyngeal Swab  Result Value Ref Range Status   SARS Coronavirus 2 by RT PCR NEGATIVE NEGATIVE Final    Comment: (NOTE) SARS-CoV-2 target nucleic acids are NOT DETECTED.  The SARS-CoV-2 RNA is generally detectable in upper respiratoy specimens during the acute phase of infection. The  lowest concentration of SARS-CoV-2 viral copies this assay can detect is 131 copies/mL. A negative result does not preclude SARS-Cov-2 infection and should not be used as the sole basis for treatment or other patient management decisions. A negative result may occur with  improper specimen collection/handling, submission of specimen other than nasopharyngeal swab, presence of viral mutation(s) within the areas targeted by this assay, and inadequate number of viral copies (<131 copies/mL). A negative result must be combined with clinical observations, patient history, and epidemiological information. The expected result is Negative.  Fact Sheet for Patients:   PinkCheek.be  Fact Sheet for Healthcare Providers:  GravelBags.it  This test is no t yet approved or cleared by the [place] FDA and  has been authorized for detection and/or diagnosis of SARS-CoV-2 by FDA under an Emergency Use Authorization (EUA). This EUA will remain  in effect (meaning this test can be used) for the duration of the COVID-19 declaration under Section 564(b)(1) of the Act, 21 U.S.C. section 360bbb-3(b)(1), unless the authorization is terminated or revoked sooner.     Influenza A by PCR NEGATIVE NEGATIVE Final   Influenza B by PCR NEGATIVE NEGATIVE Final    Comment: (NOTE) The Xpert Xpress SARS-CoV-2/FLU/RSV assay is intended as an aid in  the diagnosis of influenza from Nasopharyngeal swab specimens and  should not be used as a sole basis for treatment. Nasal washings and  aspirates are unacceptable for Xpert Xpress SARS-CoV-2/FLU/RSV  testing.  Fact Sheet for Patients: PinkCheek.be  Fact Sheet for Healthcare Providers: GravelBags.it  This test is not yet approved or cleared by the [place] FDA and  has been authorized for detection and/or diagnosis of SARS-CoV-2 by  FDA under an Emergency Use Authorization (EUA). This EUA will remain  in effect (meaning this test can be used) for the duration of the  Covid-19 declaration under Section 564(b)(1) of the Act, 21  U.S.C. section 360bbb-3(b)(1), unless the authorization is  terminated or revoked. Performed at [facility], [address], [place], [place] [address]          Radiology Studies: No results found.      Scheduled Meds: . amLODipine  5 mg Oral Daily  . enoxaparin (LOVENOX) injection  0.5 mg/kg Subcutaneous Q24H  . metoprolol tartrate  12.5 mg Oral BID  . risperiDONE  1 mg Oral BID  . sodium chloride flush  3 mL Intravenous Q12H  . thiamine injection   100 mg Intravenous Daily   Or  . thiamine  100 mg Oral Daily  . vitamin B-12  1,000 mcg Oral Daily   Continuous Infusions:   LOS: 9 days    Time spent: 15 minutes    Sidney Ace, MD [facility] Pager [phone]  If 7PM-7AM, please contact night-coverage 03/07/2020, 11:56 AM

## 2020-03-08 DIAGNOSIS — I10 Essential (primary) hypertension: Secondary | ICD-10-CM | POA: Diagnosis not present

## 2020-03-08 DIAGNOSIS — I480 Paroxysmal atrial fibrillation: Secondary | ICD-10-CM | POA: Diagnosis not present

## 2020-03-08 MED ORDER — OXYCODONE HCL 5 MG PO TABS: 5.0000 mg | ORAL_TABLET | Freq: Four times a day (QID) | ORAL | Status: DC | PRN

## 2020-03-08 MED ORDER — MAGIC MOUTHWASH W/LIDOCAINE: 10.0000 mL | Freq: Three times a day (TID) | ORAL | Status: DC | PRN

## 2020-03-08 MED ORDER — MAGIC MOUTHWASH W/LIDOCAINE
10.0000 mL | Freq: Four times a day (QID) | ORAL | Status: DC | PRN
  Administered 2020-03-08 (×2): 10 mL via ORAL
  Filled 2020-03-08 (×4): qty 10

## 2020-03-08 MED ORDER — AMOXICILLIN-POT CLAVULANATE 875-125 MG PO TABS
1.0000 | ORAL_TABLET | Freq: Two times a day (BID) | ORAL | Status: AC
  Administered 2020-03-08 – 2020-03-13 (×11): 1 via ORAL
  Filled 2020-03-08 (×11): qty 1

## 2020-03-08 MED ORDER — BENZOCAINE 10 % MT GEL
Freq: Four times a day (QID) | OROMUCOSAL | Status: DC | PRN
  Filled 2020-03-08 (×2): qty 9

## 2020-03-08 MED ORDER — KETOROLAC TROMETHAMINE 15 MG/ML IJ SOLN
15.0000 mg | Freq: Four times a day (QID) | INTRAMUSCULAR | Status: AC
  Administered 2020-03-08 – 2020-03-12 (×8): 15 mg via INTRAVENOUS
  Filled 2020-03-08 (×8): qty 1

## 2020-03-08 NOTE — Progress Notes (Signed)
PROGRESS NOTE    Charlotte Lane  ZOX:[id number] DOB: 10-28-1945 DOA: 02/26/2020 PCP: [facility]  Brief Narrative:  presents to the ED under IVC via EMS for evaluation of A. fib with RVR.   10/28- psych consulted for competency 10/29-calmer today.MRI was not completed yesterday due to pts behavior/refusal/ 10/30-still remains confused, even after repeating to her multiple times where she is currently she is unable to tell me she is at hospital. Per psychiatry will not change commitment paperwork for now 11/1-confused, still doesn't know where . No overnight issue. Sitter at bedside. 11/2- last night patient suddenly became agitated.  Insisted to nursing staff that she wanted to leave and go home.  Psychiatry Dr. Weber Cooks was called.  Was given Geodon IV with also Ativan with good effect 11/3: Calm this a.m.  No complaints 11/4: Patient seen and examined this morning.  Calm, no complaints.  Per case management and nursing the patient's son did visit yesterday.  Apparently this is the first time he is seen the patient in 6years.  Still pending safe disposition plan. 11/5: Patient son was contacted, indicated that home may not be a possible disposition for her.  Case management aware and looking into assisted living facilities.  11/7: Patient son was present at bedside.  I lengthy discussion with him regarding ongoing clinical course.  He stated he was working on a safe disposition plan with TOC and DSS.  Anticipate discharge to assisted living facility.   Assessment & Plan:   Principal Problem:   Dementia ([place]) Active Problems:   HTN (hypertension)   Paroxysmal atrial fibrillation with rapid ventricular response (HCC)   Altered mental status  Paroxysmal atrial fibrillation with RVR: Remains in sinus rhythm  On beta-blockers  Does require anticoagulation however due to her incompetency and confusion unable to place patient on anticoagulation at this time.   May need to readdress this as outpatient in near future when she is more stable.   Echo with normal EF   Question of altered mental status versus cognitive impairment/dementia: Brought in under IVC by EMS per patient's PCP due to concern of underlying dementia/cognitive impairment and inability to care for self per ED documentation. No family/close contact available on admission to provide further history. Per chart review, she is scheduled for apalliative care visit on 03/10/2020. CT head is negative for acute changes on admission. No evidence of infection. Possible she may have had a CVA given her A. Fib. Psychiatry was consulted to assess for competency-Per psych patient has signs of memory impairment confusion, paranoia and some psychotic symptoms in her current condition patient is not capable of making rational decision about her own care. Patient will be kept under IVC. If she becomes agitated she may need medicine for that such as antipsychotics per psychiatry -10/30-Per psychiatry since patient remains confused will likely need social work intervention ultimately. Psych does not plan to change the commitment paper for now. Toxicology negative Covid negative MRI- no recent infarction, hemorrhage or mass. Mild to moderate chronic microvascular ischemic changes. 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    Hypertension: Stable, continue with beta-blockers and amlodipine   Vitamine B12 deficiency- Started on p.o. supplementation  Consult PT/OT-recommend home health  DVT prophylaxis: Lovenox Code Status: Full Family Communication: None today Disposition Plan: Status is: Inpatient  Remains inpatient appropriate because:Unsafe d/c plan   Dispo: The patient is from: Home  Anticipated d/c is to: Unknown at this time              Anticipated d/c date is: > 3 days              Patient currently is medically stable to  d/c.   From medical standpoint patient medically stable for discharge.  Per psychiatry IVC will be lifted once we have a safe disposition plan.  Patient son was at bedside this morning.  Hopefully we should have a disposition plan sometime this week.  Consultants:   Psychiatry  Procedures:   None  Antimicrobials:   None   Subjective: Seen and examined.  Eating this morning, pleasant in no distress.  No complaints  Objective: Vitals:   03/07/20 1953 03/08/20 0512 03/08/20 0833 03/08/20 1135  BP: (!) 134/52 122/65 128/61 129/64  Pulse: 72 (!) 57 64 62  Resp: 19 18 18 20   Temp: 98.8 F (37.1 C) 98.2 F (36.8 C) 98.4 F (36.9 C) (!) 97.4 F (36.3 C)  TempSrc: Oral Oral  Oral  SpO2: 96% 97% 97% 98%  Weight:      Height:        Intake/Output Summary (Last 24 hours) at 03/08/2020 1213 Last data filed at 03/08/2020 0900 Gross per 24 hour  Intake 120 ml  Output 1 ml  Net 119 ml   Filed Weights   02/27/20 0400 02/27/20 0617 02/27/20 2300  Weight: 56.9 kg 56.9 kg 95.4 kg    Examination:  General exam: No acute distress. Respiratory system: Poor respiratory effort.  Lungs clear.  Room air. Cardiovascular system: S1-S2 heard, regular rhythm, regular rate, no murmurs gastrointestinal system: Abdomen is nondistended, soft and nontender. No organomegaly or masses felt. Normal bowel sounds heard. Central nervous system: Alert, oriented x1.  no focal neurological deficits. Extremities: Symmetric 5 x 5 power. Skin: No rashes, lesions or ulcers Psychiatry: Judgement and insight appear impaired. Mood & affect flattened.     Data Reviewed: I have personally reviewed following labs and imaging studies  CBC: Recent Labs  Lab 03/03/20 0456  WBC 9.7  HGB 13.6  HCT 40.7  MCV 89.5  PLT 951   Basic Metabolic Panel: No results for input(s): NA, K, CL, CO2, GLUCOSE, BUN, CREATININE, CALCIUM, MG, PHOS in the last 168 hours. GFR: Estimated Creatinine Clearance: 64.8 mL/min  (by C-G formula based on SCr of 0.74 mg/dL). Liver Function Tests: No results for input(s): AST, ALT, ALKPHOS, BILITOT, PROT, ALBUMIN in the last 168 hours. No results for input(s): LIPASE, AMYLASE in the last 168 hours. No results for input(s): AMMONIA in the last 168 hours. Coagulation Profile: No results for input(s): INR, PROTIME in the last 168 hours. Cardiac Enzymes: No results for input(s): CKTOTAL, CKMB, CKMBINDEX, TROPONINI in the last 168 hours. BNP (last 3 results) No results for input(s): PROBNP in the last 8760 hours. HbA1C: No results for input(s): HGBA1C in the last 72 hours. CBG: No results for input(s): GLUCAP in the last 168 hours. Lipid Profile: No results for input(s): CHOL, HDL, LDLCALC, TRIG, CHOLHDL, LDLDIRECT in the last 72 hours. Thyroid Function Tests: No results for input(s): TSH, T4TOTAL, FREET4, T3FREE, THYROIDAB in the last 72 hours. Anemia Panel: No results for input(s): VITAMINB12, FOLATE, FERRITIN, TIBC, IRON, RETICCTPCT in the last 72 hours. Sepsis Labs: No results for input(s): PROCALCITON, LATICACIDVEN in the last 168 hours.  No results found for this or any previous visit (from the past 240 hour(s)).  Radiology Studies: No results found.      Scheduled Meds: . amLODipine  5 mg Oral Daily  . enoxaparin (LOVENOX) injection  0.5 mg/kg Subcutaneous Q24H  . metoprolol tartrate  12.5 mg Oral BID  . risperiDONE  1 mg Oral BID  . sodium chloride flush  3 mL Intravenous Q12H  . thiamine injection  100 mg Intravenous Daily   Or  . thiamine  100 mg Oral Daily  . vitamin B-12  1,000 mcg Oral Daily   Continuous Infusions:   LOS: 10 days    Time spent: 15 minutes    Sidney Ace, MD [facility] Pager [phone]  If 7PM-7AM, please contact night-coverage 03/08/2020, 12:13 PM

## 2020-03-08 NOTE — Care Plan (Addendum)
Patient complaining of upper left tooth pain more on lower than top. Gum is red, root appears exposed as no gum covers her teeth. Poor dental hygiene noted. . States she has sensitive teeth. MD Sreenath notified at this time. Awaiting orders.

## 2020-03-08 NOTE — Treatment Plan (Signed)
Pt has noted swelling to left cheek at this time. Pt still complains of pain. MD to bedside at this time to eval.

## 2020-03-09 DIAGNOSIS — I10 Essential (primary) hypertension: Secondary | ICD-10-CM | POA: Diagnosis not present

## 2020-03-09 DIAGNOSIS — I480 Paroxysmal atrial fibrillation: Secondary | ICD-10-CM | POA: Diagnosis not present

## 2020-03-09 LAB — CBC WITH DIFFERENTIAL/PLATELET
Abs Immature Granulocytes: 0.06 10*3/uL (ref 0.00–0.07)
Basophils Absolute: 0.1 10*3/uL (ref 0.0–0.1)
Basophils Relative: 1 %
Eosinophils Absolute: 0.2 10*3/uL (ref 0.0–0.5)
Eosinophils Relative: 2 %
HCT: 40.3 % (ref 36.0–46.0)
Hemoglobin: 13.1 g/dL (ref 12.0–15.0)
Immature Granulocytes: 1 %
Lymphocytes Relative: 31 %
Lymphs Abs: 2.5 10*3/uL (ref 0.7–4.0)
MCH: 29.4 pg (ref 26.0–34.0)
MCHC: 32.5 g/dL (ref 30.0–36.0)
MCV: 90.4 fL (ref 80.0–100.0)
Monocytes Absolute: 0.8 10*3/uL (ref 0.1–1.0)
Monocytes Relative: 10 %
Neutro Abs: 4.5 10*3/uL (ref 1.7–7.7)
Neutrophils Relative %: 55 %
Platelets: 291 10*3/uL (ref 150–400)
RBC: 4.46 MIL/uL (ref 3.87–5.11)
RDW: 13.8 % (ref 11.5–15.5)
WBC: 8.1 10*3/uL (ref 4.0–10.5)
nRBC: 0 % (ref 0.0–0.2)

## 2020-03-09 LAB — BASIC METABOLIC PANEL
Anion gap: 8 (ref 5–15)
BUN: 20 mg/dL (ref 8–23)
CO2: 27 mmol/L (ref 22–32)
Calcium: 9.5 mg/dL (ref 8.9–10.3)
Chloride: 104 mmol/L (ref 98–111)
Creatinine, Ser: 1.13 mg/dL — ABNORMAL HIGH (ref 0.44–1.00)
GFR, Estimated: 51 mL/min — ABNORMAL LOW (ref >60)
Glucose, Bld: 97 mg/dL (ref 70–99)
Potassium: 3.9 mmol/L (ref 3.5–5.1)
Sodium: 139 mmol/L (ref 135–145)

## 2020-03-09 LAB — VITAMIN B1: Vitamin B1 (Thiamine): 101.8 nmol/L (ref 66.5–200.0)

## 2020-03-09 NOTE — Care Management Important Message (Signed)
Important Message  Patient Details  Name: Charlotte Lane MRN: [id number] Date of Birth: November 13, 1945   Medicare Important Message Given:  Yes     [name] 03/09/2020, 1:34 PM

## 2020-03-09 NOTE — Progress Notes (Signed)
Physical Therapy Treatment Patient Details Name: Charlotte Lane MRN: [id number] DOB: 1945/08/05 Today's Date: 03/09/2020    History of Present Illness Charlotte Lane is a 74 y.o. female with PMH significant for HTN, new onset Afib with RVR who was brought in under Involuntary commitment. She had a wellness check, was found to be in Afib. She refused coming to the ED by a physician and was brought in under IVC.    PT Comments    Pt is able to increase ambulation distances (up to 800 ft) without AD without any LOB. Pt also performed balance retraining: with SLS, tandem stance, & retrograde gait (20 ft x 4 with CGA). Attempted to have pt perform grapevine ambulation but unable to coordinate/perform 2/2 cognitive deficits. Pt performs 10x sit<>stand without BUE support for BLE strengthening. Will continue to follow pt acutely to address high level balance deficits.     Follow Up Recommendations  Home health PT     Equipment Recommendations  None recommended by PT    Recommendations for Other Services       Precautions / Restrictions Precautions Precautions: None Restrictions Weight Bearing Restrictions: No    Mobility  Bed Mobility Overal bed mobility: Independent                Transfers Overall transfer level: Independent   Transfers: Sit to/from Stand Sit to Stand: Independent            Ambulation/Gait Ambulation/Gait assistance: Supervision Gait Distance (Feet): 800 Feet Assistive device: None Gait Pattern/deviations: Step-through pattern     General Gait Details: no LOB   Stairs             Wheelchair Mobility    Modified Rankin (Stroke Patients Only)       Balance Overall balance assessment: Independent           Standing balance-Leahy Scale: Good                              Cognition Arousal/Alertness: Awake/alert Behavior During Therapy: WFL for tasks assessed/performed Overall Cognitive Status: No  family/caregiver present to determine baseline cognitive functioning                                 General Comments: Oriented to self, hospital & year but unable to report current month or name of facility      Exercises      General Comments        Pertinent Vitals/Pain Pain Assessment: No/denies pain    Home Living                      Prior Function            PT Goals (current goals can now be found in the care plan section) Acute Rehab PT Goals Patient Stated Goal: To return home Progress towards PT goals: Progressing toward goals    Frequency    Min 2X/week      PT Plan Current plan remains appropriate    Co-evaluation              AM-PAC PT "6 Clicks" Mobility   Outcome Measure  Help needed turning from your back to your side while in a flat bed without using bedrails?: None Help needed moving from lying on your back to sitting on the side of  a flat bed without using bedrails?: None Help needed moving to and from a bed to a chair (including a wheelchair)?: None Help needed standing up from a chair using your arms (e.g., wheelchair or bedside chair)?: None Help needed to walk in hospital room?: None Help needed climbing 3-5 steps with a railing? : A Little 6 Click Score: 23    End of Session Equipment Utilized During Treatment: Gait belt Activity Tolerance: Patient tolerated treatment well Patient left: in bed;with nursing/sitter in room;with call bell/phone within reach Nurse Communication: Mobility status PT Visit Diagnosis: Unsteadiness on feet (R26.81);Other abnormalities of gait and mobility (R26.89);Muscle weakness (generalized) (M62.81);Difficulty in walking, not elsewhere classified (R26.2)     Time: 1007-1219 PT Time Calculation (min) (ACUTE ONLY): 24 min  Charges:  $Therapeutic Activity: 23-37 mins                     Lavone Nian, PT, DPT 03/09/20, 12:00 PM    Waunita Schooner 03/09/2020, 11:57  AM

## 2020-03-09 NOTE — TOC Progression Note (Addendum)
Transition of Care [facility]) - Progression Note    Patient Details  Name: Charlotte Lane MRN: [id number] Date of Birth: 05/12/1945  Transition of Care [facility]) CM/SW Olivia Lopez de Gutierrez, LCSW Phone Number: 03/09/2020, 9:32 AM  Clinical Narrative:   Damaris Schooner to [facility] at [place]. She confirmed referral was received on Friday. She is going to review again to make sure they have everything they need and will schedule a facetime/zoom assessment.  2:13 pm: Left message for Colletta [place] to check status of referral decision. Received call from patient's son. He is meeting with guardianship attorney, Kristine Royal. CSW provided information on patient's orientation status and the APS report that ED CSW made on 10/28.  Expected Discharge Plan: [place] Barriers to Discharge: Continued Medical Work up, Other (comment) (Patient does not have capacity)  Expected Discharge Plan and Services Expected Discharge Plan: [place] In-house Referral: Clinical Social Work     Living arrangements for the past 2 months: Single Family Home                                       Social Determinants of Health (SDOH) Interventions    Readmission Risk Interventions No flowsheet data found.

## 2020-03-09 NOTE — Progress Notes (Signed)
OT Cancellation Note  Patient Details Name: Charlotte Lane MRN: [id number] DOB: 08-27-1945   Cancelled Treatment:    Reason Eval/Treat Not Completed: Other (comment). Pt sleeping upon attempt, sitter in room. Pt wakes easily to therapist's voice. Agreeable to OT tx this afternoon for bathing tasks, but fatigued from recent PT session right now. Will re-attempt after lunch today.   Jeni Salles, MPH, MS, OTR/L ascom [phone] 03/09/20, 11:23 AM

## 2020-03-09 NOTE — Progress Notes (Signed)
PROGRESS NOTE    Charlotte Lane  WNI:[id number] DOB: June 24, 1945 DOA: 02/26/2020 PCP: [facility]  Brief Narrative:  presents to the ED under IVC via EMS for evaluation of A. fib with RVR.   10/28- psych consulted for competency 10/29-calmer today.MRI was not completed yesterday due to pts behavior/refusal/ 10/30-still remains confused, even after repeating to her multiple times where she is currently she is unable to tell me she is at hospital. Per psychiatry will not change commitment paperwork for now 11/1-confused, still doesn't know where . No overnight issue. Sitter at bedside. 11/2- last night patient suddenly became agitated.  Insisted to nursing staff that she wanted to leave and go home.  Psychiatry Dr. Weber Cooks was called.  Was given Geodon IV with also Ativan with good effect 11/3: Calm this a.m.  No complaints 11/4: Patient seen and examined this morning.  Calm, no complaints.  Per case management and nursing the patient's son did visit yesterday.  Apparently this is the first time he is seen the patient in 6years.  Still pending safe disposition plan. 11/5: Patient son was contacted, indicated that home may not be a possible disposition for her.  Case management aware and looking into assisted living facilities.  11/7: Patient son was present at bedside.  I lengthy discussion with him regarding ongoing clinical course.  He stated he was working on a safe disposition plan with TOC and DSS.  Anticipate discharge to assisted living facility.  11/8: Patient complained of some dental pain yesterday.  I evaluated bedside.  Patient has poor dentition with numerous dental caries.  P.o. Augmentin and as needed pain control prescribed.  Symptoms improved.   Assessment & Plan:   Principal Problem:   Dementia ([place]) Active Problems:   HTN (hypertension)   Paroxysmal atrial fibrillation with rapid ventricular response (HCC)   Altered mental status  Dental  caries/odontogenic infection Patient with poor dentition Complained of some tooth pain P.o. Augmentin initiated with good result Plan: Continue Augmentin, plan for 7-day course As needed pain control Recommend outpatient evaluation by dentist after discharge    Paroxysmal atrial fibrillation with RVR: Remains in sinus rhythm  On beta-blockers  Does require anticoagulation however due to her incompetency and confusion unable to place patient on anticoagulation at this time.  May need to readdress this as outpatient in near future when she is more stable.   Echo with normal EF   Question of altered mental status versus cognitive impairment/dementia: Brought in under IVC by EMS per patient's PCP due to concern of underlying dementia/cognitive impairment and inability to care for self per ED documentation. No family/close contact available on admission to provide further history. Per chart review, she is scheduled for apalliative care visit on 03/10/2020. CT head is negative for acute changes on admission. No evidence of infection. Possible she may have had a CVA given her A. Fib. Psychiatry was consulted to assess for competency-Per psych patient has signs of memory impairment confusion, paranoia and some psychotic symptoms in her current condition patient is not capable of making rational decision about her own care. Patient will be kept under IVC. If she becomes agitated she may need medicine for that such as antipsychotics per psychiatry -10/30-Per psychiatry since patient remains confused will likely need social work intervention ultimately. Psych does not plan to change the commitment paper for now. Toxicology negative Covid negative MRI- no recent infarction, hemorrhage or mass. Mild to moderate chronic microvascular ischemic changes. Chronic blood  products in the region of left anterior perforated substance?    Hypertension: Stable, continue with beta-blockers and  amlodipine   Vitamine B12 deficiency- Started on p.o. supplementation  Consult PT/OT-recommend home health  DVT prophylaxis: Lovenox Code Status: Full Family Communication: None today Disposition Plan: Status is: Inpatient  Remains inpatient appropriate because:Unsafe d/c plan   Dispo: The patient is from: Home              Anticipated d/c is to: Unknown at this time              Anticipated d/c date is: > 3 days              Patient currently is medically stable to d/c.   From medical standpoint patient medically stable for discharge.  IVC lifted by psychiatry as patient is no longer a threat to herself.  Consultants:   Psychiatry  Procedures:   None  Antimicrobials:   None   Subjective: Seen and examined.  Improvement in dental pain over interval  Objective: Vitals:   03/08/20 2359 03/09/20 0430 03/09/20 0758 03/09/20 1123  BP: (!) 115/52 (!) 117/52 120/64 109/65  Pulse: (!) 59 (!) 54 (!) 57 (!) 57  Resp: 15 15 20 20   Temp: 97.7 F (36.5 C) 98.2 F (36.8 C) (!) 97.5 F (36.4 C) 97.8 F (36.6 C)  TempSrc: Oral Oral Oral   SpO2: 99% 98% 97% 97%  Weight:      Height:        Intake/Output Summary (Last 24 hours) at 03/09/2020 1220 Last data filed at 03/09/2020 0847 Gross per 24 hour  Intake 1040 ml  Output 1 ml  Net 1039 ml   Filed Weights   02/27/20 0400 02/27/20 0617 02/27/20 2300  Weight: 56.9 kg 56.9 kg 95.4 kg    Examination:  General exam: No acute distress. Respiratory system: Poor respiratory effort.  Lungs clear.  Room air. Cardiovascular system: S1-S2 heard, regular rhythm, regular rate, no murmurs gastrointestinal system: Abdomen is nondistended, soft and nontender. No organomegaly or masses felt. Normal bowel sounds heard. Central nervous system: Alert, oriented x1.  no focal neurological deficits. Extremities: Symmetric 5 x 5 power. Skin: No rashes, lesions or ulcers Psychiatry: Judgement and insight appear impaired. Mood & affect  flattened.     Data Reviewed: I have personally reviewed following labs and imaging studies  CBC: Recent Labs  Lab 03/03/20 0456 03/09/20 0427  WBC 9.7 8.1  NEUTROABS  --  4.5  HGB 13.6 13.1  HCT 40.7 40.3  MCV 89.5 90.4  PLT 235 694   Basic Metabolic Panel: Recent Labs  Lab 03/09/20 0427  NA 139  K 3.9  CL 104  CO2 27  GLUCOSE 97  BUN 20  CREATININE 1.13*  CALCIUM 9.5   GFR: Estimated Creatinine Clearance: 45.8 mL/min (A) (by C-G formula based on SCr of 1.13 mg/dL (H)). Liver Function Tests: No results for input(s): AST, ALT, ALKPHOS, BILITOT, PROT, ALBUMIN in the last 168 hours. No results for input(s): LIPASE, AMYLASE in the last 168 hours. No results for input(s): AMMONIA in the last 168 hours. Coagulation Profile: No results for input(s): INR, PROTIME in the last 168 hours. Cardiac Enzymes: No results for input(s): CKTOTAL, CKMB, CKMBINDEX, TROPONINI in the last 168 hours. BNP (last 3 results) No results for input(s): PROBNP in the last 8760 hours. HbA1C: No results for input(s): HGBA1C in the last 72 hours. CBG: No results for input(s): GLUCAP in the last 168  hours. Lipid Profile: No results for input(s): CHOL, HDL, LDLCALC, TRIG, CHOLHDL, LDLDIRECT in the last 72 hours. Thyroid Function Tests: No results for input(s): TSH, T4TOTAL, FREET4, T3FREE, THYROIDAB in the last 72 hours. Anemia Panel: No results for input(s): VITAMINB12, FOLATE, FERRITIN, TIBC, IRON, RETICCTPCT in the last 72 hours. Sepsis Labs: No results for input(s): PROCALCITON, LATICACIDVEN in the last 168 hours.  No results found for this or any previous visit (from the past 240 hour(s)).       Radiology Studies: No results found.      Scheduled Meds: . amLODipine  5 mg Oral Daily  . amoxicillin-clavulanate  1 tablet Oral Q12H  . enoxaparin (LOVENOX) injection  0.5 mg/kg Subcutaneous Q24H  . ketorolac  15 mg Intravenous Q6H  . metoprolol tartrate  12.5 mg Oral BID  .  risperiDONE  1 mg Oral BID  . sodium chloride flush  3 mL Intravenous Q12H  . thiamine injection  100 mg Intravenous Daily   Or  . thiamine  100 mg Oral Daily  . vitamin B-12  1,000 mcg Oral Daily   Continuous Infusions:   LOS: 11 days    Time spent: 15 minutes    Sidney Ace, MD [facility] Pager [phone]  If 7PM-7AM, please contact night-coverage 03/09/2020, 12:20 PM

## 2020-03-09 NOTE — Progress Notes (Signed)
Occupational Therapy Treatment Patient Details Name: Charlotte Lane MRN: [id number] DOB: 06-19-1945 Today's Date: 03/09/2020    History of present illness Charlotte Lane is a 74 y.o. female with PMH significant for HTN, new onset Afib with RVR who was brought in under Involuntary commitment. She had a wellness check, was found to be in Afib. She refused coming to the ED by a physician and was brought in under IVC.   OT comments  Pt seen for OT tx this date. Pt alert, agreeable to session and eager to take a shower. Pt ambulated with supervision and no AD to bathroom. Set up and occasional cues for safety while pt performed standing shower in bathroom. Able to wash hair with eyes closed without LOB. Pt able to stand on 1 foot while leaning lightly against wall to wash/dry foot and doff/don socks. SBA for safety. Pt at times perseverating on whether "they" were going to come back (presume CNA who was her sitter earlier) and whether she would get to take a shower today since she wasn't sure it if was "too cold" out. Pt denied fatigue or difficulty performing bathing/dressing tasks. Stood at sink to [employer] hair with no UE support needed, SBA for safety. Pt continues to benefit from skilled OT services. Current recommendations remain appropriate.    Follow Up Recommendations  Home health OT;Supervision - Intermittent    Equipment Recommendations  None recommended by OT    Recommendations for Other Services      Precautions / Restrictions Precautions Precautions: Fall Precaution Comments: low fall risk Restrictions Weight Bearing Restrictions: No       Mobility Bed Mobility Overal bed mobility: Independent                Transfers Overall transfer level: Needs assistance Equipment used: None Transfers: Sit to/from Stand Sit to Stand: Supervision         General transfer comment: supervision for safety    Balance Overall balance assessment: Mild deficits observed, not  formally tested           Standing balance-Leahy Scale: Good                             ADL either performed or assessed with clinical judgement   ADL Overall ADL's : Modified independent                                       General ADL Comments: Supervision for standing shower in shower stall, set up for soap and wash cloths. supervisoin for safety. SBA for standing LB drying and dressing with pt leaning lightly against wall and using UE support on bar while using other to don sock in standing.     Vision       Perception     Praxis      Cognition Arousal/Alertness: Awake/alert Behavior During Therapy: WFL for tasks assessed/performed Overall Cognitive Status: No family/caregiver present to determine baseline cognitive functioning                                 General Comments: oriented to self, place, year; follows commands well        Exercises Other Exercises Other Exercises: Pt performed standing shower with supervision for safety/balance; able to balance on 1 foot  to wash/dry/don sock with SBA for safety/balance; set up and occasional cues for safety   Shoulder Instructions       General Comments      Pertinent Vitals/ Pain       Pain Assessment: No/denies pain  Home Living                                          Prior Functioning/Environment              Frequency  Min 1X/week        Progress Toward Goals  OT Goals(current goals can now be found in the care plan section)  Progress towards OT goals: Progressing toward goals  Acute Rehab OT Goals Patient Stated Goal: To return home OT Goal Formulation: With patient Time For Goal Achievement: 03/16/20 Potential to Achieve Goals: Good  Plan Frequency remains appropriate;Discharge plan remains appropriate    Co-evaluation                 AM-PAC OT "6 Clicks" Daily Activity     Outcome Measure   Help from another  person eating meals?: None Help from another person taking care of personal grooming?: None Help from another person toileting, which includes using toliet, bedpan, or urinal?: None Help from another person bathing (including washing, rinsing, drying)?: A Little Help from another person to put on and taking off regular upper body clothing?: None Help from another person to put on and taking off regular lower body clothing?: A Little 6 Click Score: 22    End of Session    OT Visit Diagnosis: Other abnormalities of gait and mobility (R26.89)   Activity Tolerance Patient tolerated treatment well   Patient Left in bed;with call bell/phone within reach;with bed alarm set   Nurse Communication Other (comment) (shower completed, back to bed)        Time: 8101-7510 OT Time Calculation (min): 24 min  Charges: OT General Charges $OT Visit: 1 Visit OT Treatments $Self Care/Home Management : 23-37 mins  Jeni Salles, MPH, MS, OTR/L ascom [phone] 03/09/20, 1:54 PM

## 2020-03-10 ENCOUNTER — Encounter: Payer: Self-pay | Admitting: Nurse Practitioner

## 2020-03-10 ENCOUNTER — Other Ambulatory Visit: Payer: Medicare Other | Admitting: Nurse Practitioner

## 2020-03-10 DIAGNOSIS — I480 Paroxysmal atrial fibrillation: Secondary | ICD-10-CM | POA: Diagnosis not present

## 2020-03-10 DIAGNOSIS — I10 Essential (primary) hypertension: Secondary | ICD-10-CM | POA: Diagnosis not present

## 2020-03-10 DIAGNOSIS — Z515 Encounter for palliative care: Secondary | ICD-10-CM

## 2020-03-10 NOTE — Plan of Care (Signed)
  Problem: Clinical Measurements: Goal: Will remain free from infection Outcome: Progressing Goal: Diagnostic test results will improve Outcome: Progressing   Problem: Nutrition: Goal: Adequate nutrition will be maintained Outcome: Progressing  Pt educated about dental hygiene and caring for her teeth. This seems to be her active issue as others have resolved. Today, after eating, pt has maintained oral hygiene, brushing her teeth after meals, and educated on importance of follow up. She was able to have discussion with Probation officer and states she will talk to her son about getting her into a dentist.

## 2020-03-10 NOTE — Progress Notes (Signed)
Occupational Therapy Treatment Patient Details Name: Charlotte Lane MRN: [id number] DOB: April 29, 1946 Today's Date: 03/10/2020    History of present illness Charlotte Lane is a 74 y.o. female with PMH significant for HTN, new onset Afib with RVR who was brought in under Involuntary commitment. She had a wellness check, was found to be in Afib. She refused coming to the ED by a physician and was brought in under IVC.   OT comments  Pt seen for OT tx this date. Situational safety activity: pt performed well, providing safe and appropriate responses to various situational safety scenarios. Does demonstrate slightly impaired insight into safety and tends to perseverate a bit but easily redirected with verbal cues. Pt may do well in ALF setting (per CM this is being set up) to maximize safety and maintain ADL independence. Additionally, pt would have increased access to social interactions for improved social/emotional health and wellbeing. Continue to recommend [place] (at ALF setting) to maximize pt's safety with independent living skills as appropriate and minimize risk of falls, functional/cognitive decline, and need for increased caregiver burden.    Follow Up Recommendations  Supervision - Intermittent;Home health OT;Other (comment) (ALF setting)    Equipment Recommendations  None recommended by OT    Recommendations for Other Services      Precautions / Restrictions Precautions Precautions: Fall Precaution Comments: low fall risk Restrictions Weight Bearing Restrictions: No       Mobility Bed Mobility                  Transfers                      Balance                                           ADL either performed or assessed with clinical judgement   ADL                                               Vision       Perception     Praxis      Cognition Arousal/Alertness: Awake/alert Behavior During Therapy:  WFL for tasks assessed/performed Overall Cognitive Status: No family/caregiver present to determine baseline cognitive functioning                                 General Comments: oriented to self, year, place (hospital, not name)        Exercises Other Exercises Other Exercises: Situational safety activity: pt performed well, providing safe and appropriate responses to various situational safety scenarios. Does demonstrate slightly impaired insight into safety and tends to perseverate a bit but easily redirected with verbal cues   Shoulder Instructions       General Comments      Pertinent Vitals/ Pain       Pain Assessment: No/denies pain  Home Living                                          Prior Functioning/Environment  Frequency  Min 1X/week        Progress Toward Goals  OT Goals(current goals can now be found in the care plan section)  Progress towards OT goals: Progressing toward goals  Acute Rehab OT Goals Patient Stated Goal: To return home OT Goal Formulation: With patient Time For Goal Achievement: 03/16/20 Potential to Achieve Goals: Good  Plan Frequency remains appropriate;Discharge plan remains appropriate    Co-evaluation                 AM-PAC OT "6 Clicks" Daily Activity     Outcome Measure   Help from another person eating meals?: None Help from another person taking care of personal grooming?: None Help from another person toileting, which includes using toliet, bedpan, or urinal?: None Help from another person bathing (including washing, rinsing, drying)?: A Little Help from another person to put on and taking off regular upper body clothing?: None Help from another person to put on and taking off regular lower body clothing?: A Little 6 Click Score: 22    End of Session    OT Visit Diagnosis: Other abnormalities of gait and mobility (R26.89)   Activity Tolerance Patient  tolerated treatment well   Patient Left in bed;with call bell/phone within reach   Nurse Communication          Time: 6861-6837 OT Time Calculation (min): 26 min  Charges: OT General Charges $OT Visit: 1 Visit OT Treatments $Self Care/Home Management : 23-37 mins  Jeni Salles, MPH, MS, OTR/L ascom [phone] 03/10/20, 4:48 PM

## 2020-03-10 NOTE — NC FL2 (Signed)
[place] LEVEL OF CARE SCREENING TOOL     IDENTIFICATION  Patient Name: Charlotte Lane Birthdate: 09-Nov-1945 Sex: female Admission Date (Current Location): 02/26/2020  [place] and [place] Number:  [name] (Son lives in [place])   Facility and Address:  [facility], [address] [place], [place] [address]      Provider Number: [id number]  Attending Physician Name and Address:  Sidney Ace, MD  Relative Name and Phone Number:       Current Level of Care: Hospital Recommended Level of Care: Memory Care Prior Approval Number:    Date Approved/Denied:   PASRR Number:    Discharge Plan: Other (Comment) (Memory Care)    Current Diagnoses: Patient Active Problem List   Diagnosis Date Noted  . Dementia ([place]) 02/27/2020  . Paroxysmal atrial fibrillation with rapid ventricular response ([place]) 02/26/2020  . Altered mental status 02/26/2020  . Sebaceous cyst   . HTN (hypertension) 06/25/2015  . Chest pain with moderate risk for cardiac etiology 06/25/2015  . Chest pain 01/23/2013    Orientation RESPIRATION BLADDER Height & Weight     Self, Time, Place  Normal Continent Weight: 210 lb 5.1 oz (95.4 kg) Height:  5' (152.4 cm)  BEHAVIORAL SYMPTOMS/MOOD NEUROLOGICAL BOWEL NUTRITION STATUS   (None)  (Dementia) Continent Diet (Heart healthy)  AMBULATORY STATUS COMMUNICATION OF NEEDS Skin   Supervision Verbally Normal                       Personal Care Assistance Level of Assistance  Bathing, Feeding, Dressing Bathing Assistance: Independent (Modified) Feeding assistance: Independent (Modified) Dressing Assistance: Independent (Modified)     Functional Limitations Info  Sight, Hearing, Speech Sight Info: Adequate Hearing Info: Adequate Speech Info: Adequate    SPECIAL CARE FACTORS FREQUENCY                       Contractures Contractures Info: Not present    Additional Factors Info  Code  Status, Allergies Code Status Info: Full code Allergies Info: NKDA           Current Medications (03/10/2020):  This is the current hospital active medication list Current Facility-Administered Medications  Medication Dose Route Frequency Provider Last Rate Last Admin  . acetaminophen (TYLENOL) tablet 650 mg  650 mg Oral Q6H PRN Lenore Cordia, MD   650 mg at 03/08/20 1406   Or  . acetaminophen (TYLENOL) suppository 650 mg  650 mg Rectal Q6H PRN Zada Finders R, MD      . amLODipine (NORVASC) tablet 5 mg  5 mg Oral Daily Fritzi Mandes, MD   5 mg at 03/10/20 0818  . amoxicillin-clavulanate (AUGMENTIN) 875-125 MG per tablet 1 tablet  1 tablet Oral Q12H Ralene Muskrat B, MD   1 tablet at 03/10/20 0818  . benzocaine (ORAJEL) 10 % mucosal gel   Mouth/Throat QID PRN Sreenath, Sudheer B, MD      . enoxaparin (LOVENOX) injection 47.5 mg  0.5 mg/kg Subcutaneous Q24H Rocky Morel, RPH   47.5 mg at 03/09/20 2103  . ketorolac (TORADOL) 15 MG/ML injection 15 mg  15 mg Intravenous Q6H Sreenath, Sudheer B, MD   15 mg at 03/10/20 0518  . magic mouthwash w/lidocaine  10 mL Oral QID PRN Ralene Muskrat B, MD   10 mL at 03/08/20 2104  . metoprolol tartrate (LOPRESSOR) tablet 12.5 mg  12.5 mg Oral BID Lenore Cordia, MD  12.5 mg at 03/10/20 0818  . oxyCODONE (Oxy IR/ROXICODONE) immediate release tablet 5 mg  5 mg Oral Q6H PRN Sreenath, Sudheer B, MD      . risperiDONE (RISPERDAL M-TABS) disintegrating tablet 1 mg  1 mg Oral BID Clapacs, Madie Reno, MD   1 mg at 03/10/20 0819  . sodium chloride flush (NS) 0.9 % injection 3 mL  3 mL Intravenous Q12H Zada Finders R, MD   3 mL at 03/10/20 0820  . thiamine (B-1) injection 100 mg  100 mg Intravenous Daily Donnetta Simpers, MD       Or  . thiamine tablet 100 mg  100 mg Oral Daily Donnetta Simpers, MD   100 mg at 03/10/20 0819  . vitamin B-12 (CYANOCOBALAMIN) tablet 1,000 mcg  1,000 mcg Oral Daily Donnetta Simpers, MD   1,000 mcg at 03/10/20 0818      Discharge Medications: Please see discharge summary for a list of discharge medications.  Relevant Imaging Results:  Relevant Lab Results:   Additional Information SS#: [id number]  Candie Chroman, LCSW

## 2020-03-10 NOTE — Progress Notes (Signed)
Error, did not see; was in hospital

## 2020-03-10 NOTE — Plan of Care (Signed)
Pt confused, disoriented to situation. Pt walked in the hallway at beginning of the shift, then sat on a chair in her room. Pt calm and cooperative. On RA. Takes pills whole. Denies pain. Vitals stable. Plan is to be discharge to assisted living facility. Safety measures in place. Will continue to monitor.   Problem: Education: Goal: Knowledge of General Education information will improve Description: Including pain rating scale, medication(s)/side effects and non-pharmacologic comfort measures Outcome: Progressing   Problem: Health Behavior/Discharge Planning: Goal: Ability to manage health-related needs will improve Outcome: Progressing   Problem: Clinical Measurements: Goal: Ability to maintain clinical measurements within normal limits will improve Outcome: Progressing Goal: Will remain free from infection Outcome: Progressing Goal: Diagnostic test results will improve Outcome: Progressing Goal: Respiratory complications will improve Outcome: Progressing Goal: Cardiovascular complication will be avoided Outcome: Progressing   Problem: Activity: Goal: Risk for activity intolerance will decrease Outcome: Progressing   Problem: Nutrition: Goal: Adequate nutrition will be maintained Outcome: Progressing   Problem: Coping: Goal: Level of anxiety will decrease Outcome: Progressing   Problem: Elimination: Goal: Will not experience complications related to bowel motility Outcome: Progressing Goal: Will not experience complications related to urinary retention Outcome: Progressing   Problem: Pain Managment: Goal: General experience of comfort will improve Outcome: Progressing   Problem: Safety: Goal: Ability to remain free from injury will improve Outcome: Progressing   Problem: Skin Integrity: Goal: Risk for impaired skin integrity will decrease Outcome: Progressing

## 2020-03-10 NOTE — Treatment Plan (Signed)
Md Clapacs states to not renew the IVC and to see how patient does.

## 2020-03-10 NOTE — Progress Notes (Signed)
PROGRESS NOTE    Charlotte Lane  IWO:[id number] DOB: 03-Jul-1945 DOA: 02/26/2020 PCP: [facility]  Brief Narrative:  presents to the ED under IVC via EMS for evaluation of A. fib with RVR.   10/28- psych consulted for competency 10/29-calmer today.MRI was not completed yesterday due to pts behavior/refusal/ 10/30-still remains confused, even after repeating to her multiple times where she is currently she is unable to tell me she is at hospital. Per psychiatry will not change commitment paperwork for now 11/1-confused, still doesn't know where . No overnight issue. Sitter at bedside. 11/2- last night patient suddenly became agitated.  Insisted to nursing staff that she wanted to leave and go home.  Psychiatry Dr. Weber Cooks was called.  Was given Geodon IV with also Ativan with good effect  11/3-11/9: Patient is overall calm and not agitated throughout hospital stay.  We are working to find appropriate disposition plan.  The patient's son who with the patient has not seen in 6 years has agreed to help with disposition.  Unfortunately home was not appropriate.  TOC working on assisted living facility.  Patient does not have capacity.  She is no longer danger to herself.  IVC has been rescinded.   Assessment & Plan:   Principal Problem:   Dementia ([place]) Active Problems:   HTN (hypertension)   Paroxysmal atrial fibrillation with rapid ventricular response (HCC)   Altered mental status  Dental caries/odontogenic infection Patient with poor dentition Complained of some tooth pain P.o. Augmentin initiated with good result Plan: Continue Augmentin, plan for 7-day course (day 3/7) As needed pain control Recommend outpatient evaluation by dentist after discharge  Paroxysmal atrial fibrillation with RVR: Remains in sinus rhythm  On beta-blockers  Does require anticoagulation however due to her incompetency and confusion unable to place patient on  anticoagulation at this time.  May need to readdress this as outpatient in near future when she is more stable.   Echo with normal EF   Question of altered mental status versus cognitive impairment/dementia: Brought in under IVC by EMS per patient's PCP due to concern of underlying dementia/cognitive impairment and inability to care for self per ED documentation. No family/close contact available on admission to provide further history. Per chart review, she is scheduled for apalliative care visit on 03/10/2020. CT head is negative for acute changes on admission. No evidence of infection. Possible she may have had a CVA given her A. Fib. Psychiatry was consulted to assess for competency-Per psych patient has signs of memory impairment confusion, paranoia and some psychotic symptoms in her current condition patient is not capable of making rational decision about her own care. Patient will be kept under IVC. If she becomes agitated she may need medicine for that such as antipsychotics per psychiatry -10/30-Per psychiatry since patient remains confused will likely need social work intervention ultimately. Psych does not plan to change the commitment paper for now. Toxicology negative Covid negative MRI- no recent infarction, hemorrhage or mass. Mild to moderate chronic microvascular ischemic changes. 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    Hypertension: Stable, continue with beta-blockers and amlodipine   Vitamine B12 deficiency- Started on p.o. supplementation  Consult PT/OT-recommend home health  DVT prophylaxis: Lovenox Code Status: Full Family Communication: None today Disposition Plan: Status is: Inpatient  Remains inpatient appropriate because:Unsafe d/c plan   Dispo: The patient is from: Home  Anticipated d/c is to: Unknown at this time              Anticipated d/c date is: > 3 days              Patient currently is  medically stable to d/c.   Patient medically clear for discharge.  IVC has been lifted.  We are working towards a safe disposition plan.  Consultants:   Psychiatry  Procedures:   None  Antimicrobials:   None   Subjective: Seen and examined.  Improvement in dental pain over interval  Objective: Vitals:   03/10/20 0011 03/10/20 0440 03/10/20 0817 03/10/20 1113  BP: (!) 136/59 (!) 97/54 (!) 116/54 (!) 110/56  Pulse: 60 (!) 53 60 (!) 55  Resp: 18 16 20 16   Temp: 98.2 F (36.8 C) 98.1 F (36.7 C) (!) 97.5 F (36.4 C) 98.4 F (36.9 C)  TempSrc: Oral Oral Oral Oral  SpO2: 100% 98% 98% 99%  Weight:      Height:        Intake/Output Summary (Last 24 hours) at 03/10/2020 1310 Last data filed at 03/10/2020 1200 Gross per 24 hour  Intake 240 ml  Output 0 ml  Net 240 ml   Filed Weights   02/27/20 0400 02/27/20 0617 02/27/20 2300  Weight: 56.9 kg 56.9 kg 95.4 kg    Examination:  General exam: No acute distress. Respiratory system: Poor respiratory effort.  Lungs clear.  Room air. Cardiovascular system: S1-S2 heard, regular rhythm, regular rate, no murmurs gastrointestinal system: Abdomen is nondistended, soft and nontender. No organomegaly or masses felt. Normal bowel sounds heard. Central nervous system: Alert, oriented x1.  no focal neurological deficits. Extremities: Symmetric 5 x 5 power. Skin: No rashes, lesions or ulcers Psychiatry: Judgement and insight appear impaired. Mood & affect flattened.     Data Reviewed: I have personally reviewed following labs and imaging studies  CBC: Recent Labs  Lab 03/09/20 0427  WBC 8.1  NEUTROABS 4.5  HGB 13.1  HCT 40.3  MCV 90.4  PLT 751   Basic Metabolic Panel: Recent Labs  Lab 03/09/20 0427  NA 139  K 3.9  CL 104  CO2 27  GLUCOSE 97  BUN 20  CREATININE 1.13*  CALCIUM 9.5   GFR: Estimated Creatinine Clearance: 45.8 mL/min (A) (by C-G formula based on SCr of 1.13 mg/dL (H)). Liver Function Tests: No  results for input(s): AST, ALT, ALKPHOS, BILITOT, PROT, ALBUMIN in the last 168 hours. No results for input(s): LIPASE, AMYLASE in the last 168 hours. No results for input(s): AMMONIA in the last 168 hours. Coagulation Profile: No results for input(s): INR, PROTIME in the last 168 hours. Cardiac Enzymes: No results for input(s): CKTOTAL, CKMB, CKMBINDEX, TROPONINI in the last 168 hours. BNP (last 3 results) No results for input(s): PROBNP in the last 8760 hours. HbA1C: No results for input(s): HGBA1C in the last 72 hours. CBG: No results for input(s): GLUCAP in the last 168 hours. Lipid Profile: No results for input(s): CHOL, HDL, LDLCALC, TRIG, CHOLHDL, LDLDIRECT in the last 72 hours. Thyroid Function Tests: No results for input(s): TSH, T4TOTAL, FREET4, T3FREE, THYROIDAB in the last 72 hours. Anemia Panel: No results for input(s): VITAMINB12, FOLATE, FERRITIN, TIBC, IRON, RETICCTPCT in the last 72 hours. Sepsis Labs: No results for input(s): PROCALCITON, LATICACIDVEN in the last 168 hours.  No results found for this or any previous visit (from the past 240 hour(s)).       Radiology Studies: No results  found.      Scheduled Meds: . amLODipine  5 mg Oral Daily  . amoxicillin-clavulanate  1 tablet Oral Q12H  . enoxaparin (LOVENOX) injection  0.5 mg/kg Subcutaneous Q24H  . ketorolac  15 mg Intravenous Q6H  . metoprolol tartrate  12.5 mg Oral BID  . risperiDONE  1 mg Oral BID  . sodium chloride flush  3 mL Intravenous Q12H  . thiamine injection  100 mg Intravenous Daily   Or  . thiamine  100 mg Oral Daily  . vitamin B-12  1,000 mcg Oral Daily   Continuous Infusions:   LOS: 12 days    Time spent: 15 minutes    Sidney Ace, MD [facility] Pager [phone]  If 7PM-7AM, please contact night-coverage 03/10/2020, 1:10 PM

## 2020-03-10 NOTE — TOC Progression Note (Addendum)
Transition of Care [facility]) - Progression Note    Patient Details  Name: Charlotte Lane MRN: [id number] Date of Birth: 19-Feb-1946  Transition of Care [facility]) CM/SW [place], LCSW Phone Number: 03/10/2020, 12:33 PM  Clinical Narrative:  Damaris Schooner with Colletta Maryland at [facility]. They will have to know that patient will qualify for special assistance Medicaid and that it will pay retroactively before they can take her. She will call son to get an update from him. CSW left voicemail for DSS social worker.   2:08 pm: DSS social worker said they will not know if patient qualifies for Medicaid until she is approved in 45 days. CSW faxed FL2 for her to give her Medicaid worker.  3:19 pm: Colletta Maryland at [facility] said they just need confirmation from [place] that patient meets the qualifications to be approved for Medicaid before they can take her. Have scheduled a video-chat assessment tomorrow morning for 10:00. Patient and son are aware.  Expected Discharge Plan: [place] Barriers to Discharge: Continued Medical Work up, Other (comment) (Patient does not have capacity)  Expected Discharge Plan and Services Expected Discharge Plan: [place] In-house Referral: Clinical Social Work     Living arrangements for the past 2 months: Single Family Home                                       Social Determinants of Health (SDOH) Interventions    Readmission Risk Interventions No flowsheet data found.

## 2020-03-11 ENCOUNTER — Encounter: Payer: Self-pay | Admitting: Internal Medicine

## 2020-03-11 NOTE — TOC Progression Note (Addendum)
Transition of Care [facility]) - Progression Note    Patient Details  Name: Charlotte Lane MRN: [id number] Date of Birth: 07-Mar-1946  Transition of Care [facility]) CM/SW [place], LCSW Phone Number: 03/11/2020, 10:45 AM  Clinical Narrative:  Assessment completed with [facility]. She is concerned patient may not meet criteria for memory care. Patient was able to answer all of her questions. Knew what year it was, the president, etc. Sent message to attending and psychiatrist to update and asked psychiatrist to re-evaluate for capacity.  11:57 am: Faxed FL2 to [place] at [facility].  4:28 pm: Faxed psych note and yesterday and today's MD notes to [facility].  Expected Discharge Plan: [place] Barriers to Discharge: Continued Medical Work up, Other (comment) (Patient does not have capacity)  Expected Discharge Plan and Services Expected Discharge Plan: [place] In-house Referral: Clinical Social Work     Living arrangements for the past 2 months: Single Family Home                                       Social Determinants of Health (SDOH) Interventions    Readmission Risk Interventions No flowsheet data found.

## 2020-03-11 NOTE — Care Management Important Message (Signed)
Important Message  Patient Details  Name: Charlotte Lane MRN: [id number] Date of Birth: 10/05/1945   Medicare Important Message Given:  Yes     [name] 03/11/2020, 11:16 AM

## 2020-03-11 NOTE — Progress Notes (Addendum)
Progress Note    Charlotte Lane  QPY:[id number] DOB: 20-Dec-1945  DOA: 02/26/2020 PCP: [facility]      Brief Narrative:    Medical records reviewed and are as summarized below:  Charlotte Lane is a 74 y.o. female       Assessment/Plan:   Principal Problem:   Dementia ([place]) Active Problems:   HTN (hypertension)   Paroxysmal atrial fibrillation with rapid ventricular response (HCC)   Altered mental status    Paroxysmal atrial fibrillation with RVR: She converted to NSR.  She is not on anticoagulation because of concerns about decision-making capacity and inability to care for herself.   Dental caries/odontogenic infection: Continue Augmentin.  Analgesics as needed for pain.  Cognitive impairment/dementia: Patient was reevaluated by the psychiatrist today.  He maintains that patient does not have capacity to make decisions for himself.  Hypertension: Currently antihypertensives  Vitamin B12 deficiency: Continue vitamin B12 vitamin.   Diet Order            Diet Heart Room service appropriate? Yes; Fluid consistency: Thin  Diet effective now                    Consultants:  Psychiatrist  Procedures:  None    Medications:   . amLODipine  5 mg Oral Daily  . amoxicillin-clavulanate  1 tablet Oral Q12H  . enoxaparin (LOVENOX) injection  0.5 mg/kg Subcutaneous Q24H  . ketorolac  15 mg Intravenous Q6H  . metoprolol tartrate  12.5 mg Oral BID  . risperiDONE  1 mg Oral BID  . sodium chloride flush  3 mL Intravenous Q12H  . thiamine injection  100 mg Intravenous Daily   Or  . thiamine  100 mg Oral Daily  . vitamin B-12  1,000 mcg Oral Daily   Continuous Infusions:   Anti-infectives (From admission, onward)   Start     Dose/Rate Route Frequency Ordered Stop   03/08/20 2200  amoxicillin-clavulanate (AUGMENTIN) 875-125 MG per tablet 1 tablet        1 tablet Oral Every 12 hours 03/08/20 1547                Family Communication/Anticipated D/C date and plan/Code Status   DVT prophylaxis:      Code Status: Full Code  Family Communication: None Disposition Plan:    Status is: Inpatient  Remains inpatient appropriate because:Unsafe d/c plan   Dispo: The patient is from: ALF              Anticipated d/c is to: ALF              Anticipated d/c date is: 3 days              Patient currently is medically stable to d/c.           Subjective:   Interval events noted.  She has no complaints.  Objective:    Vitals:   03/10/20 2001 03/11/20 0001 03/11/20 0924 03/11/20 1110  BP: 138/74 124/68 110/75 111/60  Pulse: 88 65 70 (!) 59  Resp: 18 16 16    Temp: 98.5 F (36.9 C) 98.5 F (36.9 C) 97.6 F (36.4 C) 97.6 F (36.4 C)  TempSrc:  Oral Oral Oral  SpO2: 97% 96% 99% 100%  Weight:      Height:       No data found.   Intake/Output Summary (Last 24 hours) at 03/11/2020 1450 Last data  filed at 03/10/2020 2144 Gross per 24 hour  Intake 3 ml  Output --  Net 3 ml   Filed Weights   02/27/20 0400 02/27/20 0617 02/27/20 2300  Weight: 56.9 kg 56.9 kg 95.4 kg    Exam:  GEN: NAD SKIN: Warm and dry EYES: EOMI ENT: MMM CV: RRR PULM: CTA B ABD: soft, ND, NT, +BS CNS: AAO x 1 (person), non focal EXT: No edema or tenderness PSYCH: Inattentive, nervous   Data Reviewed:   I have personally reviewed following labs and imaging studies:  Labs: Labs show the following:   Basic Metabolic Panel: Recent Labs  Lab 03/09/20 0427  NA 139  K 3.9  CL 104  CO2 27  GLUCOSE 97  BUN 20  CREATININE 1.13*  CALCIUM 9.5   GFR Estimated Creatinine Clearance: 45.8 mL/min (A) (by C-G formula based on SCr of 1.13 mg/dL (H)). Liver Function Tests: No results for input(s): AST, ALT, ALKPHOS, BILITOT, PROT, ALBUMIN in the last 168 hours. No results for input(s): LIPASE, AMYLASE in the last 168 hours. No results for input(s): AMMONIA in the last 168  hours. Coagulation profile No results for input(s): INR, PROTIME in the last 168 hours.  CBC: Recent Labs  Lab 03/09/20 0427  WBC 8.1  NEUTROABS 4.5  HGB 13.1  HCT 40.3  MCV 90.4  PLT 291   Cardiac Enzymes: No results for input(s): CKTOTAL, CKMB, CKMBINDEX, TROPONINI in the last 168 hours. BNP (last 3 results) No results for input(s): PROBNP in the last 8760 hours. CBG: No results for input(s): GLUCAP in the last 168 hours. D-Dimer: No results for input(s): DDIMER in the last 72 hours. Hgb A1c: No results for input(s): HGBA1C in the last 72 hours. Lipid Profile: No results for input(s): CHOL, HDL, LDLCALC, TRIG, CHOLHDL, LDLDIRECT in the last 72 hours. Thyroid function studies: No results for input(s): TSH, T4TOTAL, T3FREE, THYROIDAB in the last 72 hours.  Invalid input(s): FREET3 Anemia work up: No results for input(s): VITAMINB12, FOLATE, FERRITIN, TIBC, IRON, RETICCTPCT in the last 72 hours. Sepsis Labs: Recent Labs  Lab 03/09/20 0427  WBC 8.1    Microbiology No results found for this or any previous visit (from the past 240 hour(s)).  Procedures and diagnostic studies:  No results found.             LOS: 13 days   [place] Copywriter, advertising on [email or web address]. If 7PM-7AM, please contact night-coverage at [email or web address]     03/11/2020, 2:50 PM

## 2020-03-11 NOTE — Consult Note (Signed)
[facility] Consult   Reason for Consult:   Follow-up consult for this patient with atrial fibrillation and dementia.  Treatment team asked me to reassess capacity Referring Physician:  Mal Misty Patient Identification: Charlotte Lane MRN:  [id number] Principal Diagnosis: Dementia [facility]) Diagnosis:  Principal Problem:   Dementia ([place]) Active Problems:   HTN (hypertension)   Paroxysmal atrial fibrillation with rapid ventricular response ([place])   Altered mental status   Total Time spent with patient: 30 minutes  Subjective:   Charlotte Lane is a 74 y.o. female patient admitted with "I do not really know".  HPI: Patient seen chart reviewed.  I had seen patient when she first came into the emergency room and early in her hospital stay.  On reassessment today the patient has no specific complaints.  She knows that she is in the hospital and she was able to guess or remember that it was somewhere near [place] but was otherwise somewhat disoriented.  She claimed that no one had ever spoken to her in her whole hospital stay about any of her medical problems and that she has no idea why she was brought into the hospital.  She was not aware of any medications that she was taking regularly.  Patient could not remember where she was staying before she came into the hospital.  Many of her answers to the sorts of questions are extremely vague and will be things such as "this that in the third" or "here there and yonder" when asked to describe her life before coming into the hospital.  When asked to be more specific she will get puzzled and flustered and not be able to give any detail.  Patient could repeat 3 words immediately but after 3 minutes could not remember any of them and did not seem to really even remember that I had asked her to try.  When asked to tell me where she will be planning to go when she leaves the hospital she could not be specific at all.  She said that she thought that her  son was arranging it for her but she was puzzled by the fact that she had not spoken to her son the whole time she was in the hospital.  I ask her a little bit about her finances.  She tells me that she gets a Control and instrumentation engineer and that it comes in the mail to her home but she had not been living at her home recently.  She tells me that her bank is [employer].  She did not correct her self and did not remember when I pointed out that [employer] had been out of business for several years now.  She is not reporting depression and suicidal or homicidal ideation or psychotic symptoms. Past Psychiatric History: See previous notes.  It looks like there may have been some past substance use problems.  Risk to Self:   Risk to Others:   Prior Inpatient Therapy:   Prior Outpatient Therapy:    Past Medical History:  Past Medical History:  Diagnosis Date  . Hypertension     Past Surgical History:  Procedure Laterality Date  . CYST EXCISION Right 03/06/2019   Procedure: EXCISION SEBACEOUS CYST SCALP;  Surgeon: Aviva Signs, MD;  Location: [facility];  Service: General;  Laterality: Right;  . NO PAST SURGERIES     Family History:  Family History  Problem Relation Age of Onset  . Heart attack Father    Family Psychiatric  History:  Unclear Social History:  Social History   Substance and Sexual Activity  Alcohol Use No     Social History   Substance and Sexual Activity  Drug Use No    Social History   Socioeconomic History  . Marital status: Single    Spouse name: Not on file  . Number of children: Not on file  . Years of education: Not on file  . Highest education level: Not on file  Occupational History  . Not on file  Tobacco Use  . Smoking status: Never Smoker  . Smokeless tobacco: Never Used  Vaping Use  . Vaping Use: Never used  Substance and Sexual Activity  . Alcohol use: No  . Drug use: No  . Sexual activity: Not on file  Other Topics Concern  . Not on file  Social  History Narrative  . Not on file   Social Determinants of Health   Financial Resource Strain:   . Difficulty of Paying Living Expenses: Not on file  Food Insecurity:   . Worried About Charity fundraiser in the Last Year: Not on file  . Ran Out of Food in the Last Year: Not on file  Transportation Needs:   . Lack of Transportation (Medical): Not on file  . Lack of Transportation (Non-Medical): Not on file  Physical Activity:   . Days of Exercise per Week: Not on file  . Minutes of Exercise per Session: Not on file  Stress:   . Feeling of Stress : Not on file  Social Connections:   . Frequency of Communication with Friends and Family: Not on file  . Frequency of Social Gatherings with Friends and Family: Not on file  . Attends Religious Services: Not on file  . Active Member of Clubs or Organizations: Not on file  . Attends Archivist Meetings: Not on file  . Marital Status: Not on file   Additional Social History:    Allergies:  No Known Allergies  Labs: No results found for this or any previous visit (from the past 48 hour(s)).  Current Facility-Administered Medications  Medication Dose Route Frequency Provider Last Rate Last Admin  . acetaminophen (TYLENOL) tablet 650 mg  650 mg Oral Q6H PRN Lenore Cordia, MD   650 mg at 03/08/20 1406   Or  . acetaminophen (TYLENOL) suppository 650 mg  650 mg Rectal Q6H PRN Zada Finders R, MD      . amLODipine (NORVASC) tablet 5 mg  5 mg Oral Daily Fritzi Mandes, MD   5 mg at 03/11/20 0940  . amoxicillin-clavulanate (AUGMENTIN) 875-125 MG per tablet 1 tablet  1 tablet Oral Q12H Ralene Muskrat B, MD   1 tablet at 03/11/20 0940  . benzocaine (ORAJEL) 10 % mucosal gel   Mouth/Throat QID PRN Sreenath, Sudheer B, MD      . enoxaparin (LOVENOX) injection 47.5 mg  0.5 mg/kg Subcutaneous Q24H Rocky Morel, RPH   47.5 mg at 03/10/20 2146  . ketorolac (TORADOL) 15 MG/ML injection 15 mg  15 mg Intravenous Q6H Sreenath, Sudheer B, MD    15 mg at 03/10/20 0518  . magic mouthwash w/lidocaine  10 mL Oral QID PRN Ralene Muskrat B, MD   10 mL at 03/08/20 2104  . metoprolol tartrate (LOPRESSOR) tablet 12.5 mg  12.5 mg Oral BID Zada Finders R, MD   12.5 mg at 03/11/20 0940  . oxyCODONE (Oxy IR/ROXICODONE) immediate release tablet 5 mg  5 mg Oral Q6H PRN Sreenath,  Sudheer B, MD      . risperiDONE (RISPERDAL M-TABS) disintegrating tablet 1 mg  1 mg Oral BID Emila Steinhauser, Madie Reno, MD   1 mg at 03/11/20 0940  . sodium chloride flush (NS) 0.9 % injection 3 mL  3 mL Intravenous Q12H Lenore Cordia, MD   3 mL at 03/11/20 0941  . thiamine (B-1) injection 100 mg  100 mg Intravenous Daily Donnetta Simpers, MD       Or  . thiamine tablet 100 mg  100 mg Oral Daily Donnetta Simpers, MD   100 mg at 03/11/20 0940  . vitamin B-12 (CYANOCOBALAMIN) tablet 1,000 mcg  1,000 mcg Oral Daily Donnetta Simpers, MD   1,000 mcg at 03/11/20 0940    Musculoskeletal: Strength & Muscle Tone: within normal limits Gait & Station: normal Patient leans: N/A  Psychiatric Specialty Exam: Physical Exam Vitals and nursing note reviewed.  Constitutional:      Appearance: She is well-developed.  HENT:     Head: Normocephalic and atraumatic.  Eyes:     Conjunctiva/sclera: Conjunctivae normal.     Pupils: Pupils are equal, round, and reactive to light.  Cardiovascular:     Heart sounds: Normal heart sounds.  Pulmonary:     Effort: Pulmonary effort is normal.  Abdominal:     Palpations: Abdomen is soft.  Musculoskeletal:        General: Normal range of motion.     Cervical back: Normal range of motion.  Skin:    General: Skin is warm and dry.  Neurological:     General: No focal deficit present.     Mental Status: She is alert.  Psychiatric:        Attention and Perception: She is inattentive.        Mood and Affect: Mood is anxious. Affect is blunt.        Speech: Speech is delayed.        Behavior: Behavior is slowed.        Thought Content:  Thought content is not paranoid. Thought content does not include homicidal or suicidal ideation.        Cognition and Memory: Cognition is impaired. Memory is impaired.     Review of Systems  Constitutional: Negative.   HENT: Negative.   Eyes: Negative.   Respiratory: Negative.   Cardiovascular: Negative.   Gastrointestinal: Negative.   Musculoskeletal: Negative.   Skin: Negative.   Neurological: Negative.   Psychiatric/Behavioral: Positive for confusion. The patient is nervous/anxious.     Blood pressure 111/60, pulse (!) 59, temperature 97.6 F (36.4 C), temperature source Oral, resp. rate 16, height 5' (1.524 m), weight 95.4 kg, SpO2 100 %.Body mass index is 41.08 kg/m.  General Appearance: Casual  Eye Contact:  Fair  Speech:  Slow  Volume:  Decreased  Mood:  Euthymic  Affect:  Constricted  Thought Process:  Disorganized  Orientation:  Other:  Organized basically to place but somewhat disorganized as to time and completely unorganized about situation  Thought Content:  Tangential  Suicidal Thoughts:  No  Homicidal Thoughts:  No  Memory:  Immediate;   Fair Recent;   Poor Remote;   Poor  Judgement:  Impaired  Insight:  Shallow  Psychomotor Activity:  Decreased  Concentration:  Concentration: Poor  Recall:  Poor  Fund of Knowledge:  Fair  Language:  Fair  Akathisia:  No  Handed:  Right  AIMS (if indicated):     Assets:  Desire for Improvement Resilience  ADL's:  Impaired  Cognition:  Impaired,  Mild  Sleep:        Treatment Plan Summary: Plan I was asked to reassess capacity because apparently someone came to see the patient about placement and felt that she was largely cognitively intact.  Patient does come across superficially as being okay and is able to have a very basic conversation lucidly.  Getting down however to details and her memory it seems clear that she has very significant short-term memory problems.  Patients with short-term memory issues often can  present as superficially intact but in fact have very little or no ability to care for themselves on a day-to-day basis.  Based on my interaction with her today I think the patient still does not have the ability to take care of herself or make reasonable decisions because of her cognitive impairment.  I note also that just last night there are notes in the chart about her being obviously confused.  Patients with short-term memory can vary from time to time and often will seem worse at night.  No change to medication.  At this point I would not change my assessment that the patient does not really have capacity to make reasonable decisions for herself.  Disposition: Patient does not meet criteria for psychiatric inpatient admission. Supportive therapy provided about ongoing stressors.  Alethia Berthold, MD 03/11/2020 2:08 PM

## 2020-03-12 MED ORDER — ASPIRIN EC 81 MG PO TBEC
81.0000 mg | DELAYED_RELEASE_TABLET | Freq: Every day | ORAL | Status: DC
  Administered 2020-03-12 – 2020-03-16 (×5): 81 mg via ORAL
  Filled 2020-03-12 (×5): qty 1

## 2020-03-12 MED ORDER — TUBERCULIN PPD 5 UNIT/0.1ML ID SOLN
5.0000 [IU] | Freq: Once | INTRADERMAL | Status: AC
  Administered 2020-03-12: 5 [IU] via INTRADERMAL
  Filled 2020-03-12: qty 0.1

## 2020-03-12 NOTE — Progress Notes (Addendum)
Progress Note    Charlotte Lane  IEP:[id number] DOB: 1945/08/01  DOA: 02/26/2020 PCP: [facility]      Brief Narrative:    Medical records reviewed and are as summarized below:  Charlotte Lane is a 74 y.o. female       Assessment/Plan:   Principal Problem:   Dementia ([place]) Active Problems:   HTN (hypertension)   Paroxysmal atrial fibrillation with rapid ventricular response ([facility])   Altered mental status    Paroxysmal atrial fibrillation with RVR: Heart rate is controlled.  She is not on anticoagulation because of concerns about decision-making capacity and medical adherence.  In place of this, low-dose aspirin will be used for stroke prophylaxis for now.  Dental caries/odontogenic infection: Continue Augmentin for 1 more day.  Analgesics as needed for pain.  Cognitive impairment/dementia: She was reevaluated by the psychiatrist on 05/12/2019.  She still does not have capacity to make decisions for herself.   Hypertension: Currently antihypertensives  Vitamin B12 deficiency: Continue vitamin B12 vitamin.  TB skin test/ PPD test has been ordered for discharge planning.   Diet Order            DIET DYS 3 Room service appropriate? No; Fluid consistency: Thin  Diet effective now                    Consultants:  Psychiatrist  Procedures:  None    Medications:   . amLODipine  5 mg Oral Daily  . amoxicillin-clavulanate  1 tablet Oral Q12H  . enoxaparin (LOVENOX) injection  0.5 mg/kg Subcutaneous Q24H  . ketorolac  15 mg Intravenous Q6H  . metoprolol tartrate  12.5 mg Oral BID  . risperiDONE  1 mg Oral BID  . sodium chloride flush  3 mL Intravenous Q12H  . thiamine injection  100 mg Intravenous Daily   Or  . thiamine  100 mg Oral Daily  . vitamin B-12  1,000 mcg Oral Daily   Continuous Infusions:   Anti-infectives (From admission, onward)   Start     Dose/Rate Route Frequency Ordered Stop   03/08/20 2200   amoxicillin-clavulanate (AUGMENTIN) 875-125 MG per tablet 1 tablet        1 tablet Oral Every 12 hours 03/08/20 1547               Family Communication/Anticipated D/C date and plan/Code Status   DVT prophylaxis:      Code Status: Full Code  Family Communication: None Disposition Plan:    Status is: Inpatient  Remains inpatient appropriate because:Unsafe d/c plan   Dispo: The patient is from: ALF              Anticipated d/c is to: ALF              Anticipated d/c date is: 3 days              Patient currently is medically stable to d/c.           Subjective:   Interval events noted.  No acute issues overnight.  She feels okay today.  Objective:    Vitals:   03/11/20 1949 03/11/20 2100 03/12/20 1026 03/12/20 1124  BP: (!) 124/51  120/68 120/69  Pulse: 66 65 70 (!) 53  Resp: 18  18 16   Temp: 98.3 F (36.8 C)  97.9 F (36.6 C) 98.2 F (36.8 C)  TempSrc: Oral  Oral Oral  SpO2: 94%  98% 99%  Weight:      Height:       No data found.   Intake/Output Summary (Last 24 hours) at 03/12/2020 1227 Last data filed at 03/12/2020 1000 Gross per 24 hour  Intake 120 ml  Output --  Net 120 ml   Filed Weights   02/27/20 0400 02/27/20 0617 02/27/20 2300  Weight: 56.9 kg 56.9 kg 95.4 kg    Exam:   GEN: NAD SKIN: No rash EYES: EOMI ENT: MMM CV: RRR PULM: CTA B ABD: soft, ND, NT, +BS CNS: AAO x 2 (person and place), non focal EXT: No edema or tenderness    Data Reviewed:   I have personally reviewed following labs and imaging studies:  Labs: Labs show the following:   Basic Metabolic Panel: Recent Labs  Lab 03/09/20 0427  NA 139  K 3.9  CL 104  CO2 27  GLUCOSE 97  BUN 20  CREATININE 1.13*  CALCIUM 9.5   GFR Estimated Creatinine Clearance: 45.8 mL/min (A) (by C-G formula based on SCr of 1.13 mg/dL (H)). Liver Function Tests: No results for input(s): AST, ALT, ALKPHOS, BILITOT, PROT, ALBUMIN in the last 168 hours. No results  for input(s): LIPASE, AMYLASE in the last 168 hours. No results for input(s): AMMONIA in the last 168 hours. Coagulation profile No results for input(s): INR, PROTIME in the last 168 hours.  CBC: Recent Labs  Lab 03/09/20 0427  WBC 8.1  NEUTROABS 4.5  HGB 13.1  HCT 40.3  MCV 90.4  PLT 291   Cardiac Enzymes: No results for input(s): CKTOTAL, CKMB, CKMBINDEX, TROPONINI in the last 168 hours. BNP (last 3 results) No results for input(s): PROBNP in the last 8760 hours. CBG: No results for input(s): GLUCAP in the last 168 hours. D-Dimer: No results for input(s): DDIMER in the last 72 hours. Hgb A1c: No results for input(s): HGBA1C in the last 72 hours. Lipid Profile: No results for input(s): CHOL, HDL, LDLCALC, TRIG, CHOLHDL, LDLDIRECT in the last 72 hours. Thyroid function studies: No results for input(s): TSH, T4TOTAL, T3FREE, THYROIDAB in the last 72 hours.  Invalid input(s): FREET3 Anemia work up: No results for input(s): VITAMINB12, FOLATE, FERRITIN, TIBC, IRON, RETICCTPCT in the last 72 hours. Sepsis Labs: Recent Labs  Lab 03/09/20 0427  WBC 8.1    Microbiology No results found for this or any previous visit (from the past 240 hour(s)).  Procedures and diagnostic studies:  No results found.             LOS: 14 days   [place] Copy[employer], advertising on [email or web address]. If 7PM-7AM, please contact night-coverage at [email or web address]     03/12/2020, 12:27 PM

## 2020-03-12 NOTE — Progress Notes (Signed)
Physical Therapy Treatment Patient Details Name: Charlotte Lane MRN: [id number] DOB: 1946-01-08 Today's Date: 03/12/2020    History of Present Illness Charlotte Lane is a 74 y.o. female with PMH significant for HTN, new onset Afib with RVR who was brought in under Involuntary commitment. She had a wellness check, was found to be in Afib. She refused coming to the ED by a physician and was brought in under IVC.    PT Comments    Pt was side lying in bed upon arriving. Greets therapist and is extremely pleasant throughout. Agrees to OOB activity with focus on dynamic balance. See balance exercises performed listed below. Overall pt ambulated > 1000 ft without difficulty. Will continued to benefit from skilled PT at DC to improve dynamic balance deficits while improving safety with ADLs. Recommend HHPT to follow. She was sitting in recliner post session with call bell in reach and RN/RN tech aware of pt's abilities.      Follow Up Recommendations  Home health PT;Other (comment) (at [facility] memory unit)     Equipment Recommendations  None recommended by PT    Recommendations for Other Services       Precautions / Restrictions Precautions Precautions: Fall Precaution Comments: low fall risk Restrictions Weight Bearing Restrictions: No    Mobility  Bed Mobility Overal bed mobility: Independent             General bed mobility comments: pt in recliner at start and end of session  Transfers Overall transfer level: Independent Equipment used: None Transfers: Sit to/from Stand Sit to Stand: Supervision         General transfer comment: supervision for safety  Ambulation/Gait Ambulation/Gait assistance: Supervision Gait Distance (Feet): 1000 Feet Assistive device: None Gait Pattern/deviations: WFL(Within Functional Limits) Gait velocity: WNL   General Gait Details: Easily able to ambulate without AD 100 ft. CGA during dynamic balance activities.         Balance Overall balance assessment: Needs assistance Sitting-balance support: Feet supported;No upper extremity supported Sitting balance-Leahy Scale: Normal Sitting balance - Comments: no sitting balance deficits   Standing balance support: No upper extremity supported;During functional activity Standing balance-Leahy Scale: Fair Standing balance comment: Performed dynamic balance/gait activities. Heel walking, toe walking, side stepping, grapevine/brading, marching, tandem and backwards gait. does have some unsteadiness during balance drills but overall demonstrated improvments      Cognition Arousal/Alertness: Awake/alert Behavior During Therapy: WFL for tasks assessed/performed Overall Cognitive Status: No family/caregiver present to determine baseline cognitive functioning        General Comments: Pt is alert but does have memory/cognition deficits. Consistently follows commands throughout and overall very pleasant.              Pertinent Vitals/Pain Pain Assessment: No/denies pain           PT Goals (current goals can now be found in the care plan section) Acute Rehab PT Goals Patient Stated Goal: go home Progress towards PT goals: Progressing toward goals    Frequency    Min 2X/week      PT Plan Current plan remains appropriate       AM-PAC PT "6 Clicks" Mobility   Outcome Measure  Help needed turning from your back to your side while in a flat bed without using bedrails?: None Help needed moving from lying on your back to sitting on the side of a flat bed without using bedrails?: None Help needed moving to and from a bed to a chair (including  a wheelchair)?: None Help needed standing up from a chair using your arms (e.g., wheelchair or bedside chair)?: None Help needed to walk in hospital room?: None Help needed climbing 3-5 steps with a railing? : A Little 6 Click Score: 23    End of Session Equipment Utilized During Treatment: Gait belt Activity  Tolerance: Patient tolerated treatment well Patient left: in chair;with call bell/phone within reach Nurse Communication: Mobility status PT Visit Diagnosis: Unsteadiness on feet (R26.81);Other abnormalities of gait and mobility (R26.89);Muscle weakness (generalized) (M62.81);Difficulty in walking, not elsewhere classified (R26.2)     Time: 1216-2446 PT Time Calculation (min) (ACUTE ONLY): 14 min  Charges:  $Gait Training: 8-22 mins                     Julaine Fusi PTA 03/12/20, 4:16 PM

## 2020-03-12 NOTE — Progress Notes (Signed)
Occupational Therapy Treatment Patient Details Name: Charlotte Lane MRN: [id number] DOB: 1946/02/05 Today's Date: 03/12/2020    History of present illness Charlotte Lane is a 74 y.o. female with PMH significant for HTN, new onset Afib with RVR who was brought in under Involuntary commitment. She had a wellness check, was found to be in Afib. She refused coming to the ED by a physician and was brought in under IVC.   OT comments  Pt seen for OT tx this date. Pt does not recall meeting therapist before. Pt agreeable to session focused on bathing. Pt provided with set up and supervision for safety for most of standing shower in bathroom. Set up and PRN CGA for standing LB dressing. Cues for safety as pt noted with a couple slight LOB during session; improved safety/ADL performance when using UE support on wall/grab bar/sink/counter to support herself. Pt verbalized understanding. Pt demonstrating poor safety awareness, decreased balance this date, and decreased STM, perseverating on a couple phrases during session but generally within the context of the session. Does well with "chit chat" conversation. Pt continues to benefit from skilled OT services to maximize independence and safety. Continue to recommend HHOT in an assisted living venue (possible memory care) to maximize safety, falls prevention, autonomy with basic ADL, and assist for safety with higher level IADL tasks.    Follow Up Recommendations  Supervision - Intermittent;Home health OT;Other (comment) (ALF/memory care setting)    Equipment Recommendations  None recommended by OT    Recommendations for Other Services      Precautions / Restrictions Precautions Precautions: Fall Precaution Comments: low fall risk Restrictions Weight Bearing Restrictions: No       Mobility Bed Mobility               General bed mobility comments: pt in recliner at start and end of session  Transfers Overall transfer level: Needs  assistance Equipment used: None Transfers: Sit to/from Stand Sit to Stand: Supervision         General transfer comment: supervision for safety    Balance Overall balance assessment: Needs assistance Sitting-balance support: Feet supported;No upper extremity supported Sitting balance-Leahy Scale: Good     Standing balance support: No upper extremity supported;During functional activity Standing balance-Leahy Scale: Fair Standing balance comment: a couple mild LOB noted during standing bathing/dressing tasks requiring SBA to CGA and pt able to correct                           ADL either performed or assessed with clinical judgement   ADL Overall ADL's : Needs assistance/impaired     Grooming: Wash/dry face;Oral care;Brushing hair;Set up;Supervision/safety;Minimal assistance;Wash/dry hands Grooming Details (indicate cue type and reason): set up and supervision to brush teeth, wash face/hands standing at sink; Min A to assist with tangles while brushing hair after shower Upper Body Bathing: Supervision/ safety;Set up;Standing   Lower Body Bathing: Supervison/ safety;Set up;Sit to/from stand Lower Body Bathing Details (indicate cue type and reason): used UE support on shower grab bar vs shower wall while washing BLE with mild LOB which she was able to self correct quickly Upper Body Dressing : Standing;Set up;Supervision/safety   Lower Body Dressing: Min guard;Sit to/from stand Lower Body Dressing Details (indicate cue type and reason): in standing, pt required CGA for donning socks and threading pants over feet 2/2 near LOB with RLE crossing over LLE. Able to self correct, pt educated in falls prevention  General ADL Comments: Supervision for standing shower in shower stall, set up for soap and wash cloths. supervisoin for safety. SBA for standing LB drying and dressing with pt leaning lightly against wall and using UE support on bar while using other  to don sock in standing.     Vision Patient Visual Report: No change from baseline     Perception     Praxis      Cognition Arousal/Alertness: Awake/alert Behavior During Therapy: WFL for tasks assessed/performed                                   General Comments: pt alert, pleasant, and follows commands with PRN cues, demo's impaired STM with frequent repetition of information provided by pt and talks in generalities, difficulty with detail/specifics when confronted with questions.        Exercises     Shoulder Instructions       General Comments      Pertinent Vitals/ Pain       Pain Assessment: No/denies pain  Home Living                                          Prior Functioning/Environment              Frequency  Min 1X/week        Progress Toward Goals  OT Goals(current goals can now be found in the care plan section)  Progress towards OT goals: OT to reassess next treatment  Acute Rehab OT Goals Patient Stated Goal: To return home OT Goal Formulation: With patient Time For Goal Achievement: 03/16/20 Potential to Achieve Goals: Good  Plan Frequency remains appropriate;Discharge plan remains appropriate    Co-evaluation                 AM-PAC OT "6 Clicks" Daily Activity     Outcome Measure   Help from another person eating meals?: None Help from another person taking care of personal grooming?: A Little Help from another person toileting, which includes using toliet, bedpan, or urinal?: None Help from another person bathing (including washing, rinsing, drying)?: A Little Help from another person to put on and taking off regular upper body clothing?: None Help from another person to put on and taking off regular lower body clothing?: A Little 6 Click Score: 21    End of Session    OT Visit Diagnosis: Other abnormalities of gait and mobility (R26.89)   Activity Tolerance Patient tolerated  treatment well   Patient Left in chair;with call bell/phone within reach   Nurse Communication          Time: 6144-3154 OT Time Calculation (min): 24 min  Charges: OT General Charges $OT Visit: 1 Visit OT Treatments $Self Care/Home Management : 23-37 mins  Jeni Salles, MPH, MS, OTR/L ascom [phone] 03/12/20, 2:28 PM

## 2020-03-12 NOTE — TOC Progression Note (Signed)
Transition of Care [facility]) - Progression Note    Patient Details  Name: Charlotte Lane MRN: [id number] Date of Birth: 1945-05-25  Transition of Care [facility]) CM/SW [place], LCSW Phone Number: 03/12/2020, 1:33 PM  Clinical Narrative:  Video assessment complete with patient [employer]. They have confirmed the financial information they needed and son will complete paperwork. They are hoping to be able to accept her next week.   Expected Discharge Plan: [place] Barriers to Discharge: Continued Medical Work up, Other (comment) (Patient does not have capacity)  Expected Discharge Plan and Services Expected Discharge Plan: [place] In-house Referral: Clinical Social Work     Living arrangements for the past 2 months: Single Family Home                                       Social Determinants of Health (SDOH) Interventions    Readmission Risk Interventions No flowsheet data found.

## 2020-03-13 NOTE — TOC Progression Note (Addendum)
Transition of Care [facility]) - Progression Note    Patient Details  Name: Charlotte Lane MRN: [id number] Date of Birth: 04-Aug-1945  Transition of Care [facility]) CM/SW [place], LCSW Phone Number: 03/13/2020, 8:33 AM  Clinical Narrative:  MD ordered TB skin test yesterday for ALF admission. ALF administrators have spoken with son. He can transport her to the facility on Monday or Tuesday. Patient will need a COVID test within 3 days of admission. Will ask weekend [facility] team to have MD order on Sunday.   10:21 am: Updated patient.   Expected Discharge Plan: [place] Barriers to Discharge: Continued Medical Work up, Other (comment) (Patient does not have capacity)  Expected Discharge Plan and Services Expected Discharge Plan: [place] In-house Referral: Clinical Social Work     Living arrangements for the past 2 months: Single Family Home                                       Social Determinants of Health (SDOH) Interventions    Readmission Risk Interventions No flowsheet data found.

## 2020-03-13 NOTE — Progress Notes (Addendum)
Progress Note    JAQUANA GEIGER  TTS:[id number] DOB: Feb 12, 1946  DOA: 02/26/2020 PCP: [facility]      Brief Narrative:    Medical records reviewed and are as summarized below:  CASIDY ALBERTA is a 74 y.o. female was brought to the hospital for evaluation of atrial fibrillation with rapid ventricular response after EMS went to her house for a wellness check.  Reportedly, patient was placed under IVC by community paramedic after they had discussed the case with her PCP, Dr. Earma Reading.  Apparently, she did not want to come to the hospital.  She was admitted to the hospital for A. fib with RVR.  She has converted to normal sinus rhythm.  She was also treated with Augmentin for dental caries.  She will need outpatient follow-up with a dentist for further management.  She has vitamin B12 deficiency (B12 level 136) and she has been started on vitamin B12 supplements.  It is suspected that patient has underlying dementia.  She was seen by the neurologist.  She was evaluated by the psychiatrist and she does not have capacity to make decision for herself.   Unfortunately, patient cannot go be discharged back to her house for safety reasons.    Assessment/Plan:   Principal Problem:   Dementia ([place]) Active Problems:   HTN (hypertension)   Paroxysmal atrial fibrillation with rapid ventricular response (HCC)   Altered mental status    Paroxysmal atrial fibrillation with RVR: Heart rate is controlled.  She is not on anticoagulation because of concerns about decision-making capacity and medical adherence.  In place of this, low-dose aspirin will be used for stroke prophylaxis for now.  Dental caries/odontogenic infection: Augmentin will be completed today.   Cognitive impairment/suspected dementia: She was reevaluated by the psychiatrist on 03/11/2020.  She still does not have capacity to make decisions for herself.  Vitamin B1 level was normal.  RPR was  nonreactive.  Hypertension: Continue antihypertensives   Vitamin B12 deficiency: Continue vitamin B12 vitamin.  TB skin test/ PPD test was performed on 03/21/2020.   Diet Order            DIET DYS 3 Room service appropriate? No; Fluid consistency: Thin  Diet effective now                    Consultants:  Psychiatrist  Procedures:  None    Medications:   . amLODipine  5 mg Oral Daily  . amoxicillin-clavulanate  1 tablet Oral Q12H  . aspirin EC  81 mg Oral Daily  . enoxaparin (LOVENOX) injection  0.5 mg/kg Subcutaneous Q24H  . ketorolac  15 mg Intravenous Q6H  . metoprolol tartrate  12.5 mg Oral BID  . risperiDONE  1 mg Oral BID  . sodium chloride flush  3 mL Intravenous Q12H  . thiamine injection  100 mg Intravenous Daily   Or  . thiamine  100 mg Oral Daily  . tuberculin  5 Units Intradermal Once  . vitamin B-12  1,000 mcg Oral Daily   Continuous Infusions:   Anti-infectives (From admission, onward)   Start     Dose/Rate Route Frequency Ordered Stop   03/08/20 2200  amoxicillin-clavulanate (AUGMENTIN) 875-125 MG per tablet 1 tablet        1 tablet Oral Every 12 hours 03/08/20 1547               Family Communication/Anticipated D/C date and plan/Code Status  DVT prophylaxis:      Code Status: Full Code  Family Communication: None Disposition Plan:    Status is: Inpatient  Remains inpatient appropriate because:Unsafe d/c plan   Dispo: The patient is from: ALF              Anticipated d/c is to: ALF              Anticipated d/c date is: 3 days              Patient currently is medically stable to d/c.           Subjective:   Interval events noted.  No acute issues overnight.  She feels okay today.  Objective:    Vitals:   03/12/20 1943 03/12/20 2055 03/13/20 0842 03/13/20 1147  BP: 133/65  110/65 134/64  Pulse: 63 84 (!) 57 (!) 57  Resp: 16  14   Temp: 97.8 F (36.6 C)  97.8 F (36.6 C) (!) 97.3 F (36.3 C)   TempSrc: Oral  Oral Oral  SpO2: 95%  99% 98%  Weight:      Height:       No data found.  No intake or output data in the 24 hours ending 03/13/20 1309 Filed Weights   02/27/20 0400 02/27/20 0617 02/27/20 2300  Weight: 56.9 kg 56.9 kg 95.4 kg    Exam:  GEN: NAD SKIN: Area on the middle aspect of the anterior right forearm has been marked for PPD test EYES: EOMI ENT: MMM CV: RRR PULM: CTA B ABD: soft, ND, NT, +BS CNS: AAO x 1 (person), non focal EXT: No edema or tenderness     Data Reviewed:   I have personally reviewed following labs and imaging studies:  Labs: Labs show the following:   Basic Metabolic Panel: Recent Labs  Lab 03/09/20 0427  NA 139  K 3.9  CL 104  CO2 27  GLUCOSE 97  BUN 20  CREATININE 1.13*  CALCIUM 9.5   GFR Estimated Creatinine Clearance: 45.8 mL/min (A) (by C-G formula based on SCr of 1.13 mg/dL (H)). Liver Function Tests: No results for input(s): AST, ALT, ALKPHOS, BILITOT, PROT, ALBUMIN in the last 168 hours. No results for input(s): LIPASE, AMYLASE in the last 168 hours. No results for input(s): AMMONIA in the last 168 hours. Coagulation profile No results for input(s): INR, PROTIME in the last 168 hours.  CBC: Recent Labs  Lab 03/09/20 0427  WBC 8.1  NEUTROABS 4.5  HGB 13.1  HCT 40.3  MCV 90.4  PLT 291   Cardiac Enzymes: No results for input(s): CKTOTAL, CKMB, CKMBINDEX, TROPONINI in the last 168 hours. BNP (last 3 results) No results for input(s): PROBNP in the last 8760 hours. CBG: No results for input(s): GLUCAP in the last 168 hours. D-Dimer: No results for input(s): DDIMER in the last 72 hours. Hgb A1c: No results for input(s): HGBA1C in the last 72 hours. Lipid Profile: No results for input(s): CHOL, HDL, LDLCALC, TRIG, CHOLHDL, LDLDIRECT in the last 72 hours. Thyroid function studies: No results for input(s): TSH, T4TOTAL, T3FREE, THYROIDAB in the last 72 hours.  Invalid input(s): FREET3 Anemia work  up: No results for input(s): VITAMINB12, FOLATE, FERRITIN, TIBC, IRON, RETICCTPCT in the last 72 hours. Sepsis Labs: Recent Labs  Lab 03/09/20 0427  WBC 8.1    Microbiology No results found for this or any previous visit (from the past 240 hour(s)).  Procedures and diagnostic studies:  No results found.  LOS: 15 days   [employer] Copy[employer], advertising on [email or web address]. If 7PM-7AM, please contact night-coverage at [email or web address]     03/13/2020, 1:09 PM

## 2020-03-13 NOTE — Plan of Care (Signed)
Pt has met bed mobility, transfer, & gait goals. Initiated Balance goal to reduce pt's fall risk.    Lavone Nian, PT, DPT 03/13/20, 2:45 PM  Problem: Acute Rehab PT Goals(only PT should resolve) Goal: Pt Will Go Supine/Side To Sit Outcome: Completed/Met Goal: Patient Will Transfer Sit To/From Stand Outcome: Completed/Met Goal: Pt Will Transfer Bed To Chair/Chair To Bed Outcome: Completed/Met Goal: Pt Will Ambulate Outcome: Completed/Met   Problem: Acute Rehab PT Goals(only PT should resolve) Goal: PT Additional Goal #1 Flowsheets (Taken 03/13/2020 1444) Additional Goal #1: Pt will score >/= 55/56 on Berg Balance Test to reduce fall risk & increase safety & independence with functional mobility. Note: Pt will score >/= 55/56 on Berg Balance Test to reduce fall risk & increase safety & independence with functional mobility.

## 2020-03-13 NOTE — Progress Notes (Signed)
Physical Therapy Treatment Patient Details Name: Charlotte Lane MRN: [id number] DOB: 01/01/1946 Today's Date: 03/13/2020    History of Present Illness Charlotte Lane is a 74 y.o. female with PMH significant for HTN, new onset Afib with RVR who was brought in under Involuntary commitment. She had a wellness check, was found to be in Afib. She refused coming to the ED by a physician and was brought in under IVC.    PT Comments    Pt is pleasant & agreeable to tx. Pt with improved cognition as she was more oriented on this date. Pt negotiates 8 steps with 1 rail with supervision. Pt completes Berg Balance Test with improvement from 42/56 to 51/56, however pt still remains a moderate fall risk. Patient demonstrates increased fall risk as noted by score of 51/56 on Berg Balance Scale.  (<36= high risk for falls, close to 100%; 37-45 significant >80%; 46-51 moderate >50%; 52-55 lower >25%). Pt would benefit from ongoing high level balance training to reduce fall risk prior to d/c to ALF. Will continue to follow acutely to address stair negotiation & balance (goals to be modified to reflect this).    Follow Up Recommendations  Home health PT     Equipment Recommendations  None recommended by PT    Recommendations for Other Services       Precautions / Restrictions Precautions Precautions: Fall Precaution Comments: low fall risk Restrictions Weight Bearing Restrictions: No    Mobility  Bed Mobility Overal bed mobility: Independent                Transfers Overall transfer level: Independent   Transfers: Sit to/from Stand Sit to Stand: Independent            Ambulation/Gait Ambulation/Gait assistance: Independent Gait Distance (Feet):  (480 ft + 640 ft) Assistive device: None Gait Pattern/deviations: WFL(Within Functional Limits)     General Gait Details: Pt negotiates 8 steps with R descending/L ascending rail with supervision.   Stairs              Wheelchair Mobility    Modified Rankin (Stroke Patients Only)       Balance Overall balance assessment: Needs assistance   Sitting balance-Leahy Scale: Normal     Standing balance support: No upper extremity supported;During functional activity Standing balance-Leahy Scale: Fair                   Standardized Balance Assessment Standardized Balance Assessment : Berg Balance Test Berg Balance Test Sit to Stand: Able to stand without using hands and stabilize independently Standing Unsupported: Able to stand safely 2 minutes Sitting with Back Unsupported but Feet Supported on Floor or Stool: Able to sit safely and securely 2 minutes Stand to Sit: Sits safely with minimal use of hands Transfers: Able to transfer safely, minor use of hands Standing Unsupported with Eyes Closed: Able to stand 10 seconds safely Standing Ubsupported with Feet Together: Able to place feet together independently and stand for 1 minute with supervision From Standing, Reach Forward with Outstretched Arm: Can reach confidently >25 cm (10") From Standing Position, Pick up Object from Floor: Able to pick up shoe safely and easily From Standing Position, Turn to Look Behind Over each Shoulder: Looks behind from both sides and weight shifts well Turn 360 Degrees: Able to turn 360 degrees safely but slowly Standing Unsupported, Alternately Place Feet on Step/Stool: Able to stand independently and safely and complete 8 steps in 20 seconds Standing Unsupported, One  Foot in Front: Able to plae foot ahead of the other independently and hold 30 seconds Standing on One Leg: Able to lift leg independently and hold 5-10 seconds Total Score: 51        Cognition Arousal/Alertness: Awake/alert Behavior During Therapy: WFL for tasks assessed/performed Overall Cognitive Status: No family/caregiver present to determine baseline cognitive functioning                                 General  Comments: Pt oriented to city & "hospital", as well as month & year on this date, which is an improvement. Pt pleasant.      Exercises      General Comments        Pertinent Vitals/Pain      Home Living                      Prior Function            PT Goals (current goals can now be found in the care plan section) Acute Rehab PT Goals Patient Stated Goal: go home Progress towards PT goals: Progressing toward goals    Frequency    Min 2X/week      PT Plan Current plan remains appropriate    Co-evaluation              AM-PAC PT "6 Clicks" Mobility   Outcome Measure  Help needed turning from your back to your side while in a flat bed without using bedrails?: None Help needed moving from lying on your back to sitting on the side of a flat bed without using bedrails?: None Help needed moving to and from a bed to a chair (including a wheelchair)?: None Help needed standing up from a chair using your arms (e.g., wheelchair or bedside chair)?: None Help needed to walk in hospital room?: None Help needed climbing 3-5 steps with a railing? : A Little 6 Click Score: 23    End of Session Equipment Utilized During Treatment: Gait belt Activity Tolerance: Patient tolerated treatment well Patient left: in bed;with call bell/phone within reach;with bed alarm set   PT Visit Diagnosis: Unsteadiness on feet (R26.81);Other abnormalities of gait and mobility (R26.89);Muscle weakness (generalized) (M62.81);Difficulty in walking, not elsewhere classified (R26.2)     Time: 9381-8299 PT Time Calculation (min) (ACUTE ONLY): 23 min  Charges:  $Therapeutic Activity: 23-37 mins                     Lavone Nian, PT, DPT 03/13/20, 2:43 PM    Waunita Schooner 03/13/2020, 2:42 PM

## 2020-03-14 NOTE — Progress Notes (Signed)
Progress Note    Charlotte Lane  AVW:[id number] DOB: 1945-05-23  DOA: 02/26/2020 PCP: [facility]      Brief Narrative:    Medical records reviewed and are as summarized below:  Charlotte Lane is a 74 y.o. female was brought to the hospital for evaluation of atrial fibrillation with rapid ventricular response after EMS went to her house for a wellness check.  Reportedly, patient was placed under IVC by community paramedic after they had discussed the case with her PCP, Dr. Earma Reading.  Apparently, she did not want to come to the hospital.  She was admitted to the hospital for A. fib with RVR.  She has converted to normal sinus rhythm.  She was also treated with Augmentin for dental caries.  She will need outpatient follow-up with a dentist for further management.  She has vitamin B12 deficiency (B12 level 136) and she has been started on vitamin B12 supplements.  It is suspected that patient has underlying dementia.  She was seen by the neurologist.  She was evaluated by the psychiatrist and she does not have capacity to make decision for herself.   Unfortunately, patient cannot go be discharged back to her house for safety reasons.    Assessment/Plan:   Principal Problem:   Dementia ([place]) Active Problems:   HTN (hypertension)   Paroxysmal atrial fibrillation with rapid ventricular response (HCC)   Altered mental status    Paroxysmal atrial fibrillation with RVR: Heart rate is controlled.  She is not on anticoagulation because of concerns about decision-making capacity and medical adherence.  Continue aspirin  Dental caries/odontogenic infection: She completed Augmentin on 03/13/2020.   Cognitive impairment/suspected dementia: She was reevaluated by the psychiatrist on 03/11/2020.  She still does not have capacity to make decisions for herself.  Vitamin B1 level was normal.  RPR was nonreactive.  Hypertension: Continue antihypertensives    Vitamin B12 deficiency: Continue vitamin B12 vitamin.  TB skin test/ PPD test was performed on 03/21/2020.   Diet Order            DIET DYS 3 Room service appropriate? No; Fluid consistency: Thin  Diet effective now                    Consultants:  Psychiatrist  Procedures:  None    Medications:   . amLODipine  5 mg Oral Daily  . aspirin EC  81 mg Oral Daily  . enoxaparin (LOVENOX) injection  0.5 mg/kg Subcutaneous Q24H  . metoprolol tartrate  12.5 mg Oral BID  . risperiDONE  1 mg Oral BID  . sodium chloride flush  3 mL Intravenous Q12H  . thiamine injection  100 mg Intravenous Daily   Or  . thiamine  100 mg Oral Daily  . tuberculin  5 Units Intradermal Once  . vitamin B-12  1,000 mcg Oral Daily   Continuous Infusions:   Anti-infectives (From admission, onward)   Start     Dose/Rate Route Frequency Ordered Stop   03/08/20 2200  amoxicillin-clavulanate (AUGMENTIN) 875-125 MG per tablet 1 tablet        1 tablet Oral Every 12 hours 03/08/20 1547 03/13/20 2202             Family Communication/Anticipated D/C date and plan/Code Status   DVT prophylaxis:      Code Status: Full Code  Family Communication: None Disposition Plan:    Status is: Inpatient  Remains inpatient appropriate because:Unsafe  d/c plan   Dispo: The patient is from: ALF              Anticipated d/c is to: ALF              Anticipated d/c date is: 3 days              Patient currently is medically stable to d/c.           Subjective:   Interval events noted.  Patient is ambulatory and has no complaints.  Objective:    Vitals:   03/13/20 1936 03/13/20 2333 03/14/20 0452 03/14/20 0822  BP: 104/85 (!) 109/54 (!) 155/80 123/62  Pulse: 73 63 (!) 52 (!) 58  Resp: 20 18 18 18   Temp: 97.8 F (36.6 C) 97.9 F (36.6 C) 97.8 F (36.6 C) 97.7 F (36.5 C)  TempSrc: Oral Oral Oral Oral  SpO2: 98% 96% 97% 99%  Weight:      Height:       No data  found.   Intake/Output Summary (Last 24 hours) at 03/14/2020 1100 Last data filed at 03/14/2020 0800 Gross per 24 hour  Intake 340 ml  Output --  Net 340 ml   Filed Weights   02/27/20 0400 02/27/20 0617 02/27/20 2300  Weight: 56.9 kg 56.9 kg 95.4 kg    Exam:  GEN: NAD SKIN: No acute erythema on the anterior aspect of right forearm (site for PPD test) EYES: No pallor or icterus ENT: MMM CV: RRR PULM: CTA B ABD: soft, ND, NT, +BS CNS: AAO x 1, non focal EXT: No edema or tenderness    Data Reviewed:   I have personally reviewed following labs and imaging studies:  Labs: Labs show the following:   Basic Metabolic Panel: Recent Labs  Lab 03/09/20 0427  NA 139  K 3.9  CL 104  CO2 27  GLUCOSE 97  BUN 20  CREATININE 1.13*  CALCIUM 9.5   GFR Estimated Creatinine Clearance: 45.8 mL/min (A) (by C-G formula based on SCr of 1.13 mg/dL (H)). Liver Function Tests: No results for input(s): AST, ALT, ALKPHOS, BILITOT, PROT, ALBUMIN in the last 168 hours. No results for input(s): LIPASE, AMYLASE in the last 168 hours. No results for input(s): AMMONIA in the last 168 hours. Coagulation profile No results for input(s): INR, PROTIME in the last 168 hours.  CBC: Recent Labs  Lab 03/09/20 0427  WBC 8.1  NEUTROABS 4.5  HGB 13.1  HCT 40.3  MCV 90.4  PLT 291   Cardiac Enzymes: No results for input(s): CKTOTAL, CKMB, CKMBINDEX, TROPONINI in the last 168 hours. BNP (last 3 results) No results for input(s): PROBNP in the last 8760 hours. CBG: No results for input(s): GLUCAP in the last 168 hours. D-Dimer: No results for input(s): DDIMER in the last 72 hours. Hgb A1c: No results for input(s): HGBA1C in the last 72 hours. Lipid Profile: No results for input(s): CHOL, HDL, LDLCALC, TRIG, CHOLHDL, LDLDIRECT in the last 72 hours. Thyroid function studies: No results for input(s): TSH, T4TOTAL, T3FREE, THYROIDAB in the last 72 hours.  Invalid input(s): FREET3 Anemia  work up: No results for input(s): VITAMINB12, FOLATE, FERRITIN, TIBC, IRON, RETICCTPCT in the last 72 hours. Sepsis Labs: Recent Labs  Lab 03/09/20 0427  WBC 8.1    Microbiology No results found for this or any previous visit (from the past 240 hour(s)).  Procedures and diagnostic studies:  No results found.  LOS: 16 days   [place] Copywriter, advertising on [email or web address]. If 7PM-7AM, please contact night-coverage at [email or web address]     03/14/2020, 11:00 AM

## 2020-03-15 LAB — SARS CORONAVIRUS 2 BY RT PCR (HOSPITAL ORDER, PERFORMED IN ~~LOC~~ HOSPITAL LAB): SARS Coronavirus 2: NEGATIVE

## 2020-03-15 NOTE — TOC Progression Note (Addendum)
Transition of Care [facility]) - Progression Note    Patient Details  Name: Charlotte Lane MRN: [id number] Date of Birth: 03-08-1946  [facility]) CM/SW Contact  Truitt Merle, LCSW Phone Number: 03/15/2020, 9:58 AM  Clinical Narrative:    Dr. Mal Misty contacted via secure chat to request COVID test be ordered today. Per medical record, COVID test order placed.   UPDATE: COVID results are negative. Received message from [place] to contact patient's son. Called son [name] at [phone] to discuss discharge plans. Son was concerned about patient "freaking out" about going to the facility and about if he's made the best decision for patient. Provided supportive counseling.   Son also asked about time frame for discharge as he will need to coordinate with his job to be able to provide transport for patient. Son stated "earlier is better." Explained to son that weekday SW will contact son with updates about time frames for discharge, as it will depend on the facility and hospital team. Son thankful for assistance. RN Barnabas Lister notified. TOC continuing to follow.   Expected Discharge Plan: [place] Barriers to Discharge: Continued Medical Work up, Other (comment) (Patient does not have capacity)  Expected Discharge Plan and Services Expected Discharge Plan: [place] In-house Referral: Clinical Social Work     Living arrangements for the past 2 months: Single Family Home                                       Social Determinants of Health (SDOH) Interventions    Readmission Risk Interventions No flowsheet data found.

## 2020-03-15 NOTE — Progress Notes (Addendum)
Progress Note    TANA TREFRY  JSE:[id number] DOB: 05/18/1945  DOA: 02/26/2020 PCP: [facility]      Brief Narrative:    Medical records reviewed and are as summarized below:  Charlotte Lane is a 74 y.o. female was brought to the hospital for evaluation of atrial fibrillation with rapid ventricular response after EMS went to her house for a wellness check.  Reportedly, patient was placed under IVC by community paramedic after they had discussed the case with her PCP, Dr. Earma Reading.  Apparently, she did not want to come to the hospital.  She was admitted to the hospital for A. fib with RVR.  She has converted to normal sinus rhythm.  She was also treated with Augmentin for dental caries.  She will need outpatient follow-up with a dentist for further management.  She has vitamin B12 deficiency (B12 level 136) and she has been started on vitamin B12 supplements.  It is suspected that patient has underlying dementia.  She was seen by the neurologist.  She was evaluated by the psychiatrist and she does not have capacity to make decision for herself.   Unfortunately, patient cannot go be discharged back to her house for safety reasons.    Assessment/Plan:   Principal Problem:   Dementia ([place]) Active Problems:   HTN (hypertension)   Paroxysmal atrial fibrillation with rapid ventricular response (HCC)   Altered mental status    Paroxysmal atrial fibrillation with RVR: Heart rate is controlled.  She is not on anticoagulation because of concerns about decision-making capacity and medical adherence.  Continue aspirin  Dental caries/odontogenic infection: She completed Augmentin on 03/13/2020.  Cognitive impairment/suspected dementia: She was reevaluated by the psychiatrist on 03/11/2020.  She still does not have capacity to make decisions for herself.  Vitamin B1 level was normal.  RPR was nonreactive.  Hypertension: Continue  antihypertensives  Vitamin B12 deficiency: Continue vitamin B12 supplement  TB skin test/ PPD test was performed on 03/21/2020. No skin induration thus far.  Covid test has been ordered in preparation for possible discharge to SNF tomorrow.   Diet Order            DIET DYS 3 Room service appropriate? No; Fluid consistency: Thin  Diet effective now                    Consultants:  Psychiatrist  Procedures:  None    Medications:   . amLODipine  5 mg Oral Daily  . aspirin EC  81 mg Oral Daily  . enoxaparin (LOVENOX) injection  0.5 mg/kg Subcutaneous Q24H  . metoprolol tartrate  12.5 mg Oral BID  . risperiDONE  1 mg Oral BID  . sodium chloride flush  3 mL Intravenous Q12H  . thiamine injection  100 mg Intravenous Daily   Or  . thiamine  100 mg Oral Daily  . vitamin B-12  1,000 mcg Oral Daily   Continuous Infusions:   Anti-infectives (From admission, onward)   Start     Dose/Rate Route Frequency Ordered Stop   03/08/20 2200  amoxicillin-clavulanate (AUGMENTIN) 875-125 MG per tablet 1 tablet        1 tablet Oral Every 12 hours 03/08/20 1547 03/13/20 2202             Family Communication/Anticipated D/C date and plan/Code Status   DVT prophylaxis:      Code Status: Full Code  Family Communication: None Disposition Plan:  Status is: Inpatient  Remains inpatient appropriate because:Unsafe d/c plan   Dispo: The patient is from: ALF              Anticipated d/c is to: ALF              Anticipated d/c date is: 1 day              Patient currently is medically stable to d/c.           Subjective:   She has no complaints.  She feels okay.  Interval events noted.  Objective:    Vitals:   03/14/20 1641 03/14/20 1943 03/15/20 0458 03/15/20 0805  BP: 129/62 (!) 115/96 (!) 104/59 106/74  Pulse: (!) 53 (!) 58 (!) 56 (!) 59  Resp: 18 18 18 18   Temp: 97.8 F (36.6 C) 98.2 F (36.8 C) 98.2 F (36.8 C) 98.3 F (36.8 C)  TempSrc:  Oral Oral Oral Oral  SpO2: 93%  97% 99%  Weight:      Height:       No data found.   Intake/Output Summary (Last 24 hours) at 03/15/2020 1129 Last data filed at 03/14/2020 2320 Gross per 24 hour  Intake 0 ml  Output --  Net 0 ml   Filed Weights   02/27/20 0400 02/27/20 0617 02/27/20 2300  Weight: 56.9 kg 56.9 kg 95.4 kg    Exam:  GEN: NAD SKIN: No induration at PPD test site on the anterior aspect of the left forearm. EYES: EOMI ENT: MMM CV: RRR PULM: CTA B ABD: soft, ND, NT, +BS CNS: AAO x 1, non focal EXT: No edema or tenderness    Data Reviewed:   I have personally reviewed following labs and imaging studies:  Labs: Labs show the following:   Basic Metabolic Panel: Recent Labs  Lab 03/09/20 0427  NA 139  K 3.9  CL 104  CO2 27  GLUCOSE 97  BUN 20  CREATININE 1.13*  CALCIUM 9.5   GFR Estimated Creatinine Clearance: 45.8 mL/min (A) (by C-G formula based on SCr of 1.13 mg/dL (H)). Liver Function Tests: No results for input(s): AST, ALT, ALKPHOS, BILITOT, PROT, ALBUMIN in the last 168 hours. No results for input(s): LIPASE, AMYLASE in the last 168 hours. No results for input(s): AMMONIA in the last 168 hours. Coagulation profile No results for input(s): INR, PROTIME in the last 168 hours.  CBC: Recent Labs  Lab 03/09/20 0427  WBC 8.1  NEUTROABS 4.5  HGB 13.1  HCT 40.3  MCV 90.4  PLT 291   Cardiac Enzymes: No results for input(s): CKTOTAL, CKMB, CKMBINDEX, TROPONINI in the last 168 hours. BNP (last 3 results) No results for input(s): PROBNP in the last 8760 hours. CBG: No results for input(s): GLUCAP in the last 168 hours. D-Dimer: No results for input(s): DDIMER in the last 72 hours. Hgb A1c: No results for input(s): HGBA1C in the last 72 hours. Lipid Profile: No results for input(s): CHOL, HDL, LDLCALC, TRIG, CHOLHDL, LDLDIRECT in the last 72 hours. Thyroid function studies: No results for input(s): TSH, T4TOTAL, T3FREE, THYROIDAB  in the last 72 hours.  Invalid input(s): FREET3 Anemia work up: No results for input(s): VITAMINB12, FOLATE, FERRITIN, TIBC, IRON, RETICCTPCT in the last 72 hours. Sepsis Labs: Recent Labs  Lab 03/09/20 0427  WBC 8.1    Microbiology No results found for this or any previous visit (from the past 240 hour(s)).  Procedures and diagnostic studies:  No results found.  LOS: 17 days   [place] Copywriter, advertising on [email or web address]. If 7PM-7AM, please contact night-coverage at [email or web address]     03/15/2020, 11:29 AM

## 2020-03-16 LAB — CBC
HCT: 38.4 % (ref 36.0–46.0)
Hemoglobin: 13 g/dL (ref 12.0–15.0)
MCH: 30.2 pg (ref 26.0–34.0)
MCHC: 33.9 g/dL (ref 30.0–36.0)
MCV: 89.1 fL (ref 80.0–100.0)
Platelets: 353 10*3/uL (ref 150–400)
RBC: 4.31 MIL/uL (ref 3.87–5.11)
RDW: 13.4 % (ref 11.5–15.5)
WBC: 6.5 10*3/uL (ref 4.0–10.5)
nRBC: 0 % (ref 0.0–0.2)

## 2020-03-16 LAB — CREATININE, SERUM
Creatinine, Ser: 1.01 mg/dL — ABNORMAL HIGH (ref 0.44–1.00)
GFR, Estimated: 59 mL/min — ABNORMAL LOW (ref >60)

## 2020-03-16 MED ORDER — ASPIRIN 81 MG PO TBEC
81.0000 mg | DELAYED_RELEASE_TABLET | Freq: Every day | ORAL | Status: DC
Start: 2020-03-17 — End: 2021-06-11

## 2020-03-16 MED ORDER — RISPERIDONE 1 MG PO TBDP: 1.0000 mg | ORAL_TABLET | Freq: Two times a day (BID) | ORAL | Status: AC

## 2020-03-16 MED ORDER — CYANOCOBALAMIN 1000 MCG PO TABS: 1000.0000 ug | ORAL_TABLET | Freq: Every day | ORAL | Status: AC

## 2020-03-16 MED ORDER — METOPROLOL TARTRATE 25 MG PO TABS: 12.5000 mg | ORAL_TABLET | Freq: Two times a day (BID) | ORAL | Status: AC

## 2020-03-16 NOTE — TOC Progression Note (Addendum)
Transition of Care [facility]) - Progression Note    Patient Details  Name: Charlotte Lane MRN: [id number] Date of Birth: Feb 03, 1946  Transition [facility]) CM/SW Contact  Candie Chroman, LCSW Phone Number: 03/16/2020, 9:01 AM  Clinical Narrative: Faxed information on TB skin test and COVID results to [facility].  9:21 am: Juliann Pulse will review fax and call CSW back. Notified her that son is here to transport her to the facility today.  10:22 am: [facility] patient today. MD is aware and confirmed patient will not discharge on any injections, per [facility] request. Updated DSS social worker.  11:38 am: Faxed FL2 and discharge summary to [facility].  Expected Discharge Plan: [place] Barriers to Discharge: Continued Medical Work up, Other (comment) (Patient does not have capacity)  Expected Discharge Plan and Services Expected Discharge Plan: [place] In-house Referral: Clinical Social Work     Living arrangements for the past 2 months: Single Family Home                                       Social Determinants of Health (SDOH) Interventions    Readmission Risk Interventions No flowsheet data found.

## 2020-03-16 NOTE — TOC Transition Note (Signed)
Transition of Care [facility]) - CM/SW Discharge Note   Patient Details  Name: Charlotte Lane MRN: [id number] Date of Birth: 1945-12-17  [facility]) CM/SW Contact:  Candie Chroman, LCSW Phone Number: 03/16/2020, 1:01 PM   Clinical Narrative: Patient has orders to discharge to [facility] today. Facility phone lines are down so will send signed FL2 and discharge summary with patient and her son in a sealed envelope. No further concerns. CSW signing off.    Final next level of care: Assisted Living Barriers to Discharge: Barriers Resolved   Patient Goals and CMS Choice Patient states their goals for this hospitalization and ongoing recovery are:: [facility] does not have capacity.  Report was made to [place].   Choice offered to / list presented to : Adult Children  Discharge Placement              Patient chooses bed at: Other - please specify in the comment section below: ([facility]) Patient to be transferred to facility by: Son will drive. Name of family member notified: Marlou Sa Ward Patient and family notified of of transfer: 03/16/20  Discharge Plan and Services In-house Referral: Clinical Social Work                                   Social Determinants of Health (SDOH) Interventions     Readmission Risk Interventions No flowsheet data found.

## 2020-03-16 NOTE — Discharge Summary (Addendum)
Physician Discharge Summary  Charlotte Lane CBJ:[id number] DOB: June 20, 1945 DOA: 02/26/2020  PCP: [facility] date: 02/26/2020 Discharge date: 03/16/2020  Discharge disposition: SNF   Recommendations for Outpatient Follow-Up:   Follow up with physician at the nursing home within 3 days of discharge   Discharge Diagnosis:   Principal Problem:   Dementia ([place]) Active Problems:   HTN (hypertension)   Paroxysmal atrial fibrillation with rapid ventricular response ([place])   Altered mental status    Discharge Condition: Stable.  Diet recommendation:  Diet Order            Diet - low sodium heart healthy           DIET DYS 3 Room service appropriate? No; Fluid consistency: Thin  Diet effective now                   Code Status: Full Code     Hospital Course:   Charlotte Lane is a 74 y.o. female was brought to the hospital for evaluation of atrial fibrillation with rapid ventricular response after EMS went to her house for a wellness check.  Reportedly, patient was placed under IVC by community paramedic after they had discussed the case with her PCP, Dr. Earma Reading.  Apparently, she did not want to come to the hospital.  She was admitted to the hospital for A. fib with RVR.  She has converted to normal sinus rhythm.  There was some concern about starting long-term anticoagulation for stroke prophylaxis because of persistent confusion and inability to make decision for herself.  Therefore, she was started on low-dose aspirin.  This was discussed with her son, Marguerite Olea. She was also treated with Augmentin for dental caries.  She will need outpatient follow-up with a dentist for further management.  She has vitamin B12 deficiency (B12 level 136) and she has been started on vitamin B12 supplements.  It is suspected that patient has underlying dementia.  She was seen by the neurologist.  She was evaluated by the psychiatrist and she does not  have capacity to make decision for herself.   Unfortunately, patient cannot be discharged back to her house for safety reasons.      Medical Consultants:    Psychiatrist   Discharge Exam:    Vitals:   03/15/20 1957 03/15/20 2358 03/16/20 0413 03/16/20 0725  BP: (!) 117/52 (!) 119/48 (!) 105/92 (!) 121/45  Pulse: 68 (!) 53 (!) 50 (!) 53  Resp: 20 18 16    Temp: 98.4 F (36.9 C) 97.8 F (36.6 C) (!) 97.4 F (36.3 C) 98.4 F (36.9 C)  TempSrc: Oral  Oral Oral  SpO2: 99% 97% 96% 97%  Weight:      Height:         GEN: NAD SKIN: No rash EYES: EOMI ENT: MMM CV: RRR PULM: CTA B ABD: soft, ND, NT, +BS CNS: AAO x 2 (person and place), non focal EXT: No edema or tenderness   The results of significant diagnostics from this hospitalization (including imaging, microbiology, ancillary and laboratory) are listed below for reference.     Procedures and Diagnostic Studies:   MR BRAIN WO CONTRAST  Result Date: 02/28/2020 CLINICAL DATA:  Altered mental status EXAM: MRI HEAD WITHOUT CONTRAST TECHNIQUE: Multiplanar, multiecho pulse sequences of the brain and surrounding structures were obtained without intravenous contrast. COMPARISON:  None. FINDINGS: Brain: There is no acute infarction or intracranial hemorrhage. There is no intracranial mass, mass  effect, or edema. There is no hydrocephalus or extra-axial fluid collection. Prominence of the ventricles and sulci reflects generalized parenchymal volume loss. Patchy T2 hyperintensity in the supratentorial white matter is nonspecific probably reflects mild to moderate chronic microvascular ischemic changes. Ill-defined susceptibility in the inferior left frontal lobe is difficult to localize exactly but appears to involve the anterior perforated substance. This likely reflects chronic blood products. Vascular: Major vessel flow voids at the skull base are preserved. Skull and upper cervical spine: Normal marrow signal is preserved.  Sinuses/Orbits: Paranasal sinuses are aerated. Orbits are unremarkable. Other: Sella is unremarkable.  Mastoid air cells are clear. IMPRESSION: No evidence recent infarction, hemorrhage, or mass. Mild to moderate chronic microvascular ischemic changes. Chronic blood products in the region of the left anterior perforated substance. Electronically Signed   By: Macy Mis M.D.   On: 02/28/2020 14:24   ECHOCARDIOGRAM COMPLETE  Result Date: 02/27/2020    ECHOCARDIOGRAM REPORT   Patient Name:   Charlotte Lane Date of Exam: 02/27/2020 Medical Rec #:  [id number]       Height:       60.0 in Accession #:    [id number]      Weight:       125.4 lb Date of Birth:  1945-06-22      BSA:          1.531 m Patient Age:    74 years        BP:           153/73 mmHg Patient Gender: F               HR:           75 bpm. Exam Location:  [facility] Procedure: 2D Echo, Cardiac Doppler and Color Doppler Indications:     Atrial Fibrillation 427.31  History:         Patient has no prior history of Echocardiogram examinations.                  Risk Factors:Hypertension.  Sonographer:     JERRY Referring Phys:  [id number] Cleaster Corin PATEL Diagnosing Phys: Ida Rogue MD IMPRESSIONS  1. Left ventricular ejection fraction, by estimation, is 60 to 65%. The left ventricle has normal function. The left ventricle has no regional wall motion abnormalities. Left ventricular diastolic parameters are consistent with Grade I diastolic dysfunction (impaired relaxation).  2. Right ventricular systolic function is normal. The right ventricular size is normal. Tricuspid regurgitation signal is inadequate for assessing PA pressure.  3. The mitral valve is normal in structure. Mild mitral valve regurgitation. FINDINGS  Left Ventricle: Left ventricular ejection fraction, by estimation, is 60 to 65%. The left ventricle has normal function. The left ventricle has no regional wall motion abnormalities. The left ventricular internal cavity size was normal in size.  There is  no left ventricular hypertrophy. Left ventricular diastolic parameters are consistent with Grade I diastolic dysfunction (impaired relaxation). Right Ventricle: The right ventricular size is normal. No increase in right ventricular wall thickness. Right ventricular systolic function is normal. Tricuspid regurgitation signal is inadequate for assessing PA pressure. Left Atrium: Left atrial size was normal in size. Right Atrium: Right atrial size was normal in size. Pericardium: There is no evidence of pericardial effusion. Mitral Valve: The mitral valve is normal in structure. Mild mitral valve regurgitation. No evidence of mitral valve stenosis. Tricuspid Valve: The tricuspid valve is normal in structure. Tricuspid valve regurgitation is not demonstrated. No evidence of  tricuspid stenosis. Aortic Valve: The aortic valve is normal in structure. Aortic valve regurgitation is not visualized. No aortic stenosis is present. Aortic valve mean gradient measures 3.0 mmHg. Aortic valve peak gradient measures 5.4 mmHg. Aortic valve area, by VTI measures 2.68 cm. Pulmonic Valve: The pulmonic valve was normal in structure. Pulmonic valve regurgitation is not visualized. No evidence of pulmonic stenosis. Aorta: The aortic root is normal in size and structure. Venous: The inferior vena cava is normal in size with greater than 50% respiratory variability, suggesting right atrial pressure of 3 mmHg. IAS/Shunts: No atrial level shunt detected by color flow Doppler.  LEFT VENTRICLE PLAX 2D LVIDd:         3.88 cm  Diastology LVIDs:         2.31 cm  LV e' medial:    3.92 cm/s LV PW:         1.40 cm  LV E/e' medial:  18.8 LV IVS:        0.97 cm  LV e' lateral:   4.57 cm/s LVOT diam:     2.00 cm  LV E/e' lateral: 16.1 LV SV:         73 LV SV Index:   47 LVOT Area:     3.14 cm  RIGHT VENTRICLE RV Basal diam:  2.45 cm RV S prime:     12.70 cm/s TAPSE (M-mode): 3.2 cm LEFT ATRIUM             Index       RIGHT ATRIUM            Index LA diam:        3.30 cm 2.16 cm/m  RA Area:     10.10 cm LA Vol (A2C):   55.4 ml 36.19 ml/m RA Volume:   17.90 ml  11.69 ml/m LA Vol (A4C):   55.7 ml 36.38 ml/m LA Biplane Vol: 55.4 ml 36.19 ml/m  AORTIC VALVE                   PULMONIC VALVE AV Area (Vmax):    2.69 cm    PV Vmax:        0.74 m/s AV Area (Vmean):   2.38 cm    PV Peak grad:   2.2 mmHg AV Area (VTI):     2.68 cm    RVOT Peak grad: 3 mmHg AV Vmax:           116.50 cm/s AV Vmean:          85.750 cm/s AV VTI:            0.271 m AV Peak Grad:      5.4 mmHg AV Mean Grad:      3.0 mmHg LVOT Vmax:         99.80 cm/s LVOT Vmean:        65.000 cm/s LVOT VTI:          0.231 m LVOT/AV VTI ratio: 0.85  AORTA Ao Root diam: 2.80 cm MITRAL VALVE                TRICUSPID VALVE MV Area (PHT): 3.00 cm     TR Peak grad:   4.0 mmHg MV Decel Time: 253 msec     TR Vmax:        100.00 cm/s MV E velocity: 73.70 cm/s MV A velocity: 104.00 cm/s  SHUNTS MV E/A ratio:  0.71  Systemic VTI:  0.23 m                             Systemic Diam: 2.00 cm Ida Rogue MD Electronically signed by Ida Rogue MD Signature Date/Time: 02/27/2020/12:38:47 PM    Final      Labs:   Basic Metabolic Panel: Recent Labs  Lab 03/16/20 0535  CREATININE 1.01*   GFR Estimated Creatinine Clearance: 51.3 mL/min (A) (by C-G formula based on SCr of 1.01 mg/dL (H)). Liver Function Tests: No results for input(s): AST, ALT, ALKPHOS, BILITOT, PROT, ALBUMIN in the last 168 hours. No results for input(s): LIPASE, AMYLASE in the last 168 hours. No results for input(s): AMMONIA in the last 168 hours. Coagulation profile No results for input(s): INR, PROTIME in the last 168 hours.  CBC: Recent Labs  Lab 03/16/20 0535  WBC 6.5  HGB 13.0  HCT 38.4  MCV 89.1  PLT 353   Cardiac Enzymes: No results for input(s): CKTOTAL, CKMB, CKMBINDEX, TROPONINI in the last 168 hours. BNP: Invalid input(s): POCBNP CBG: No results for input(s): GLUCAP in the last 168  hours. D-Dimer No results for input(s): DDIMER in the last 72 hours. Hgb A1c No results for input(s): HGBA1C in the last 72 hours. Lipid Profile No results for input(s): CHOL, HDL, LDLCALC, TRIG, CHOLHDL, LDLDIRECT in the last 72 hours. Thyroid function studies No results for input(s): TSH, T4TOTAL, T3FREE, THYROIDAB in the last 72 hours.  Invalid input(s): FREET3 Anemia work up No results for input(s): VITAMINB12, FOLATE, FERRITIN, TIBC, IRON, RETICCTPCT in the last 72 hours. Microbiology Recent Results (from the past 240 hour(s))  SARS Coronavirus 2 by RT PCR (hospital order, performed in [facility] hospital lab) Nasopharyngeal Nasopharyngeal Swab     Status: None   Collection Time: 03/15/20 10:38 AM   Specimen: Nasopharyngeal Swab  Result Value Ref Range Status   SARS Coronavirus 2 NEGATIVE NEGATIVE Final    Comment: (NOTE) SARS-CoV-2 target nucleic acids are NOT DETECTED.  The SARS-CoV-2 RNA is generally detectable in upper and lower respiratory specimens during the acute phase of infection. The lowest concentration of SARS-CoV-2 viral copies this assay can detect is 250 copies / mL. A negative result does not preclude SARS-CoV-2 infection and should not be used as the sole basis for treatment or other patient management decisions.  A negative result may occur with improper specimen collection / handling, submission of specimen other than nasopharyngeal swab, presence of viral mutation(s) within the areas targeted by this assay, and inadequate number of viral copies (<250 copies / mL). A negative result must be combined with clinical observations, patient history, and epidemiological information.  Fact Sheet for Patients:   StrictlyIdeas.no  Fact Sheet for Healthcare Providers: BankingDealers.co.za  This test is not yet approved or  cleared by the [place] FDA and has been authorized for detection and/or diagnosis of  SARS-CoV-2 by FDA under an Emergency Use Authorization (EUA).  This EUA will remain in effect (meaning this test can be used) for the duration of the COVID-19 declaration under Section 564(b)(1) of the Act, 21 U.S.C. section 360bbb-3(b)(1), unless the authorization is terminated or revoked sooner.  Performed at [facility], [address] [place], [place] [address]      Discharge Instructions:   Discharge Instructions    Diet - low sodium heart healthy   Complete by: As directed    Discharge instructions   Complete by: As directed  Follow up with physician at the nursing home within 3 days of discharge.   Increase activity slowly   Complete by: As directed      Allergies as of 03/16/2020   No Known Allergies     Medication List    STOP taking these medications   amLODipine 5 MG tablet Commonly known as: NORVASC   lisinopril 20 MG tablet Commonly known as: ZESTRIL   OLANZapine 2.5 MG tablet Commonly known as: ZYPREXA     TAKE these medications   aspirin 81 MG EC tablet Take 1 tablet (81 mg total) by mouth daily. Swallow whole. Start taking on: March 17, 2020   cyanocobalamin 1000 MCG tablet Take 1 tablet (1,000 mcg total) by mouth daily. Start taking on: March 17, 2020   metoprolol tartrate 25 MG tablet Commonly known as: LOPRESSOR Take 0.5 tablets (12.5 mg total) by mouth 2 (two) times daily.   risperiDONE 1 MG disintegrating tablet Commonly known as: RISPERDAL M-TABS Take 1 tablet (1 mg total) by mouth 2 (two) times daily.         Time coordinating discharge: 32 minutes  Signed:  [place]  [facility] 03/16/2020, 10:43 AM   Pager on [email or web address]. If 7PM-7AM, please contact night-coverage at [email or web address]

## 2020-03-16 NOTE — NC FL2 (Signed)
[place] LEVEL OF CARE SCREENING TOOL     IDENTIFICATION  Patient Name: Charlotte Lane Birthdate: 03/21/1946 Sex: female Admission Date (Current Location): 02/26/2020  [place] and [place] Number:  Charlotte Lane (Son lives in [place])   Facility and Address:  [facility], [address], [place], [place] [address]      Provider Number: [id number]  Attending Physician Name and Address:  Jennye Boroughs, MD  Relative Name and Phone Number:       Current Level of Care: Hospital Recommended Level of Care: Memory Care Prior Approval Number:    Date Approved/Denied:   PASRR Number:    Discharge Plan: Other (Comment) (Memory Care)    Current Diagnoses: Patient Active Problem List   Diagnosis Date Noted  . Dementia ([place]) 02/27/2020  . Paroxysmal atrial fibrillation with rapid ventricular response ([place]) 02/26/2020  . Altered mental status 02/26/2020  . Sebaceous cyst   . HTN (hypertension) 06/25/2015  . Chest pain with moderate risk for cardiac etiology 06/25/2015  . Chest pain 01/23/2013    Orientation RESPIRATION BLADDER Height & Weight     Self, Place  Normal Continent Weight: 210 lb 5.1 oz (95.4 kg) Height:  5' (152.4 cm)  BEHAVIORAL SYMPTOMS/MOOD NEUROLOGICAL BOWEL NUTRITION STATUS   (None)  (Dementia) Continent Diet (DYS 3. Low sodium.)  AMBULATORY STATUS COMMUNICATION OF NEEDS Skin   Supervision Verbally Normal                       Personal Care Assistance Level of Assistance  Bathing, Feeding, Dressing Bathing Assistance: Independent (Modified) Feeding assistance: Independent (Modified) Dressing Assistance: Independent (Modified)     Functional Limitations Info  Sight, Hearing, Speech Sight Info: Adequate Hearing Info: Adequate Speech Info: Adequate    SPECIAL CARE FACTORS FREQUENCY                       Contractures Contractures Info: Not present    Additional Factors Info  Code Status,  Allergies Code Status Info: Full code Allergies Info: NKDA           Current Medications (03/16/2020):  This is the current hospital active medication list Current Facility-Administered Medications  Medication Dose Route Frequency Provider Last Rate Last Admin  . acetaminophen (TYLENOL) tablet 650 mg  650 mg Oral Q6H PRN Lenore Cordia, MD   650 mg at 03/08/20 1406   Or  . acetaminophen (TYLENOL) suppository 650 mg  650 mg Rectal Q6H PRN Zada Finders R, MD      . amLODipine (NORVASC) tablet 5 mg  5 mg Oral Daily Fritzi Mandes, MD   5 mg at 03/16/20 0941  . aspirin EC tablet 81 mg  81 mg Oral Daily Jennye Boroughs, MD   81 mg at 03/16/20 0941  . benzocaine (ORAJEL) 10 % mucosal gel   Mouth/Throat QID PRN Sreenath, Sudheer B, MD      . enoxaparin (LOVENOX) injection 47.5 mg  0.5 mg/kg Subcutaneous Q24H Rocky Morel, RPH   47.5 mg at 03/15/20 2232  . magic mouthwash w/lidocaine  10 mL Oral QID PRN Ralene Muskrat B, MD   10 mL at 03/08/20 2104  . metoprolol tartrate (LOPRESSOR) tablet 12.5 mg  12.5 mg Oral BID Zada Finders R, MD   12.5 mg at 03/15/20 2231  . oxyCODONE (Oxy IR/ROXICODONE) immediate release tablet 5 mg  5 mg Oral Q6H PRN Sidney Ace, MD      .  risperiDONE (RISPERDAL M-TABS) disintegrating tablet 1 mg  1 mg Oral BID Clapacs, Madie Reno, MD   1 mg at 03/16/20 0942  . sodium chloride flush (NS) 0.9 % injection 3 mL  3 mL Intravenous Q12H Zada Finders R, MD   3 mL at 03/14/20 1246  . thiamine (B-1) injection 100 mg  100 mg Intravenous Daily Donnetta Simpers, MD       Or  . thiamine tablet 100 mg  100 mg Oral Daily Donnetta Simpers, MD   100 mg at 03/16/20 0941  . vitamin B-12 (CYANOCOBALAMIN) tablet 1,000 mcg  1,000 mcg Oral Daily Donnetta Simpers, MD   1,000 mcg at 03/16/20 0941     Discharge Medications: STOP taking these medications   amLODipine 5 MG tablet Commonly known as: NORVASC   lisinopril 20 MG tablet Commonly known as: ZESTRIL   OLANZapine 2.5  MG tablet Commonly known as: ZYPREXA     TAKE these medications   aspirin 81 MG EC tablet Take 1 tablet (81 mg total) by mouth daily. Swallow whole. Start taking on: March 17, 2020   cyanocobalamin 1000 MCG tablet Take 1 tablet (1,000 mcg total) by mouth daily. Start taking on: March 17, 2020   metoprolol tartrate 25 MG tablet Commonly known as: LOPRESSOR Take 0.5 tablets (12.5 mg total) by mouth 2 (two) times daily.   risperiDONE 1 MG disintegrating tablet Commonly known as: RISPERDAL M-TABS Take 1 tablet (1 mg total) by mouth 2 (two) times daily.     Relevant Imaging Results:  Relevant Lab Results:   Additional Information SS#: [id number]  Candie Chroman, LCSW

## 2020-03-16 NOTE — Care Management Important Message (Signed)
Important Message  Patient Details  Name: Charlotte Lane MRN: [id number] Date of Birth: [birth year]   Medicare Important Message Given:  Yes     [name] 03/16/2020, 11:14 AM

## 2021-06-08 ENCOUNTER — Other Ambulatory Visit: Payer: Self-pay

## 2021-06-08 ENCOUNTER — Emergency Department (HOSPITAL_COMMUNITY): Payer: Medicare Other

## 2021-06-08 ENCOUNTER — Encounter (HOSPITAL_COMMUNITY): Payer: Self-pay

## 2021-06-08 ENCOUNTER — Inpatient Hospital Stay (HOSPITAL_COMMUNITY)
Admission: EM | Admit: 2021-06-08 | Discharge: 2021-06-11 | DRG: 522 | Disposition: A | Discharge status: Home Health | Payer: Medicare Other | Source: Skilled Nursing Facility | Attending: Internal Medicine | Admitting: Internal Medicine

## 2021-06-08 ENCOUNTER — Inpatient Hospital Stay (HOSPITAL_COMMUNITY): Payer: Medicare Other

## 2021-06-08 DIAGNOSIS — Z8249 Family history of ischemic heart disease and other diseases of the circulatory system: Secondary | ICD-10-CM

## 2021-06-08 DIAGNOSIS — W1830XA Fall on same level, unspecified, initial encounter: Secondary | ICD-10-CM | POA: Diagnosis present

## 2021-06-08 DIAGNOSIS — W19XXXD Unspecified fall, subsequent encounter: Secondary | ICD-10-CM | POA: Diagnosis not present

## 2021-06-08 DIAGNOSIS — S72001A Fracture of unspecified part of neck of right femur, initial encounter for closed fracture: Principal | ICD-10-CM | POA: Diagnosis present

## 2021-06-08 DIAGNOSIS — E876 Hypokalemia: Secondary | ICD-10-CM | POA: Diagnosis present

## 2021-06-08 DIAGNOSIS — Z6821 Body mass index (BMI) 21.0-21.9, adult: Secondary | ICD-10-CM

## 2021-06-08 DIAGNOSIS — Z20822 Contact with and (suspected) exposure to covid-19: Secondary | ICD-10-CM | POA: Diagnosis not present

## 2021-06-08 DIAGNOSIS — M25551 Pain in right hip: Secondary | ICD-10-CM

## 2021-06-08 DIAGNOSIS — Z7982 Long term (current) use of aspirin: Secondary | ICD-10-CM

## 2021-06-08 DIAGNOSIS — Z96641 Presence of right artificial hip joint: Secondary | ICD-10-CM

## 2021-06-08 DIAGNOSIS — E440 Moderate protein-calorie malnutrition: Secondary | ICD-10-CM | POA: Diagnosis present

## 2021-06-08 DIAGNOSIS — I10 Essential (primary) hypertension: Secondary | ICD-10-CM | POA: Diagnosis present

## 2021-06-08 DIAGNOSIS — F0390 Unspecified dementia without behavioral disturbance: Secondary | ICD-10-CM | POA: Diagnosis present

## 2021-06-08 DIAGNOSIS — E782 Mixed hyperlipidemia: Secondary | ICD-10-CM | POA: Diagnosis present

## 2021-06-08 DIAGNOSIS — W19XXXA Unspecified fall, initial encounter: Secondary | ICD-10-CM

## 2021-06-08 DIAGNOSIS — I480 Paroxysmal atrial fibrillation: Secondary | ICD-10-CM | POA: Diagnosis present

## 2021-06-08 DIAGNOSIS — Z79899 Other long term (current) drug therapy: Secondary | ICD-10-CM | POA: Diagnosis not present

## 2021-06-08 LAB — COMPREHENSIVE METABOLIC PANEL
ALT: 12 U/L (ref 0–44)
AST: 20 U/L (ref 15–41)
Albumin: 3.5 g/dL (ref 3.5–5.0)
Alkaline Phosphatase: 86 U/L (ref 38–126)
Anion gap: 9 (ref 5–15)
BUN: 15 mg/dL (ref 8–23)
CO2: 24 mmol/L (ref 22–32)
Calcium: 9.2 mg/dL (ref 8.9–10.3)
Chloride: 103 mmol/L (ref 98–111)
Creatinine, Ser: 0.86 mg/dL (ref 0.44–1.00)
GFR, Estimated: >60 mL/min (ref >60)
Glucose, Bld: 133 mg/dL — ABNORMAL HIGH (ref 70–99)
Potassium: 4.5 mmol/L (ref 3.5–5.1)
Sodium: 136 mmol/L (ref 135–145)
Total Bilirubin: 1.4 mg/dL — ABNORMAL HIGH (ref 0.3–1.2)
Total Protein: 6.5 g/dL (ref 6.5–8.1)

## 2021-06-08 LAB — CBC WITH DIFFERENTIAL/PLATELET
Abs Immature Granulocytes: 0.21 10*3/uL — ABNORMAL HIGH (ref 0.00–0.07)
Basophils Absolute: 0.1 10*3/uL (ref 0.0–0.1)
Basophils Relative: 0 %
Eosinophils Absolute: 0 10*3/uL (ref 0.0–0.5)
Eosinophils Relative: 0 %
HCT: 36.5 % (ref 36.0–46.0)
Hemoglobin: 12.1 g/dL (ref 12.0–15.0)
Immature Granulocytes: 1 %
Lymphocytes Relative: 7 %
Lymphs Abs: 1.3 10*3/uL (ref 0.7–4.0)
MCH: 31.6 pg (ref 26.0–34.0)
MCHC: 33.2 g/dL (ref 30.0–36.0)
MCV: 95.3 fL (ref 80.0–100.0)
Monocytes Absolute: 1.1 10*3/uL — ABNORMAL HIGH (ref 0.1–1.0)
Monocytes Relative: 6 %
Neutro Abs: 15.9 10*3/uL — ABNORMAL HIGH (ref 1.7–7.7)
Neutrophils Relative %: 86 %
Platelets: 326 10*3/uL (ref 150–400)
RBC: 3.83 MIL/uL — ABNORMAL LOW (ref 3.87–5.11)
RDW: 12.6 % (ref 11.5–15.5)
WBC: 18.6 10*3/uL — ABNORMAL HIGH (ref 4.0–10.5)
nRBC: 0 % (ref 0.0–0.2)

## 2021-06-08 LAB — RESP PANEL BY RT-PCR (FLU A&B, COVID) ARPGX2
Influenza A by PCR: NEGATIVE
Influenza B by PCR: NEGATIVE
SARS Coronavirus 2 by RT PCR: NEGATIVE

## 2021-06-08 LAB — TYPE AND SCREEN
ABO/RH(D): A POS
Antibody Screen: NEGATIVE

## 2021-06-08 LAB — PROTIME-INR
INR: 1.1 (ref 0.8–1.2)
Prothrombin Time: 13.8 seconds (ref 11.4–15.2)

## 2021-06-08 MED ORDER — POLYETHYLENE GLYCOL 3350 17 G PO PACK: 17.0000 g | PACK | Freq: Every day | ORAL | Status: DC | PRN

## 2021-06-08 MED ORDER — ASPIRIN 81 MG PO CHEW
81.0000 mg | CHEWABLE_TABLET | Freq: Every day | ORAL | Status: DC
  Filled 2021-06-08: qty 1

## 2021-06-08 MED ORDER — CHLORHEXIDINE GLUCONATE 4 % EX LIQD: 60.0000 mL | Freq: Once | CUTANEOUS | Status: AC

## 2021-06-08 MED ORDER — METOPROLOL TARTRATE 25 MG PO TABS
12.5000 mg | ORAL_TABLET | Freq: Two times a day (BID) | ORAL | Status: DC
  Administered 2021-06-08 – 2021-06-11 (×6): 12.5 mg via ORAL
  Filled 2021-06-08 (×7): qty 1

## 2021-06-08 MED ORDER — RISPERIDONE 1 MG PO TBDP
1.0000 mg | ORAL_TABLET | Freq: Two times a day (BID) | ORAL | Status: DC
  Administered 2021-06-08 – 2021-06-11 (×6): 1 mg via ORAL
  Filled 2021-06-08 (×16): qty 1

## 2021-06-08 MED ORDER — ATORVASTATIN CALCIUM 10 MG PO TABS
10.0000 mg | ORAL_TABLET | Freq: Every day | ORAL | Status: DC
Start: 2021-06-08 — End: 2021-06-11
  Administered 2021-06-08 – 2021-06-11 (×4): 10 mg via ORAL
  Filled 2021-06-08 (×4): qty 1

## 2021-06-08 MED ORDER — LISINOPRIL 10 MG PO TABS
10.0000 mg | ORAL_TABLET | Freq: Every day | ORAL | Status: DC
  Administered 2021-06-09 – 2021-06-11 (×3): 10 mg via ORAL
  Filled 2021-06-08 (×3): qty 1

## 2021-06-08 MED ORDER — MORPHINE SULFATE (PF) 2 MG/ML IV SOLN: 2.0000 mg | INTRAVENOUS | Status: DC | PRN

## 2021-06-08 MED ORDER — SODIUM CHLORIDE 0.9 % IV SOLN: INTRAVENOUS | Status: DC

## 2021-06-08 MED ORDER — FLUTICASONE PROPIONATE 50 MCG/ACT NA SUSP
1.0000 | Freq: Every day | NASAL | Status: DC
  Administered 2021-06-09 – 2021-06-11 (×3): 1 via NASAL
  Filled 2021-06-08: qty 16

## 2021-06-08 NOTE — Progress Notes (Addendum)
Patient ID: Charlotte Lane, female   DOB: [birth year], 76 y.o.   MRN: [id number]  I HAVE SPOKE TO THE PX SON,   03/2020 [name] is a 76 y.o. female was brought to the hospital for evaluation of atrial fibrillation with rapid ventricular response after EMS went to her house for a wellness check.  Reportedly, patient was placed under IVC by community paramedic after they had discussed the case with her PCP, Dr. Earma Reading.  Apparently, she did not want to come to the hospital.   She was admitted to the hospital for A. fib with RVR.  She has converted to normal sinus rhythm.  There was some concern about starting long-term anticoagulation for stroke prophylaxis because of persistent confusion and inability to make decision for herself.  Therefore, she was started on low-dose aspirin.  This was discussed with her son, Marguerite Olea. She was also treated with Augmentin for dental caries.  She will need outpatient follow-up with a dentist for further management.  She has vitamin B12 deficiency (B12 level 136) and she has been started on vitamin B12 supplements.  It is suspected that patient has underlying dementia.  She was seen by the neurologist.  She was evaluated by the psychiatrist and she does not have capacity to make decision for herself.    Unfortunately, patient cannot be discharged back to her house for safety reasons.      MY RECOMMENDATIONS:  The procedure has been fully reviewed with the patient; The risks and benefits of surgery have been discussed and explained and understood. Alternative treatment has also been reviewed, questions were encouraged and answered. The postoperative plan is also been reviewed.  RIGHT PARTIAL HIP REPLACEMENT   CBC Latest Ref Rng & Units 06/08/2021 03/16/2020 03/09/2020  WBC 4.0 - 10.5 K/uL 18.6(H) 6.5 8.1  Hemoglobin 12.0 - 15.0 g/dL 12.1 13.0 13.1  Hematocrit 36.0 - 46.0 % 36.5 38.4 40.3  Platelets 150 - 400 K/uL 326 353 291   BMP Latest Ref Rng & Units  06/08/2021 03/16/2020 03/09/2020  Glucose 70 - 99 mg/dL 133(H) - 97  BUN 8 - 23 mg/dL 15 - 20  Creatinine 0.44 - 1.00 mg/dL 0.86 1.01(H) 1.13(H)  Sodium 135 - 145 mmol/L 136 - 139  Potassium 3.5 - 5.1 mmol/L 4.5 - 3.9  Chloride 98 - 111 mmol/L 103 - 104  CO2 22 - 32 mmol/L 24 - 27  Calcium 8.9 - 10.3 mg/dL 9.2 - 9.5

## 2021-06-08 NOTE — Progress Notes (Signed)
Patients son and daughter in-law are bedside and states at the facility [facility] pt was on a grounded texture food preparation. Family not sure if she was on thin or thickened liquids. Attempted to call [facility] with no answer. Nurse assigned made aware.

## 2021-06-08 NOTE — ED Notes (Signed)
[place]

## 2021-06-08 NOTE — Progress Notes (Signed)
Patients son and daughter in law informed this nurse the only visitors allowed are Awanda Mink - patients son   Illene Bolus - patients daughter in law Leeann Must - patients brother Ellwood Dense - patients sister in law.

## 2021-06-08 NOTE — ED Notes (Signed)
Multiple attempts to contact [facility] unsuccessful.  Left message with service to call ED.

## 2021-06-08 NOTE — Progress Notes (Signed)
Nurse received call from [facility] assisted living, per Isaiah Blakes, pt is on regular diet with thin liquids.

## 2021-06-08 NOTE — ED Notes (Signed)
Call to [facility] to notify of admission.  Spoke to [place], stated son aware of transfer to [facility] after fall, has not returned call to discuss admission.  [place], [phone]

## 2021-06-08 NOTE — ED Provider Notes (Addendum)
[facility] EMERGENCY DEPARTMENT Provider Note   CSN: [id number] Arrival date & time: 06/08/21  1036     History  Chief Complaint  Patient presents with   Fall    Right Hip pain    Charlotte Lane is a 75 y.o. female.  Patient brought in by EMS from [place] [facility] made then.  Patient with unwitnessed fall but reports that she was sitting on bed and slid to floor.  Patient states she remembers doing that.  Denies hitting her head.  Only complaint is right hip pain.  Only with movement.  Patient not on any blood thinners.  In addition the patient complains of no back pain no neck pain no other extremity pain.  Past medical history otherwise just significant for hypertension.      Home Medications Prior to Admission medications   Medication Sig Start Date End Date Taking? Authorizing Provider  aspirin EC 81 MG EC tablet Take 1 tablet (81 mg total) by mouth daily. Swallow whole. 03/17/20  Yes Jennye Boroughs, MD  atorvastatin (LIPITOR) 10 MG tablet Take 10 mg by mouth daily. 06/08/21  Yes [provider]  cholecalciferol (VITAMIN D) 25 MCG (1000 UNIT) tablet Take 1,000 Units by mouth daily.   Yes [provider]  fluticasone (FLONASE) 50 MCG/ACT nasal spray Place into both nostrils. 05/10/21  Yes [provider]  lisinopril (ZESTRIL) 10 MG tablet Take 10 mg by mouth daily. 06/08/21  Yes [provider]  metoprolol tartrate (LOPRESSOR) 25 MG tablet Take 0.5 tablets (12.5 mg total) by mouth 2 (two) times daily. 03/16/20  Yes Jennye Boroughs, MD  risperiDONE (RISPERDAL M-TABS) 1 MG disintegrating tablet Take 1 tablet (1 mg total) by mouth 2 (two) times daily. 03/16/20  Yes Jennye Boroughs, MD  vitamin B-12 1000 MCG tablet Take 1 tablet (1,000 mcg total) by mouth daily. 03/17/20  Yes Jennye Boroughs, MD      Allergies    Patient has no known allergies.    Review of Systems   Review of Systems  Constitutional:  Negative for chills and fever.  HENT:  Negative  for ear pain and sore throat.   Eyes:  Negative for pain and visual disturbance.  Respiratory:  Negative for cough and shortness of breath.   Cardiovascular:  Negative for chest pain and palpitations.  Gastrointestinal:  Negative for abdominal pain and vomiting.  Genitourinary:  Negative for dysuria and hematuria.  Musculoskeletal:  Negative for arthralgias, back pain and neck pain.  Skin:  Negative for color change and rash.  Neurological:  Negative for seizures, syncope, weakness, numbness and headaches.  All other systems reviewed and are negative.  Physical Exam Updated Vital Signs BP (!) 155/76 (BP Location: Right Arm)    Pulse 80    Temp 98.4 F (36.9 C) (Oral)    Resp (!) 21    Ht 1.549 m (5' 1" )    Wt 86.2 kg    SpO2 99%    BMI 35.90 kg/m  Physical Exam Vitals and nursing note reviewed.  Constitutional:      General: She is not in acute distress.    Appearance: Normal appearance. She is well-developed.  HENT:     Head: Normocephalic and atraumatic.  Eyes:     Extraocular Movements: Extraocular movements intact.     Conjunctiva/sclera: Conjunctivae normal.     Pupils: Pupils are equal, round, and reactive to light.  Cardiovascular:     Rate and Rhythm: Normal rate and regular rhythm.  Heart sounds: No murmur heard. Pulmonary:     Effort: Pulmonary effort is normal. No respiratory distress.     Breath sounds: Normal breath sounds.  Abdominal:     Palpations: Abdomen is soft.     Tenderness: There is no abdominal tenderness.  Musculoskeletal:        General: No swelling, tenderness or deformity.     Cervical back: Normal range of motion and neck supple.     Comments: No tenderness to palpation to the right hip.  But if she raises right leg there is pain in the right hip area.  No obvious deformity.  Distally neurovascularly intact.  Excellent movement of the left lower extremity.  And the right and left upper extremities.  No tenderness to palpation to the posterior  neck or to the thoracic or lumbar back area.  Skin:    General: Skin is warm and dry.     Capillary Refill: Capillary refill takes less than 2 seconds.  Neurological:     General: No focal deficit present.     Mental Status: She is alert. Mental status is at baseline.     Cranial Nerves: No cranial nerve deficit.     Sensory: No sensory deficit.     Motor: No weakness.  Psychiatric:        Mood and Affect: Mood normal.    ED Results / Procedures / Treatments   Labs (all labs ordered are listed, but only abnormal results are displayed) Labs Reviewed  COMPREHENSIVE METABOLIC PANEL - Abnormal; Notable for the following components:      Result Value   Glucose, Bld 133 (*)    Total Bilirubin 1.4 (*)    All other components within normal limits  CBC WITH DIFFERENTIAL/PLATELET - Abnormal; Notable for the following components:   WBC 18.6 (*)    RBC 3.83 (*)    Neutro Abs 15.9 (*)    Monocytes Absolute 1.1 (*)    Abs Immature Granulocytes 0.21 (*)    All other components within normal limits  RESP PANEL BY RT-PCR (FLU A&B, COVID) [id number]    EKG EKG Interpretation  Date/Time:  Tuesday June 08 2021 15:30:48 EST Ventricular Rate:  79 PR Interval:  189 QRS Duration: 84 QT Interval:  403 QTC Calculation: 462 R Axis:   20 Text Interpretation: Sinus rhythm Nonspecific T abnormalities, lateral leads Baseline wander in lead(s) V2 Confirmed by Fredia Sorrow [phone]) on 06/08/2021 3:33:27 PM  Radiology DG Chest Port 1 View  Result Date: 06/08/2021 CLINICAL DATA:  Un witnessed fall, right hip pain EXAM: PORTABLE CHEST 1 VIEW COMPARISON:  02/26/2020 FINDINGS: Single frontal view of the chest demonstrates an unremarkable cardiac silhouette. No acute airspace disease, effusion, or pneumothorax. No acute bony abnormalities. IMPRESSION: 1. No acute intrathoracic process. Electronically Signed   By: Randa Ngo M.D.   On: 06/08/2021 16:03   DG HIP UNILAT WITH PELVIS 2-3 VIEWS  RIGHT  Result Date: 06/08/2021 CLINICAL DATA:  Fall, RIGHT hip pain EXAM: DG HIP (WITH OR WITHOUT PELVIS) 2-3V RIGHT COMPARISON:  None. FINDINGS: Vertical fracture through the RIGHT femoral neck with varus angulation. Cranial migration of the distal fracture fragment. RIGHT hip is located. No pelvic fracture IMPRESSION: RIGHT femoral neck fracture. Electronically Signed   By: Suzy Bouchard M.D.   On: 06/08/2021 12:56    Procedures Procedures    Medications Ordered in ED Medications - No data to display  ED Course/ Medical Decision Making/ A&P  Medical Decision Making Amount and/or Complexity of Data Reviewed Labs: ordered. Radiology: ordered.  Risk Decision regarding hospitalization.   Clinically concern for right hip injury.  But there is no deformity to the leg on the right side intact neurovascularly distally.  No evidence of any other injuries.  We will get x-ray of the right hip and pelvis.  X-ray shows right femoral neck fracture.  Patient will require admission most likely medical admission orthopedic to fix.  Discussed with Dr. Aline Brochure on-call.  We will get admitting labs.  EKG without any acute changes.  Patient without any evidence of any head trauma.  Patient had unwitnessed fall so may be wise to get CT head prior to surgery.  Discussed with Dr. Aline Brochure.  From orthopedics.  He will consult.  Patient's labs are now back.  Complete metabolic panel normal other than a total bili of 1.4.  Alk phos is normal kidney function is normal.  CBC has a white count of 18,000.  Hemoglobin is 12.1.  Platelets are normal.  COVID influenza testing negative.  Chest x-ray no acute process.  EKG shows sinus rhythm.  Already discussed with Dr. Aline Brochure from orthopedics.  Will discuss with hospitalist for admission.  Have ordered CT head for when scanner comes back up.  Discussed with hospitalist they will admit.  Final Clinical Impression(s) / ED  Diagnoses Final diagnoses:  Fall, initial encounter  Right hip pain    Rx / DC Orders ED Discharge Orders     None         Fredia Sorrow, MD 06/08/21 1215    Fredia Sorrow, MD 06/08/21 1523    Fredia Sorrow, MD 06/08/21 1534    Fredia Sorrow, MD 06/08/21 1649    Fredia Sorrow, MD 06/08/21 1708

## 2021-06-08 NOTE — ED Notes (Signed)
Pt placed on bedpan

## 2021-06-08 NOTE — ED Notes (Signed)
Son notified of bed assignment to rm [id number]

## 2021-06-08 NOTE — Consult Note (Signed)
[facility]   Patient ID: Charlotte Lane, female   DOB: 19-Apr-1946, 76 y.o.   MRN: [id number]  New patient  Requested by: Dr. Shanon Brow Tat  Reason for: Right hip fracture  Based on the information below I recommend RIGHT PARTIAL HIP REPLACEMENT   Chief Complaint  Patient presents with   Fall    Right Hip pain     HPI this patient is not able to give a history due to dementia  76 YO FEMALE [facility], REPORTEDLY SLID OUT OF BED FRX RIGHT HIP  PAIN - Location RIGHT HIP  Duration 06/08/21 Severity SEVERE  Quality CNA  Modified by [place]   Review of Systems (all) Review of Systems  Unable to perform ROS: Acuity of condition    Past Medical History:  Diagnosis Date   Hypertension     Past Surgical History:  Procedure Laterality Date   CYST EXCISION Right 03/06/2019   Procedure: EXCISION SEBACEOUS CYST SCALP;  Surgeon: Aviva Signs, MD;  Location: [facility];  Service: General;  Laterality: Right;   NO PAST SURGERIES      Family History  Problem Relation Age of Onset   Heart attack Father    Social History   Tobacco Use   Smoking status: Never   Smokeless tobacco: Never  Vaping Use   Vaping Use: Never used  Substance Use Topics   Alcohol use: No   Drug use: No   No Known Allergies  Current Facility-Administered Medications:    0.9 %  sodium chloride infusion, , Intravenous, Continuous, Emokpae, Ejiroghene E, MD   [START ON 06/09/2021] aspirin chewable tablet 81 mg, 81 mg, Oral, Daily, Emokpae, Ejiroghene E, MD   atorvastatin (LIPITOR) tablet 10 mg, 10 mg, Oral, Daily, Emokpae, Ejiroghene E, MD   [START ON 06/09/2021] fluticasone (FLONASE) 50 MCG/ACT nasal spray 1 spray, 1 spray, Each Nare, Daily, Emokpae, Ejiroghene E, MD   [START ON 06/09/2021] lisinopril (ZESTRIL) tablet 10 mg, 10 mg, Oral, Daily, Emokpae, Ejiroghene E, MD   metoprolol tartrate (LOPRESSOR) tablet 12.5 mg, 12.5 mg, Oral, BID, Emokpae,  Ejiroghene E, MD   morphine (PF) 2 MG/ML injection 2 mg, 2 mg, Intravenous, Q4H PRN, Emokpae, Ejiroghene E, MD   polyethylene glycol (MIRALAX / GLYCOLAX) packet 17 g, 17 g, Oral, Daily PRN, Emokpae, Ejiroghene E, MD   risperiDONE (RISPERDAL M-TABS) disintegrating tablet 1 mg, 1 mg, Oral, BID, Emokpae, Ejiroghene E, MD    Physical Exam(=30) BP (!) 156/64 (BP Location: Right Arm)    Pulse 93    Temp 98.1 F (36.7 C) (Oral)    Resp 18    Ht 5\' 1"  (1.549 m)    Wt 86.2 kg    SpO2 96%    BMI 35.90 kg/m   Gen. Appearance relatively small frame lying in the bed curled Peripheral vascular system no evidence of disease no swelling or edema no skin changes color temperature normal Lymph nodes ARE NORMAL  Gait unable to ambulate secondary to fractured right hip  Left Upper extremity  Inspection revealed no malalignment or asymmetry  Assessment of range of motion: Full range of motion was recorded  Assessment of stability: Elbow wrist and hand and shoulder were stable  Assessment of muscle strength and tone revealed grade 5 muscle strength and normal muscle tone  Skin was normal without rash lesion or ulceration  Right upper extremity  Inspection revealed no malalignment or asymmetry  Assessment of range of  motion: Full range of motion was recorded  Assessment of stability: Elbow wrist and hand and shoulder were stable  Assessment of muscle strength and tone revealed grade 5 muscle strength and normal muscle tone  Skin was normal without rash lesion or ulceration  Right Lower extremity Skin is normal Inspection the limb is shortened and externally rotated and flexed at the hip Range of motion deferred because of the fracture Stability test deferred because of the fracture but we do have x-ray with the ball and socket Muscle tone is normal there is no tremors  Left lower extremity Inspection revealed no malalignment or asymmetry Assessment of range of motion: Full range of motion was  recorded Assessment of stability: Ankle, knee and hip were stable Assessment of muscle strength and tone revealed grade 5 muscle strength and normal muscle tone Skin was normal without rash lesion or ulceration  Coordination was tested by finger-to-nose nose and was normal Deep tendon reflexes were 2+ in the upper extremities and DEFERRED IN LE 2ND TO FRAX  Examination of sensation by touch was normal  Mental status  Oriented to time person and place normal  Mood and affect normal without depression anxiety or agitation  Dx:   Data Reviewed  ER RECORD REVIEWED: CONFIRMS HISTORY   I reviewed the following images and the reports and my independent interpretation is THERE IS A RIGHT FEMORAL NECK FRX DISPLACED    Assessment  76 YO FEMALE ADMITTED FROM N HOME. REPORTEDLY SLID OUT OF BED. H/O RAPID AFIB. RT HIP FRX.   Plan   PARTIAL RIGHT HIP REPLACEMENT    Carole Civil MD

## 2021-06-08 NOTE — H&P (View-Only) (Signed)
Patient ID: Charlotte Lane, female   DOB: 16-Jul-1945, 76 y.o.   MRN: [id number]  I HAVE SPOKE TO THE PX SON,   03/2020 Ms. Charlotte Lane is a 76 y.o. female was brought to the hospital for evaluation of atrial fibrillation with rapid ventricular response after EMS went to her house for a wellness check.  Reportedly, patient was placed under IVC by community paramedic after they had discussed the case with her PCP, Dr. Earma Reading.  Apparently, she did not want to come to the hospital.   She was admitted to the hospital for A. fib with RVR.  She has converted to normal sinus rhythm.  There was some concern about starting long-term anticoagulation for stroke prophylaxis because of persistent confusion and inability to make decision for herself.  Therefore, she was started on low-dose aspirin.  This was discussed with her son, Marguerite Olea. She was also treated with Augmentin for dental caries.  She will need outpatient follow-up with a dentist for further management.  She has vitamin B12 deficiency (B12 level 136) and she has been started on vitamin B12 supplements.  It is suspected that patient has underlying dementia.  She was seen by the neurologist.  She was evaluated by the psychiatrist and she does not have capacity to make decision for herself.    Unfortunately, patient cannot be discharged back to her house for safety reasons.      MY RECOMMENDATIONS:  The procedure has been fully reviewed with the patient; The risks and benefits of surgery have been discussed and explained and understood. Alternative treatment has also been reviewed, questions were encouraged and answered. The postoperative plan is also been reviewed.  RIGHT PARTIAL HIP REPLACEMENT   CBC Latest Ref Rng & Units 06/08/2021 03/16/2020 03/09/2020  WBC 4.0 - 10.5 K/uL 18.6(H) 6.5 8.1  Hemoglobin 12.0 - 15.0 g/dL 12.1 13.0 13.1  Hematocrit 36.0 - 46.0 % 36.5 38.4 40.3  Platelets 150 - 400 K/uL 326 353 291   BMP Latest Ref Rng & Units  06/08/2021 03/16/2020 03/09/2020  Glucose 70 - 99 mg/dL 133(H) - 97  BUN 8 - 23 mg/dL 15 - 20  Creatinine 0.44 - 1.00 mg/dL 0.86 1.01(H) 1.13(H)  Sodium 135 - 145 mmol/L 136 - 139  Potassium 3.5 - 5.1 mmol/L 4.5 - 3.9  Chloride 98 - 111 mmol/L 103 - 104  CO2 22 - 32 mmol/L 24 - 27  Calcium 8.9 - 10.3 mg/dL 9.2 - 9.5

## 2021-06-08 NOTE — ED Triage Notes (Signed)
Pt from [facility] in [place], unwitnessed fall, pt reports trying to sit on bed and slid to floor.  Pt complains of right hip pain, no visible injury or deformity.  Pt able to stand and pivot to ems stretcher.  Denies hitting head, no blood thinners.

## 2021-06-08 NOTE — ED Notes (Signed)
Patient transported to X-ray 

## 2021-06-08 NOTE — H&P (Addendum)
History and Physical    Charlotte Lane BLT:[id number] DOB: October 04, 1945 DOA: 06/08/2021  PCP: Bonnita Nasuti, MD   Patient coming from: [place]  I have personally briefly reviewed patient's old medical records in [place]  Chief Complaint: Fall  HPI: Charlotte Lane is a 76 y.o. female with medical history significant for dementia, hypertension, atrial fibrillation. Patient presented to the ED for reports of a fall.  Patient on arrival said she was trying to sit on the bed when she slid to the floor.  On my evaluation, patient has dementia, she is able to answer simple questions, she knows that she is in the hospital and she is able to tell me her name but she does know why she was brought to the ED.  She denies chest pain or difficulty breathing.  Denies weakness of her extremities.  She denies urinary symptoms.  ED Course: Blood pressure 130s to 170s.  Stable vitals.  WBC 18.6.  Chest x-ray clear.  Pelvic x-ray shows fracture of the right femoral neck. EDP talked to Dr. Aline Brochure, plan for surgery tomorrow.  Review of Systems: As per HPI all other systems reviewed and negative.  Past Medical History:  Diagnosis Date   Hypertension     Past Surgical History:  Procedure Laterality Date   CYST EXCISION Right 03/06/2019   Procedure: EXCISION SEBACEOUS CYST SCALP;  Surgeon: Aviva Signs, MD;  Location: [facility];  Service: General;  Laterality: Right;   NO PAST SURGERIES       reports that she has never smoked. She has never used smokeless tobacco. She reports that she does not drink alcohol and does not use drugs.  No Known Allergies  Family History  Problem Relation Age of Onset   Heart attack Father     Prior to Admission medications   Medication Sig Start Date End Date Taking? Authorizing Provider  aspirin EC 81 MG EC tablet Take 1 tablet (81 mg total) by mouth daily. Swallow whole. 03/17/20  Yes Jennye Boroughs, MD  atorvastatin (LIPITOR) 10 MG tablet Take  10 mg by mouth daily. 06/08/21  Yes [provider]  cholecalciferol (VITAMIN D) 25 MCG (1000 UNIT) tablet Take 1,000 Units by mouth daily.   Yes [provider]  fluticasone (FLONASE) 50 MCG/ACT nasal spray Place into both nostrils. 05/10/21  Yes [provider]  lisinopril (ZESTRIL) 10 MG tablet Take 10 mg by mouth daily. 06/08/21  Yes [provider]  metoprolol tartrate (LOPRESSOR) 25 MG tablet Take 0.5 tablets (12.5 mg total) by mouth 2 (two) times daily. 03/16/20  Yes Jennye Boroughs, MD  risperiDONE (RISPERDAL M-TABS) 1 MG disintegrating tablet Take 1 tablet (1 mg total) by mouth 2 (two) times daily. 03/16/20  Yes Jennye Boroughs, MD  vitamin B-12 1000 MCG tablet Take 1 tablet (1,000 mcg total) by mouth daily. 03/17/20  Yes Jennye Boroughs, MD    Physical Exam: Vitals:   06/08/21 1200 06/08/21 1330 06/08/21 1500 06/08/21 1600  BP: (!) 175/74 124/68 (!) 155/66 (!) 155/76  Pulse: (!) 58 60  80  Resp: 16 16 16  (!) 21  Temp:      TempSrc:      SpO2: 98% 99% 99% 99%  Weight:      Height:        Constitutional: NAD, calm, comfortable Vitals:   06/08/21 1200 06/08/21 1330 06/08/21 1500 06/08/21 1600  BP: (!) 175/74 124/68 (!) 155/66 (!) 155/76  Pulse: (!) 58 60  80  Resp: 16 16 16  (!) 21  Temp:      TempSrc:      SpO2: 98% 99% 99% 99%  Weight:      Height:       Eyes: PERRL, lids and conjunctivae normal ENMT: Mucous membranes are moist..  Neck: normal, supple, no masses, no thyromegaly Respiratory: clear to auscultation bilaterally, no wheezing, no crackles. Normal respiratory effort. No accessory muscle use.  Cardiovascular: Regular rate and rhythm, 2/6 systolic murmur, no extremity edema.  Lower extremities warm.   Abdomen: no tenderness, no masses palpated. No hepatosplenomegaly. Bowel sounds positive.  Musculoskeletal: no clubbing / cyanosis. No joint deformity upper and lower extremities. Good ROM, no contractures. Normal muscle tone.  Skin: no  rashes, lesions, ulcers. No induration Neurologic: No facial asymmetry.  Low-volume speech but clear.  Good bilateral hand-grip strength, +4/5 strength left lower extremity.  Right lower extremity not tested. Psychiatric: Awake, alert oriented to person and place.  Not situation or time.  Labs on Admission: I have personally reviewed following labs and imaging studies  CBC: Recent Labs  Lab 06/08/21 1539  WBC 18.6*  NEUTROABS 15.9*  HGB 12.1  HCT 36.5  MCV 95.3  PLT 119   Basic Metabolic Panel: Recent Labs  Lab 06/08/21 1539  NA 136  K 4.5  CL 103  CO2 24  GLUCOSE 133*  BUN 15  CREATININE 0.86  CALCIUM 9.2   GFR: Estimated Creatinine Clearance: 56.4 mL/min (by C-G formula based on SCr of 0.86 mg/dL). Liver Function Tests: Recent Labs  Lab 06/08/21 1539  AST 20  ALT 12  ALKPHOS 86  BILITOT 1.4*  PROT 6.5  ALBUMIN 3.5    Radiological Exams on Admission: DG Chest Port 1 View  Result Date: 06/08/2021 CLINICAL DATA:  Un witnessed fall, right hip pain EXAM: PORTABLE CHEST 1 VIEW COMPARISON:  02/26/2020 FINDINGS: Single frontal view of the chest demonstrates an unremarkable cardiac silhouette. No acute airspace disease, effusion, or pneumothorax. No acute bony abnormalities. IMPRESSION: 1. No acute intrathoracic process. Electronically Signed   By: Randa Ngo M.D.   On: 06/08/2021 16:03   DG HIP UNILAT WITH PELVIS 2-3 VIEWS RIGHT  Result Date: 06/08/2021 CLINICAL DATA:  Fall, RIGHT hip pain EXAM: DG HIP (WITH OR WITHOUT PELVIS) 2-3V RIGHT COMPARISON:  None. FINDINGS: Vertical fracture through the RIGHT femoral neck with varus angulation. Cranial migration of the distal fracture fragment. RIGHT hip is located. No pelvic fracture IMPRESSION: RIGHT femoral neck fracture. Electronically Signed   By: Suzy Bouchard M.D.   On: 06/08/2021 12:56    EKG: Independently reviewed.  Sinus rhythm rate 79.  QTc 462.  No significant change from prior.  Assessment/Plan Principal  Problem:   Closed displaced fracture of right femoral neck (HCC) Active Problems:   HTN (hypertension)   Paroxysmal atrial fibrillation with rapid ventricular response (HCC)   Dementia (HCC)   Mechanical fall with subsequent closed displaced fracture of the right femoral neck-head CT without acute abnormality.  Chest x-ray, EKG was without acute changes.. - EDP talked with Dr. Aline Brochure, plans for surgery tomorrow. -N.p.o. midnight -IV morphine 2 mg as needed - N/s 100cc/hr x 15hrs  Leukocytosis 18.6 no focus of infection identified at this time.  She denies urinary symptoms -Trend  Hypertension-elevated. -Resume lisinopril, metoprolol  Atrial fib-currently in sinus rhythm.  Diagnosed 03/2020, was not started on anticoagulation due to persistent confusion and inability to make decisions for herself. -Resume metoprolol  Dementia-appears to be at  baseline considering per our documentation. -Resume risperidone -Per family patient is on dysphagia diet, thickened liquids  DVT prophylaxis: SCDS Code Status:  Full Family Communication: Unable to reach Nursing home. Disposition Plan:  > 2 days Consults called: Ortho Admission status: Inpt med surg I certify that at the point of admission it is my clinical judgment that the patient will require inpatient hospital care spanning beyond 2 midnights from the point of admission due to high intensity of service, high risk for further deterioration and high frequency of surveillance required.   Bethena Roys MD [facility]  06/08/2021, 6:37 PM

## 2021-06-08 NOTE — ED Notes (Signed)
Pt found in sitting position on floor at end of bed, pt slid out of end of bed, bilat siderails noted to be up for safety.  Dr Rogene Houston made aware and examined pt. Pt returned to bed.  No pain with palpation, no decreased ROM to extremities.  VS WDL.  Son Marlou Sa made aware.

## 2021-06-09 ENCOUNTER — Encounter (HOSPITAL_COMMUNITY): Payer: Self-pay | Admitting: Internal Medicine

## 2021-06-09 ENCOUNTER — Other Ambulatory Visit: Payer: Self-pay

## 2021-06-09 ENCOUNTER — Inpatient Hospital Stay (HOSPITAL_COMMUNITY): Payer: Medicare Other

## 2021-06-09 ENCOUNTER — Inpatient Hospital Stay (HOSPITAL_COMMUNITY): Payer: Medicare Other | Admitting: Certified Registered"

## 2021-06-09 ENCOUNTER — Encounter (HOSPITAL_COMMUNITY)
Admission: EM | Disposition: A | Discharge status: Home Health | Payer: Self-pay | Source: Skilled Nursing Facility | Attending: Internal Medicine

## 2021-06-09 DIAGNOSIS — W19XXXD Unspecified fall, subsequent encounter: Secondary | ICD-10-CM

## 2021-06-09 DIAGNOSIS — I480 Paroxysmal atrial fibrillation: Secondary | ICD-10-CM

## 2021-06-09 DIAGNOSIS — M25551 Pain in right hip: Secondary | ICD-10-CM

## 2021-06-09 DIAGNOSIS — E782 Mixed hyperlipidemia: Secondary | ICD-10-CM

## 2021-06-09 DIAGNOSIS — F0390 Unspecified dementia without behavioral disturbance: Secondary | ICD-10-CM

## 2021-06-09 DIAGNOSIS — W19XXXA Unspecified fall, initial encounter: Secondary | ICD-10-CM

## 2021-06-09 HISTORY — PX: HIP ARTHROPLASTY: SHX981

## 2021-06-09 LAB — BASIC METABOLIC PANEL
Anion gap: 6 (ref 5–15)
BUN: 14 mg/dL (ref 8–23)
CO2: 25 mmol/L (ref 22–32)
Calcium: 9.1 mg/dL (ref 8.9–10.3)
Chloride: 108 mmol/L (ref 98–111)
Creatinine, Ser: 0.73 mg/dL (ref 0.44–1.00)
GFR, Estimated: >60 mL/min (ref >60)
Glucose, Bld: 125 mg/dL — ABNORMAL HIGH (ref 70–99)
Potassium: 3.7 mmol/L (ref 3.5–5.1)
Sodium: 139 mmol/L (ref 135–145)

## 2021-06-09 LAB — CBC
HCT: 35.1 % — ABNORMAL LOW (ref 36.0–46.0)
Hemoglobin: 11.7 g/dL — ABNORMAL LOW (ref 12.0–15.0)
MCH: 31 pg (ref 26.0–34.0)
MCHC: 33.3 g/dL (ref 30.0–36.0)
MCV: 93.1 fL (ref 80.0–100.0)
Platelets: 329 10*3/uL (ref 150–400)
RBC: 3.77 MIL/uL — ABNORMAL LOW (ref 3.87–5.11)
RDW: 12.6 % (ref 11.5–15.5)
WBC: 14.2 10*3/uL — ABNORMAL HIGH (ref 4.0–10.5)
nRBC: 0 % (ref 0.0–0.2)

## 2021-06-09 LAB — SURGICAL PCR SCREEN
MRSA, PCR: NEGATIVE
Staphylococcus aureus: NEGATIVE

## 2021-06-09 SURGERY — HEMIARTHROPLASTY, HIP, DIRECT ANTERIOR APPROACH, FOR FRACTURE
Anesthesia: Spinal | Site: Hip | Laterality: Right

## 2021-06-09 MED ORDER — ASPIRIN EC 325 MG PO TBEC
325.0000 mg | DELAYED_RELEASE_TABLET | Freq: Every day | ORAL | Status: DC
  Administered 2021-06-10 – 2021-06-11 (×2): 325 mg via ORAL
  Filled 2021-06-09 (×2): qty 1

## 2021-06-09 MED ORDER — PROPOFOL 10 MG/ML IV BOLUS
INTRAVENOUS | Status: AC
  Filled 2021-06-09: qty 20

## 2021-06-09 MED ORDER — METOCLOPRAMIDE HCL 10 MG PO TABS: 5.0000 mg | ORAL_TABLET | Freq: Three times a day (TID) | ORAL | Status: DC | PRN

## 2021-06-09 MED ORDER — ONDANSETRON HCL 4 MG/2ML IJ SOLN: 4.0000 mg | Freq: Four times a day (QID) | INTRAMUSCULAR | Status: DC | PRN

## 2021-06-09 MED ORDER — PHENOL 1.4 % MT LIQD: 1.0000 | OROMUCOSAL | Status: DC | PRN

## 2021-06-09 MED ORDER — ACETAMINOPHEN 325 MG PO TABS: 650.0000 mg | ORAL_TABLET | Freq: Four times a day (QID) | ORAL | Status: DC | PRN

## 2021-06-09 MED ORDER — METHOCARBAMOL 1000 MG/10ML IJ SOLN
500.0000 mg | Freq: Four times a day (QID) | INTRAVENOUS | Status: DC | PRN
  Filled 2021-06-09: qty 5

## 2021-06-09 MED ORDER — MORPHINE SULFATE (PF) 2 MG/ML IV SOLN: 0.5000 mg | INTRAVENOUS | Status: DC | PRN

## 2021-06-09 MED ORDER — PHENYLEPHRINE 40 MCG/ML (10ML) SYRINGE FOR IV PUSH (FOR BLOOD PRESSURE SUPPORT)
PREFILLED_SYRINGE | INTRAVENOUS | Status: AC
  Filled 2021-06-09: qty 10

## 2021-06-09 MED ORDER — CHLORHEXIDINE GLUCONATE 4 % EX LIQD
CUTANEOUS | Status: AC
  Administered 2021-06-09: 4 via TOPICAL
  Filled 2021-06-09: qty 15

## 2021-06-09 MED ORDER — METHOCARBAMOL 500 MG PO TABS: 500.0000 mg | ORAL_TABLET | Freq: Four times a day (QID) | ORAL | Status: DC | PRN

## 2021-06-09 MED ORDER — POVIDONE-IODINE 10 % EX SWAB
2.0000 "application " | Freq: Once | CUTANEOUS | Status: AC
  Administered 2021-06-09: 2 via TOPICAL

## 2021-06-09 MED ORDER — TRANEXAMIC ACID-NACL 1000-0.7 MG/100ML-% IV SOLN
1000.0000 mg | Freq: Once | INTRAVENOUS | Status: AC
  Administered 2021-06-09: 1000 mg via INTRAVENOUS
  Filled 2021-06-09: qty 100

## 2021-06-09 MED ORDER — CEFAZOLIN SODIUM-DEXTROSE 2-4 GM/100ML-% IV SOLN
INTRAVENOUS | Status: AC
  Filled 2021-06-09: qty 100

## 2021-06-09 MED ORDER — BUPIVACAINE-EPINEPHRINE (PF) 0.5% -1:200000 IJ SOLN
INTRAMUSCULAR | Status: DC | PRN
  Administered 2021-06-09: 30 mL

## 2021-06-09 MED ORDER — SODIUM CHLORIDE 0.9 % IV SOLN: INTRAVENOUS | Status: DC

## 2021-06-09 MED ORDER — CEFAZOLIN SODIUM-DEXTROSE 2-4 GM/100ML-% IV SOLN
2.0000 g | Freq: Four times a day (QID) | INTRAVENOUS | Status: AC
  Administered 2021-06-09 – 2021-06-10 (×2): 2 g via INTRAVENOUS
  Filled 2021-06-09 (×2): qty 100

## 2021-06-09 MED ORDER — PROPOFOL 500 MG/50ML IV EMUL
INTRAVENOUS | Status: DC | PRN
  Administered 2021-06-09: 40 ug/kg/min via INTRAVENOUS

## 2021-06-09 MED ORDER — LACTATED RINGERS IV SOLN: INTRAVENOUS | Status: DC

## 2021-06-09 MED ORDER — ACETAMINOPHEN 325 MG PO TABS: 325.0000 mg | ORAL_TABLET | Freq: Four times a day (QID) | ORAL | Status: DC | PRN

## 2021-06-09 MED ORDER — MENTHOL 3 MG MT LOZG: 1.0000 | LOZENGE | OROMUCOSAL | Status: DC | PRN

## 2021-06-09 MED ORDER — SODIUM CHLORIDE 0.9 % IR SOLN
Status: DC | PRN
  Administered 2021-06-09: 3000 mL

## 2021-06-09 MED ORDER — PHENYLEPHRINE HCL (PRESSORS) 10 MG/ML IV SOLN
INTRAVENOUS | Status: DC | PRN
  Administered 2021-06-09: 40 ug via INTRAVENOUS

## 2021-06-09 MED ORDER — ONDANSETRON HCL 4 MG/2ML IJ SOLN
INTRAMUSCULAR | Status: AC
  Filled 2021-06-09: qty 2

## 2021-06-09 MED ORDER — FENTANYL CITRATE (PF) 100 MCG/2ML IJ SOLN
INTRAMUSCULAR | Status: DC | PRN
  Administered 2021-06-09 (×2): 50 ug via INTRAVENOUS

## 2021-06-09 MED ORDER — HYDROCODONE-ACETAMINOPHEN 5-325 MG PO TABS
1.0000 | ORAL_TABLET | ORAL | Status: DC | PRN
  Administered 2021-06-10: 1 via ORAL
  Administered 2021-06-11: 2 via ORAL
  Filled 2021-06-09: qty 1
  Filled 2021-06-09: qty 2

## 2021-06-09 MED ORDER — SUGAMMADEX SODIUM 500 MG/5ML IV SOLN
INTRAVENOUS | Status: AC
  Filled 2021-06-09: qty 5

## 2021-06-09 MED ORDER — ACETAMINOPHEN 500 MG PO TABS
500.0000 mg | ORAL_TABLET | Freq: Four times a day (QID) | ORAL | Status: AC
  Administered 2021-06-09 – 2021-06-10 (×4): 500 mg via ORAL
  Filled 2021-06-09 (×4): qty 1

## 2021-06-09 MED ORDER — CHLORHEXIDINE GLUCONATE CLOTH 2 % EX PADS
6.0000 | MEDICATED_PAD | Freq: Every day | CUTANEOUS | Status: DC
  Administered 2021-06-10 – 2021-06-11 (×2): 6 via TOPICAL

## 2021-06-09 MED ORDER — 0.9 % SODIUM CHLORIDE (POUR BTL) OPTIME
TOPICAL | Status: DC | PRN
  Administered 2021-06-09: 1000 mL

## 2021-06-09 MED ORDER — BUPIVACAINE-EPINEPHRINE (PF) 0.5% -1:200000 IJ SOLN
INTRAMUSCULAR | Status: AC
  Filled 2021-06-09: qty 30

## 2021-06-09 MED ORDER — ONDANSETRON HCL 4 MG PO TABS: 4.0000 mg | ORAL_TABLET | Freq: Four times a day (QID) | ORAL | Status: DC | PRN

## 2021-06-09 MED ORDER — CHLORHEXIDINE GLUCONATE 0.12 % MT SOLN
15.0000 mL | Freq: Once | OROMUCOSAL | Status: AC
  Administered 2021-06-09: 15 mL via OROMUCOSAL
  Filled 2021-06-09: qty 15

## 2021-06-09 MED ORDER — DOCUSATE SODIUM 100 MG PO CAPS
100.0000 mg | ORAL_CAPSULE | Freq: Two times a day (BID) | ORAL | Status: DC
  Administered 2021-06-09 – 2021-06-11 (×4): 100 mg via ORAL
  Filled 2021-06-09 (×4): qty 1

## 2021-06-09 MED ORDER — METOCLOPRAMIDE HCL 5 MG/ML IJ SOLN: 5.0000 mg | Freq: Three times a day (TID) | INTRAMUSCULAR | Status: DC | PRN

## 2021-06-09 MED ORDER — PROPOFOL 10 MG/ML IV BOLUS
INTRAVENOUS | Status: DC | PRN
  Administered 2021-06-09: 60 mg via INTRAVENOUS

## 2021-06-09 MED ORDER — TRANEXAMIC ACID-NACL 1000-0.7 MG/100ML-% IV SOLN
INTRAVENOUS | Status: AC
  Filled 2021-06-09: qty 100

## 2021-06-09 MED ORDER — CEFAZOLIN SODIUM-DEXTROSE 2-4 GM/100ML-% IV SOLN
2.0000 g | INTRAVENOUS | Status: AC
  Administered 2021-06-09: 2 g via INTRAVENOUS

## 2021-06-09 MED ORDER — HYDROCODONE-ACETAMINOPHEN 7.5-325 MG PO TABS: 1.0000 | ORAL_TABLET | ORAL | Status: DC | PRN

## 2021-06-09 MED ORDER — ORAL CARE MOUTH RINSE: 15.0000 mL | Freq: Once | OROMUCOSAL | Status: AC

## 2021-06-09 MED ORDER — TRANEXAMIC ACID-NACL 1000-0.7 MG/100ML-% IV SOLN
1000.0000 mg | INTRAVENOUS | Status: AC
  Administered 2021-06-09: 1000 mg via INTRAVENOUS

## 2021-06-09 MED ORDER — ONDANSETRON HCL 4 MG/2ML IJ SOLN: 4.0000 mg | Freq: Once | INTRAMUSCULAR | Status: DC | PRN

## 2021-06-09 MED ORDER — FENTANYL CITRATE (PF) 100 MCG/2ML IJ SOLN
INTRAMUSCULAR | Status: AC
  Filled 2021-06-09: qty 2

## 2021-06-09 MED ORDER — BUPIVACAINE IN DEXTROSE 0.75-8.25 % IT SOLN
INTRATHECAL | Status: DC | PRN
  Administered 2021-06-09: 1.8 mL via INTRATHECAL

## 2021-06-09 MED ORDER — FENTANYL CITRATE PF 50 MCG/ML IJ SOSY: 25.0000 ug | PREFILLED_SYRINGE | INTRAMUSCULAR | Status: DC | PRN

## 2021-06-09 SURGICAL SUPPLY — 56 items
BALL HIP ARTICU 28 +5 (Hips) IMPLANT
BIPOLAR DEPUY 47 (Hips) ×2 IMPLANT
BIT DRILL 2.8X128 (BIT) ×2 IMPLANT
BLADE SAGITTAL 25.0X1.27X90 (BLADE) ×2 IMPLANT
BRUSH FEMORAL CANAL (MISCELLANEOUS) IMPLANT
CHLORAPREP W/TINT 26 (MISCELLANEOUS) ×2 IMPLANT
CLOTH BEACON ORANGE TIMEOUT ST (SAFETY) ×2 IMPLANT
COVER LIGHT HANDLE STERIS (MISCELLANEOUS) ×4 IMPLANT
DRAPE HIP W/POCKET STRL (MISCELLANEOUS) ×2 IMPLANT
DRAPE U-SHAPE 47X51 STRL (DRAPES) ×2 IMPLANT
DRSG MEPILEX BORDER 4X12 (GAUZE/BANDAGES/DRESSINGS) ×2 IMPLANT
DRSG MEPILEX SACRM 8.7X9.8 (GAUZE/BANDAGES/DRESSINGS) ×2 IMPLANT
ELECT REM PT RETURN 9FT ADLT (ELECTROSURGICAL) ×2
ELECTRODE REM PT RTRN 9FT ADLT (ELECTROSURGICAL) ×1 IMPLANT
GLOVE SS N UNI LF 8.5 STRL (GLOVE) ×2 IMPLANT
GLOVE SURG POLYISO LF SZ8 (GLOVE) ×4 IMPLANT
GLOVE SURG UNDER POLY LF SZ7 (GLOVE) ×6 IMPLANT
GOWN STRL REUS W/TWL LRG LVL3 (GOWN DISPOSABLE) ×4 IMPLANT
GOWN STRL REUS W/TWL XL LVL3 (GOWN DISPOSABLE) ×2 IMPLANT
HANDPIECE INTERPULSE COAX TIP (DISPOSABLE) ×1
HEAD BIPOLAR DEPUY 47 (Hips) IMPLANT
HIP BALL ARTICU 28 +5 (Hips) ×2 IMPLANT
INST SET MAJOR BONE (KITS) ×2 IMPLANT
KIT BLADEGUARD II DBL (SET/KITS/TRAYS/PACK) ×2 IMPLANT
KIT TURNOVER KIT A (KITS) ×2 IMPLANT
MANIFOLD NEPTUNE II (INSTRUMENTS) ×2 IMPLANT
MARKER SKIN DUAL TIP RULER LAB (MISCELLANEOUS) ×2 IMPLANT
NDL HYPO 18GX1.5 BLUNT FILL (NEEDLE) ×1 IMPLANT
NDL HYPO 21X1.5 SAFETY (NEEDLE) ×1 IMPLANT
NEEDLE HYPO 18GX1.5 BLUNT FILL (NEEDLE) ×2 IMPLANT
NEEDLE HYPO 21X1.5 SAFETY (NEEDLE) ×2 IMPLANT
NS IRRIG 1000ML POUR BTL (IV SOLUTION) ×2 IMPLANT
PACK TOTAL JOINT (CUSTOM PROCEDURE TRAY) ×2 IMPLANT
PAD ARMBOARD 7.5X6 YLW CONV (MISCELLANEOUS) ×2 IMPLANT
PASSER SUT SWANSON 36MM LOOP (INSTRUMENTS) IMPLANT
PIN STMN SNGL STERILE 9X3.6MM (PIN) ×4 IMPLANT
SET BASIN LINEN APH (SET/KITS/TRAYS/PACK) ×2 IMPLANT
SET HNDPC FAN SPRY TIP SCT (DISPOSABLE) ×1 IMPLANT
SPONGE T-LAP 18X18 ~~LOC~~+RFID (SPONGE) ×5 IMPLANT
STAPLER VISISTAT 35W (STAPLE) ×2 IMPLANT
STEM SUMMIT BASIC PRESSFIT SZ4 (Hips) ×1 IMPLANT
SUT BRALON NAB BRD #1 30IN (SUTURE) ×4 IMPLANT
SUT ETHIBOND 5 LR DA (SUTURE) ×3 IMPLANT
SUT MNCRL 0 VIOLET CTX 36 (SUTURE) ×1 IMPLANT
SUT MON AB 0 CT1 (SUTURE) ×1 IMPLANT
SUT MON AB 2-0 CT1 36 (SUTURE) ×2 IMPLANT
SUT MONOCRYL 0 CTX 36 (SUTURE) ×1
SUT VIC AB 1 CT1 27 (SUTURE) ×5
SUT VIC AB 1 CT1 27XBRD ANTBC (SUTURE) ×2 IMPLANT
SYR 20ML LL LF (SYRINGE) ×3 IMPLANT
SYR 30ML LL (SYRINGE) ×2 IMPLANT
SYR BULB IRRIG 60ML STRL (SYRINGE) ×2 IMPLANT
TOWER CARTRIDGE SMART MIX (DISPOSABLE) IMPLANT
TRAY FOLEY MTR SLVR 16FR STAT (SET/KITS/TRAYS/PACK) ×2 IMPLANT
WATER STERILE IRR 1000ML POUR (IV SOLUTION) ×4 IMPLANT
YANKAUER SUCT 12FT TUBE ARGYLE (SUCTIONS) ×2 IMPLANT

## 2021-06-09 NOTE — Anesthesia Procedure Notes (Signed)
Spinal  Patient location during procedure: OR Start time: 06/09/2021 12:50 PM End time: 06/09/2021 1:03 PM Reason for block: surgical anesthesia Staffing Performed: anesthesiologist and resident/CRNA  Anesthesiologist: Louann Sjogren, MD Resident/CRNA: Myna Bright, CRNA Preanesthetic Checklist Completed: patient identified, IV checked, site marked, risks and benefits discussed, surgical consent, monitors and equipment checked, pre-op evaluation and timeout performed Spinal Block Patient position: right lateral decubitus Prep: Betadine Patient monitoring: heart rate, cardiac monitor, continuous pulse ox and blood pressure Approach: midline Location: L3-4 Injection technique: single-shot Needle Needle type: Pencan  Needle gauge: 24 G Needle length: 10 cm Needle insertion depth: 7 cm Assessment Sensory level: T10 Events: CSF return and second provider

## 2021-06-09 NOTE — Assessment & Plan Note (Addendum)
Currently in sinus rhythm Personally reviewed EKG--sinus rhythm, nonspecific T wave change Continue aspirin and metoprolol tartrate Personally reviewed chest x-ray--no infiltrates or edema

## 2021-06-09 NOTE — Op Note (Signed)
06/09/2021  2:54 PM  PATIENT:  Charlotte Lane  76 y.o. female  PRE-OPERATIVE DIAGNOSIS:  right hip fracture  POST-OPERATIVE DIAGNOSIS:  right hip fracture  PROCEDURE:  Procedure(s) with comments: ARTHROPLASTY BIPOLAR HIP (HEMIARTHROPLASTY) (Right) - 12:30  SURGEON:  Surgeon(s) and Role:    Carole Civil, MD - Primary  Findings at surgery displaced complete femoral neck fracture mid cervical.  Acetabulum no arthritis  Implants  Stem: Press-fit for Summit basic  Outer head diameter: 47 mm  Inner head diameter 28 mm  Neck length +5  Approach direct lateral  Details of procedure  The patient was evaluated in the preop area the surgical site was confirmed by x-rays and imaging and the surgical site was marked.  The patient is demented and could not confirm the site.  The patient was taken the operating room she had a spinal anesthetic, insertion of Foley.  She was placed in lateral decubitus position with the right side up.  Axillary roll was placed.  The right lower extremity was prepped and draped from the waist to the toes.  The timeout was completed.  The incision was centered over the greater trochanter extended proximally and distally subcutaneous tissue was divided down to fascia fascia was split in line with the skin incision the greater trochanter bursa was excised the abductors were split in half and resected subperiosteally from the greater trochanter.  The minimus was split with the capsule divided and preserved with tagging sutures.  The head was removed from the neck and acetabulum and measured 47 mm  Acetabular is inspected after irrigation and there was no arthritis  With the leg dislocated anteriorly proximal femoral preparation was performed using a box osteotome starter hole reamer canal finder and trochanteric reamer.  The canal was broached up to a size 4 and a trial reduction was performed with a +1.5 and +5 neck length and a 47 outer head.  Preferred  the +5 neck length 47 head gave the best length restoration and range of motion.  Shuck test was performed flexion test with internal rotation, external rotation extension test sleep position testing was all performed.  The trial components were removed with the assistance of a bone hook  The greater trochanter was drilled and a #5 Ethibond suture was passed through it  Final irrigation was performed any leftover debris was removed.  The regular implants were placed.  The capsule and minimus were repaired with #1 Vicryl the gluteus medius was repaired with a #5 Ethibond suture along with oversewing using #1 Braylon  Marcaine with epinephrine was injected into the soft tissues of the abductors   The leg was then abducted and placed on a Mayo stand and #1 Surgilon was used to repair the fascia.  Final irrigation was performed and a 2 layer closure with 0 Monocryl was done and the skin was reapproximated with staples  PHYSICIAN ASSISTANT:   ASSISTANTS: sunny smith    ANESTHESIA:   spinal  EBL:  100 mL   BLOOD ADMINISTERED:none  DRAINS: none   LOCAL MEDICATIONS USED:  MARCAINE     SPECIMEN:  No Specimen  DISPOSITION OF SPECIMEN:  N/A  COUNTS:  YES  TOURNIQUET:  * No tourniquets in log *  DICTATION: .Dragon Dictation  PLAN OF CARE: Admit to inpatient   PATIENT DISPOSITION:  PACU - hemodynamically stable.   Delay start of Pharmacological VTE agent (>24hrs) due to surgical blood loss or risk of bleeding: yes

## 2021-06-09 NOTE — Hospital Course (Addendum)
76 year old female presents from [place] with a history of dementia, hypertension, atrial fibrillation, B12 deficiency presenting after an unwitnessed fall at her facility.  Apparently, the patient was trying to sit onto her bed and stated that she had slid onto the floor.  The patient denied any other trauma or hitting her head.  She had complained of some right hip pain only with movement.  She denies any headache or neck pain.  She is a poor historian secondary to her dementia.  She denies any headache, chest pain, shortness breath, nausea, vomit, diarrhea or abdominal pain.  In the ED, BMP showed sodium 136, potassium 4.5, serum creatinine 0.86.  LFTs were unremarkable.  WBC 18.6, hemoglobin 12.1, platelets 226,000.  X-ray of the right hip shows right femoral neck fracture.  Orthopedics was consulted.  The patient was admitted for further evaluation and treatment. She underwent right hemiarthroplasty on 06/09/21 by Dr. Aline Brochure.  Post-op PT recommended SNF, but given her major neurocognitive disorder and social circumstances, returning to [facility] with PT and RN was in patient's best interest.

## 2021-06-09 NOTE — Assessment & Plan Note (Addendum)
Orthopedics, Dr. Aline Brochure consulted 06/09/2021--Right hemiarthroplasty [facility] opioids PT /OT>>SNF Pt will return to [facility] with PT and RN ASA 325 mg x 1 month for DVT prophylaxis per ortho After one month, return to ASA 81 mg daily

## 2021-06-09 NOTE — Assessment & Plan Note (Signed)
Continue statin. 

## 2021-06-09 NOTE — Progress Notes (Signed)
Initial Nutrition Assessment  DOCUMENTATION CODES:      INTERVENTION:  RD will follow patient diet advancement and address nutrition intervention    NUTRITION DIAGNOSIS:   Inadequate oral intake related to inability to eat as evidenced by NPO status.   GOAL:  Patient will meet greater than or equal to 90% of their needs   MONITOR:  Diet advancement, Skin, Weight trends  REASON FOR ASSESSMENT:   Consult Assessment of nutrition requirement/status  ASSESSMENT: Patient is a 76 yo female with dementia, B-12 deficiency and HTN who fell at ALF.   Patient is NPO currently. Surgery today - hemiarthroplasty right hip. She is currently out of room.   Weights reviewed-reflect weight loss.  Medications reviewed and include: Lipitor, lopressor.   IVF-NS@ 50 ml/hr  Labs: BMP Latest Ref Rng & Units 06/09/2021 06/08/2021 03/16/2020  Glucose 70 - 99 mg/dL 125(H) 133(H) -  BUN 8 - 23 mg/dL 14 15 -  Creatinine 0.44 - 1.00 mg/dL 0.73 0.86 1.01(H)  Sodium 135 - 145 mmol/L 139 136 -  Potassium 3.5 - 5.1 mmol/L 3.7 4.5 -  Chloride 98 - 111 mmol/L 108 103 -  CO2 22 - 32 mmol/L 25 24 -  Calcium 8.9 - 10.3 mg/dL 9.1 9.2 -      NUTRITION - FOCUSED PHYSICAL EXAM: Unable to complete Nutrition-Focused physical exam at this time.     Diet Order:   Diet Order             Diet NPO time specified Except for: Sips with Meds  Diet effective midnight                   EDUCATION NEEDS:  Not appropriate for education at this time  Skin:  Skin Assessment: Skin Integrity Issues: Skin Integrity Issues:: Incisions Incisions: right hip  Last BM:  2/8 type 5  Height:   Ht Readings from Last 1 Encounters:  06/09/21 5\' 2"  (1.575 m)    Weight:   Wt Readings from Last 1 Encounters:  06/09/21 52.8 kg    Ideal Body Weight:   50 kg  BMI:  Body mass index is 21.29 kg/m.  Estimated Nutritional Needs:   Kcal:  1600-1800  Protein:  74-82 gr  Fluid:  1600 ml daily  Colman Cater  MS,RD,CSG,LDN Contact: Shea Evans

## 2021-06-09 NOTE — Transfer of Care (Signed)
Immediate Anesthesia Transfer of Care Note  Patient: Charlotte Lane  Procedure(s) Performed: ARTHROPLASTY BIPOLAR HIP (HEMIARTHROPLASTY) (Right: Hip)  Patient Location: PACU  Anesthesia Type:Spinal  Level of Consciousness: awake  Airway & Oxygen Therapy: Patient Spontanous Breathing  Post-op Assessment: Report given to RN  Post vital signs: Reviewed and stable  Last Vitals:  Vitals Value Taken Time  BP    Temp    Pulse    Resp    SpO2      Last Pain:  Vitals:   06/09/21 1144  TempSrc: Oral  PainSc:          Complications: No notable events documented.

## 2021-06-09 NOTE — Anesthesia Preprocedure Evaluation (Signed)
Anesthesia Evaluation  Patient identified by MRN, date of birth, ID band Patient awake    Reviewed: Allergy & Precautions, H&P , NPO status , Patient's Chart, lab work & pertinent test results, reviewed documented beta blocker date and time   Airway Mallampati: II  TM Distance: >3 FB Neck ROM: full    Dental no notable dental hx.    Pulmonary neg pulmonary ROS,    Pulmonary exam normal breath sounds clear to auscultation       Cardiovascular Exercise Tolerance: Good hypertension, negative cardio ROS   Rhythm:regular Rate:Normal     Neuro/Psych PSYCHIATRIC DISORDERS Dementia negative neurological ROS     GI/Hepatic negative GI ROS, Neg liver ROS,   Endo/Other  negative endocrine ROS  Renal/GU negative Renal ROS  negative genitourinary   Musculoskeletal   Abdominal   Peds  Hematology negative hematology ROS (+)   Anesthesia Other Findings   Reproductive/Obstetrics negative OB ROS                             Anesthesia Physical Anesthesia Plan  ASA: 3  Anesthesia Plan: Spinal   Post-op Pain Management:    Induction:   PONV Risk Score and Plan: Propofol infusion  Airway Management Planned:   Additional Equipment:   Intra-op Plan:   Post-operative Plan:   Informed Consent: I have reviewed the patients History and Physical, chart, labs and discussed the procedure including the risks, benefits and alternatives for the proposed anesthesia with the patient or authorized representative who has indicated his/her understanding and acceptance.     Dental Advisory Given  Plan Discussed with: CRNA  Anesthesia Plan Comments:         Anesthesia Quick Evaluation

## 2021-06-09 NOTE — Brief Op Note (Signed)
06/09/2021  2:54 PM  PATIENT:  Charlotte Lane  76 y.o. female  PRE-OPERATIVE DIAGNOSIS:  right hip fracture  POST-OPERATIVE DIAGNOSIS:  right hip fracture  PROCEDURE:  Procedure(s) with comments: ARTHROPLASTY BIPOLAR HIP (HEMIARTHROPLASTY) (Right) - 12:30  SURGEON:  Surgeon(s) and Role:    Carole Civil, MD - Primary  Findings at surgery displaced complete femoral neck fracture mid cervical.  Acetabulum no arthritis  Implants  Stem: Press-fit for Summit basic  Outer head diameter: 47 mm  Inner head diameter 28 mm  Neck length +5  Approach direct lateral  Details of procedure  The patient was evaluated in the preop area the surgical site was confirmed by x-rays and imaging and the surgical site was marked.  The patient is demented and could not confirm the site.  The patient was taken the operating room she had a spinal anesthetic, insertion of Foley.  She was placed in lateral decubitus position with the right side up.  Axillary roll was placed.  The right lower extremity was prepped and draped from the waist to the toes.  The timeout was completed.  The incision was centered over the greater trochanter extended proximally and distally subcutaneous tissue was divided down to fascia fascia was split in line with the skin incision the greater trochanter bursa was excised the abductors were split in half and resected subperiosteally from the greater trochanter.  The minimus was split with the capsule divided and preserved with tagging sutures.  The head was removed from the neck and acetabulum and measured 47 mm  Acetabular is inspected after irrigation and there was no arthritis  With the leg dislocated anteriorly proximal femoral preparation was performed using a box osteotome starter hole reamer canal finder and trochanteric reamer.  The canal was broached up to a size 4 and a trial reduction was performed with a +1.5 and +5 neck length and a 47 outer head.  Preferred  the +5 neck length 47 head gave the best length restoration and range of motion.  Shuck test was performed flexion test with internal rotation, external rotation extension test sleep position testing was all performed.  The trial components were removed with the assistance of a bone hook  The greater trochanter was drilled and a #5 Ethibond suture was passed through it  Final irrigation was performed any leftover debris was removed.  The regular implants were placed.  The capsule and minimus were repaired with #1 Vicryl the gluteus medius was repaired with a #5 Ethibond suture along with oversewing using #1 Braylon  Marcaine with epinephrine was injected into the soft tissues of the abductors   The leg was then abducted and placed on a Mayo stand and #1 Surgilon was used to repair the fascia.  Final irrigation was performed and a 2 layer closure with 0 Monocryl was done and the skin was reapproximated with staples  PHYSICIAN ASSISTANT:   ASSISTANTS: sunny smith    ANESTHESIA:   spinal  EBL:  100 mL   BLOOD ADMINISTERED:none  DRAINS: none   LOCAL MEDICATIONS USED:  MARCAINE     SPECIMEN:  No Specimen  DISPOSITION OF SPECIMEN:  N/A  COUNTS:  YES  TOURNIQUET:  * No tourniquets in log *  DICTATION: .Dragon Dictation  PLAN OF CARE: Admit to inpatient   PATIENT DISPOSITION:  PACU - hemodynamically stable.   Delay start of Pharmacological VTE agent (>24hrs) due to surgical blood loss or risk of bleeding: yes

## 2021-06-09 NOTE — Assessment & Plan Note (Signed)
Continue home dose of Risperdal Patient at risk for hospital delirium

## 2021-06-09 NOTE — Interval H&P Note (Signed)
History and Physical Interval Note:  06/09/2021 12:38 PM  Charlotte Lane  has presented today for surgery, with the diagnosis of right hip fracture.  The various methods of treatment have been discussed with the patient and family. After consideration of risks, benefits and other options for treatment, the patient has consented to  Procedure(s) with comments: ARTHROPLASTY BIPOLAR HIP (HEMIARTHROPLASTY) (Right) - 12:30 as a surgical intervention.  The patient's history has been reviewed, patient examined, no change in status, stable for surgery.  I have reviewed the patient's chart and labs.  Questions were answered to the patient's satisfaction.     Arther Abbott

## 2021-06-09 NOTE — Assessment & Plan Note (Addendum)
Continue lisinopril and metoprolol\ BP acceptable

## 2021-06-09 NOTE — Progress Notes (Signed)
°  Progress Note   Patient: Charlotte Lane XBM:[id number] DOB: November 28, 1945 DOA: 06/08/2021     1 DOS: the patient was seen and examined on 06/09/2021   Brief hospital course: 76 year old female presents from [place] with a history of dementia, hypertension, atrial fibrillation, B12 deficiency presenting after an unwitnessed fall at her facility.  Apparently, the patient was trying to sit onto her bed and stated that she had slid onto the floor.  The patient denied any other trauma or hitting her head.  She had complained of some right hip pain only with movement.  She denies any headache or neck pain.  She is a poor historian secondary to her dementia.  She denies any headache, chest pain, shortness breath, nausea, vomit, diarrhea or abdominal pain.  In the ED, BMP showed sodium 136, potassium 4.5, serum creatinine 0.86.  LFTs were unremarkable.  WBC 18.6, hemoglobin 12.1, platelets 226,000.  X-ray of the right hip shows right femoral neck fracture.  Orthopedics was consulted.  The patient was admitted for further evaluation and treatment.  Assessment and Plan: * Closed displaced fracture of right femoral neck ([place])- (present on admission) Orthopedics, Dr. Aline Brochure consulted Plan for operative intervention 06/09/2021 [facility] opioids PT /OT after operative intervention  Mixed hyperlipidemia Continue statin  Major neurocognitive disorder ([place]) Continue home dose of Risperdal Patient at risk for hospital delirium  Paroxysmal atrial fibrillation with rapid ventricular response ([place])- (present on admission) Currently in sinus rhythm Personally reviewed EKG--sinus rhythm, nonspecific T wave change Continue aspirin and metoprolol heart rate Personally reviewed chest x-ray--no infiltrates or edema  HTN (hypertension)- (present on admission) Continue lisinopril and metoprolol        Subjective: Patient denies fevers, chills, headache, chest pain, dyspnea, nausea, vomiting, diarrhea,  abdominal pain, dysuria, hematuria, hematochezia, and melena.   Physical Exam: Vitals:   06/08/21 1823 06/08/21 2155 06/08/21 2200 06/09/21 0514  BP: (!) 156/64  (!) 162/64 (!) 147/65  Pulse: 93 88 87 74  Resp: 18  20 18   Temp: 98.1 F (36.7 C)  (!) 97.3 F (36.3 C) 98.1 F (36.7 C)  TempSrc: Oral  Oral Oral  SpO2: 96%  100% 96%  Weight: 52.8 kg     Height: 5\' 2"  (1.575 m)      HEENT-[place]/AT, no thrush, no neck mass CV-RRR, no rub Lung-CTAB Abd-soft/NT+BS/ND Ext--No CCE General-A&O x1, NAD, well developed  Data Reviewed: Personally reviewed EKG--sinus rhythm, nonspecific T wave change Personally reviewed chest x-ray--no infiltrates or edema  Family Communication: no family at bedside  Disposition: Status is: Inpatient Remains inpatient appropriate because: operative fixation of fem neck fracture          Planned Discharge Destination: Skilled nursing facility     Time spent: 50 minutes  Author: Orson Eva, MD 06/09/2021 8:35 AM  For on call review [email or web address].

## 2021-06-10 DIAGNOSIS — E440 Moderate protein-calorie malnutrition: Secondary | ICD-10-CM | POA: Insufficient documentation

## 2021-06-10 DIAGNOSIS — I10 Essential (primary) hypertension: Secondary | ICD-10-CM

## 2021-06-10 DIAGNOSIS — E876 Hypokalemia: Secondary | ICD-10-CM

## 2021-06-10 LAB — BASIC METABOLIC PANEL
Anion gap: 8 (ref 5–15)
BUN: 13 mg/dL (ref 8–23)
CO2: 22 mmol/L (ref 22–32)
Calcium: 8.5 mg/dL — ABNORMAL LOW (ref 8.9–10.3)
Chloride: 106 mmol/L (ref 98–111)
Creatinine, Ser: 0.81 mg/dL (ref 0.44–1.00)
GFR, Estimated: >60 mL/min (ref >60)
Glucose, Bld: 114 mg/dL — ABNORMAL HIGH (ref 70–99)
Potassium: 3.2 mmol/L — ABNORMAL LOW (ref 3.5–5.1)
Sodium: 136 mmol/L (ref 135–145)

## 2021-06-10 LAB — CBC
HCT: 31.5 % — ABNORMAL LOW (ref 36.0–46.0)
Hemoglobin: 10.2 g/dL — ABNORMAL LOW (ref 12.0–15.0)
MCH: 30.2 pg (ref 26.0–34.0)
MCHC: 32.4 g/dL (ref 30.0–36.0)
MCV: 93.2 fL (ref 80.0–100.0)
Platelets: 265 10*3/uL (ref 150–400)
RBC: 3.38 MIL/uL — ABNORMAL LOW (ref 3.87–5.11)
RDW: 12.7 % (ref 11.5–15.5)
WBC: 14.2 10*3/uL — ABNORMAL HIGH (ref 4.0–10.5)
nRBC: 0 % (ref 0.0–0.2)

## 2021-06-10 LAB — GLUCOSE, CAPILLARY: Glucose-Capillary: 102 mg/dL — ABNORMAL HIGH (ref 70–99)

## 2021-06-10 MED ORDER — ENSURE ENLIVE PO LIQD
237.0000 mL | Freq: Three times a day (TID) | ORAL | Status: DC
  Administered 2021-06-10 – 2021-06-11 (×4): 237 mL via ORAL

## 2021-06-10 MED ORDER — ADULT MULTIVITAMIN W/MINERALS CH
1.0000 | ORAL_TABLET | Freq: Every day | ORAL | Status: DC
  Administered 2021-06-10 – 2021-06-11 (×2): 1 via ORAL
  Filled 2021-06-10 (×2): qty 1

## 2021-06-10 MED ORDER — POTASSIUM CHLORIDE CRYS ER 20 MEQ PO TBCR
40.0000 meq | EXTENDED_RELEASE_TABLET | Freq: Once | ORAL | Status: AC
  Administered 2021-06-10: 40 meq via ORAL
  Filled 2021-06-10: qty 2

## 2021-06-10 NOTE — TOC Initial Note (Addendum)
Transition of Care [facility]) - Initial/Assessment Note    Patient Details  Name: Charlotte Lane MRN: [id number] Date of Birth: Sep 19, 1945  [facility] CM/SW Contact:    Ihor Gully, LCSW Phone Number: 06/10/2021, 2:05 PM  Clinical Narrative:                 Patient from [facility] in memory care unit. Admitted for broken hip. Spoke with Juliann Pulse at [facility], reviewed PT eval, Juliann Pulse agreeable to patient returning to facility. Requests wc, HHRN/PT with [facility]. Spoke with son, Marlou Sa, regarding discharge plan. Marlou Sa prefers for patient to return to [place] with [facility] services.  Clarise Cruz with [facility] accepts referral for HHPT. Attending notified of [facility] PT and wc request. WC ordered via Caryl Pina at [facility].   Expected Discharge Plan: [place] Barriers to Discharge: Continued Medical Work up   Patient Goals and CMS Choice        Expected Discharge Plan and Services Expected Discharge Plan: [place]       Living arrangements for the past 2 months: [place]                                      Prior Living Arrangements/Services Living arrangements for the past 2 months: [place] Lives with:: Facility Resident                   Activities of Daily Living Home Assistive Devices/Equipment: None ADL Screening (condition at time of admission) Patient's cognitive ability adequate to safely complete daily activities?: No Is the patient deaf or have difficulty hearing?: No Does the patient have difficulty seeing, even when wearing glasses/contacts?: No Does the patient have difficulty concentrating, remembering, or making decisions?: Yes Patient able to express need for assistance with ADLs?: Yes Does the patient have difficulty dressing or bathing?: Yes Independently performs ADLs?: No Communication: Independent Dressing (OT): Needs assistance Is this a change from baseline?:  Pre-admission baseline Grooming: Needs assistance Is this a change from baseline?: Pre-admission baseline Feeding: Independent Bathing: Needs assistance Is this a change from baseline?: Pre-admission baseline Toileting: Needs assistance Is this a change from baseline?: Pre-admission baseline In/Out Bed: Needs assistance Is this a change from baseline?: Pre-admission baseline Walks in Home: Needs assistance Is this a change from baseline?: Pre-admission baseline Does the patient have difficulty walking or climbing stairs?: Yes Weakness of Legs: Right Weakness of Arms/Hands: None  Permission Sought/Granted                  Emotional Assessment       Orientation: : Oriented to Self, Oriented to Place, Oriented to Situation Alcohol / Substance Use: Not Applicable Psych Involvement: No (comment)  Admission diagnosis:  Fall [W19.XXXA] Right hip pain [M25.551] Fall, initial encounter [W19.XXXA] Closed displaced fracture of right femoral neck ([place]) [S72.001A] Patient Active Problem List   Diagnosis Date Noted   Mixed hyperlipidemia 06/09/2021   Fall    Right hip pain    Closed displaced fracture of right femoral neck ([place]) 06/08/2021   Major neurocognitive disorder ([place]) 02/27/2020   Paroxysmal atrial fibrillation with rapid ventricular response ([place]) 02/26/2020   Altered mental status 02/26/2020   Sebaceous cyst    HTN (hypertension) 06/25/2015   Chest pain with moderate risk for cardiac etiology 06/25/2015   Chest pain 01/23/2013   PCP:  Donnetta Hutching  P, MD Pharmacy:  No Pharmacies Listed    Social Determinants of Health (SDOH) Interventions    Readmission Risk Interventions No flowsheet data found.

## 2021-06-10 NOTE — Plan of Care (Signed)
°  Problem: Acute Rehab PT Goals(only PT should resolve) Goal: Pt Will Go Supine/Side To Sit Outcome: Progressing Flowsheets (Taken 06/10/2021 1440) Pt will go Supine/Side to Sit: with minimal assist Goal: Patient Will Transfer Sit To/From Stand Outcome: Progressing Flowsheets (Taken 06/10/2021 1440) Patient will transfer sit to/from stand: with minimal assist Goal: Pt Will Transfer Bed To Chair/Chair To Bed Outcome: Progressing Flowsheets (Taken 06/10/2021 1440) Pt will Transfer Bed to Chair/Chair to Bed: with min assist Goal: Pt Will Ambulate Outcome: Progressing Flowsheets (Taken 06/10/2021 1440) Pt will Ambulate:  25 feet  with moderate assist  with rolling walker   2:40 PM, 06/10/21 Lonell Grandchild, MPT Physical Therapist with [facility] 336 [phone] office [phone] mobile phone

## 2021-06-10 NOTE — Progress Notes (Signed)
PROGRESS NOTE  RUT BETTERTON WYO:[id number] DOB: 07-25-1945 DOA: 06/08/2021 PCP: Bonnita Nasuti, MD  Brief History:  76 year old female presents from [place] with a history of dementia, hypertension, atrial fibrillation, B12 deficiency presenting after an unwitnessed fall at her facility.  Apparently, the patient was trying to sit onto her bed and stated that she had slid onto the floor.  The patient denied any other trauma or hitting her head.  She had complained of some right hip pain only with movement.  She denies any headache or neck pain.  She is a poor historian secondary to her dementia.  She denies any headache, chest pain, shortness breath, nausea, vomit, diarrhea or abdominal pain.  In the ED, BMP showed sodium 136, potassium 4.5, serum creatinine 0.86.  LFTs were unremarkable.  WBC 18.6, hemoglobin 12.1, platelets 226,000.  X-ray of the right hip shows right femoral neck fracture.  Orthopedics was consulted.  The patient was admitted for further evaluation and treatment. She underwent right hemiarthroplasty on 06/09/21 by Dr. Aline Brochure.  Post-op PT recommended SNF, but given her major neurocognitive disorder and social circumstances, [facility] with [facility] and RN was in patient's best interest.     Assessment and Plan: * Closed displaced fracture of right femoral neck ([place])- (present on admission) Orthopedics, Dr. Aline Brochure consulted 06/09/2021--Right hemiarthroplasty Judicious opioids PT /OT>>SNF Pt will return to [facility] with PT and RN ASA 325 mg for DVT prophylaxis per ortho  Hypokalemia Replete  Fall Continue PT post op  Mixed hyperlipidemia Continue statin  Major neurocognitive disorder ([place]) Continue home dose of Risperdal Patient at risk for hospital delirium  Paroxysmal atrial fibrillation with rapid ventricular response ([place])- (present on admission) Currently in sinus rhythm Personally reviewed EKG--sinus rhythm, nonspecific T wave  change Continue aspirin and metoprolol tartrate Personally reviewed chest x-ray--no infiltrates or edema  HTN (hypertension)- (present on admission) Continue lisinopril and metoprolol\ BP acceptable         Status is: Inpatient Remains inpatient appropriate because: hip fracture requiring operative management.            Family Communication:   Family at bedside  Consultants:  Ortho--[place]  Code Status:  FULL  DVT Prophylaxis:  ASA per ortho   Procedures: As Listed in Progress Note Above  Antibiotics: None       Subjective: Patient denies fevers, chills, headache, chest pain, dyspnea, nausea, vomiting, diarrhea, abdominal pain,    Objective: Vitals:   06/09/21 1558 06/09/21 2054 06/10/21 0433 06/10/21 1416  BP: 136/62 (!) 129/50 (!) 157/63 (!) 148/66  Pulse: 72 83 79 76  Resp: 18 19 18 18   Temp: 97.6 F (36.4 C) 98 F (36.7 C) 98 F (36.7 C) 98.2 F (36.8 C)  TempSrc: Oral   Oral  SpO2: 100% 97% 95% 96%  Weight:      Height:        Intake/Output Summary (Last 24 hours) at 06/10/2021 1501 Last data filed at 06/10/2021 1200 Gross per 24 hour  Intake 1839.32 ml  Output 1475 ml  Net 364.32 ml   Weight change: -33.4 kg Exam:  General:  Pt is alert, follows commands appropriately, not in acute distress HEENT: No icterus, No thrush, No neck mass, [place]/AT Cardiovascular: RRR, S1/S2, no rubs, no gallops Respiratory: bibasilar crackles. No wheeze Abdomen: Soft/+BS, non tender, non distended, no guarding Extremities: No edema, No lymphangitis, No petechiae, No rashes, no synovitis   Data  Reviewed: I have personally reviewed following labs and imaging studies Basic Metabolic Panel: Recent Labs  Lab 06/08/21 1539 06/09/21 0509 06/10/21 0456  NA 136 139 136  K 4.5 3.7 3.2*  CL 103 108 106  CO2 24 25 22   GLUCOSE 133* 125* 114*  BUN 15 14 13   CREATININE 0.86 0.73 0.81  CALCIUM 9.2 9.1 8.5*   Liver Function Tests: Recent Labs  Lab  06/08/21 1539  AST 20  ALT 12  ALKPHOS 86  BILITOT 1.4*  PROT 6.5  ALBUMIN 3.5   No results for input(s): LIPASE, AMYLASE in the last 168 hours. No results for input(s): AMMONIA in the last 168 hours. Coagulation Profile: Recent Labs  Lab 06/08/21 2139  INR 1.1   CBC: Recent Labs  Lab 06/08/21 1539 06/09/21 0509 06/10/21 0456  WBC 18.6* 14.2* 14.2*  NEUTROABS 15.9*  --   --   HGB 12.1 11.7* 10.2*  HCT 36.5 35.1* 31.5*  MCV 95.3 93.1 93.2  PLT 326 329 265   Cardiac Enzymes: No results for input(s): CKTOTAL, CKMB, CKMBINDEX, TROPONINI in the last 168 hours. BNP: Invalid input(s): POCBNP CBG: No results for input(s): GLUCAP in the last 168 hours. HbA1C: No results for input(s): HGBA1C in the last 72 hours. Urine analysis:    Component Value Date/Time   COLORURINE YELLOW (A) 02/27/2020 1219   APPEARANCEUR CLOUDY (A) 02/27/2020 1219   LABSPEC 1.016 02/27/2020 1219   PHURINE 7.0 02/27/2020 1219   GLUCOSEU NEGATIVE 02/27/2020 1219   HGBUR NEGATIVE 02/27/2020 1219   BILIRUBINUR NEGATIVE 02/27/2020 1219   BILIRUBINUR negative 12/26/2019 0930   KETONESUR 5 (A) 02/27/2020 1219   PROTEINUR NEGATIVE 02/27/2020 1219   UROBILINOGEN 0.2 12/26/2019 0930   UROBILINOGEN 0.2 01/20/2013 1649   NITRITE NEGATIVE 02/27/2020 1219   LEUKOCYTESUR LARGE (A) 02/27/2020 1219   Sepsis Labs: @LABRCNTIP (procalcitonin:4,lacticidven:4) ) Recent Results (from the past 240 hour(s))  Resp Panel by RT-PCR (Flu A&B, Covid) Nasopharyngeal Swab     Status: None   Collection Time: 06/08/21  3:24 PM   Specimen: Nasopharyngeal Swab; Nasopharyngeal(NP) swabs in vial transport medium  Result Value Ref Range Status   SARS Coronavirus 2 by RT PCR NEGATIVE NEGATIVE Final    Comment: (NOTE) SARS-CoV-2 target nucleic acids are NOT DETECTED.  The SARS-CoV-2 RNA is generally detectable in upper respiratory specimens during the acute phase of infection. The lowest concentration of SARS-CoV-2 viral  copies this assay can detect is 138 copies/mL. A negative result does not preclude SARS-Cov-2 infection and should not be used as the sole basis for treatment or other patient management decisions. A negative result may occur with  improper specimen collection/handling, submission of specimen other than nasopharyngeal swab, presence of viral mutation(s) within the areas targeted by this assay, and inadequate number of viral copies(<138 copies/mL). A negative result must be combined with clinical observations, patient history, and epidemiological information. The expected result is Negative.  Fact Sheet for Patients:  EntrepreneurPulse.com.au  Fact Sheet for Healthcare Providers:  IncredibleEmployment.be  This test is no t yet approved or cleared by the [place] FDA and  has been authorized for detection and/or diagnosis of SARS-CoV-2 by FDA under an Emergency Use Authorization (EUA). This EUA will remain  in effect (meaning this test can be used) for the duration of the COVID-19 declaration under Section 564(b)(1) of the Act, 21 U.S.C.section 360bbb-3(b)(1), unless the authorization is terminated  or revoked sooner.       Influenza A by PCR NEGATIVE NEGATIVE Final  Influenza B by PCR NEGATIVE NEGATIVE Final    Comment: (NOTE) The Xpert Xpress SARS-CoV-2/FLU/RSV plus assay is intended as an aid in the diagnosis of influenza from Nasopharyngeal swab specimens and should not be used as a sole basis for treatment. Nasal washings and aspirates are unacceptable for Xpert Xpress SARS-CoV-2/FLU/RSV testing.  Fact Sheet for Patients: EntrepreneurPulse.com.au  Fact Sheet for Healthcare Providers: IncredibleEmployment.be  This test is not yet approved or cleared by the [place] FDA and has been authorized for detection and/or diagnosis of SARS-CoV-2 by FDA under an Emergency Use Authorization (EUA). This  EUA will remain in effect (meaning this test can be used) for the duration of the COVID-19 declaration under Section 564(b)(1) of the Act, 21 U.S.C. section 360bbb-3(b)(1), unless the authorization is terminated or revoked.  Performed at [facility], [address] [place], [place] [address]   Surgical pcr screen     Status: None   Collection Time: 06/09/21  1:25 AM   Specimen: Nasal Mucosa; Nasal Swab  Result Value Ref Range Status   MRSA, PCR NEGATIVE NEGATIVE Final   Staphylococcus aureus NEGATIVE NEGATIVE Final    Comment: (NOTE) The Xpert SA Assay (FDA approved for NASAL specimens in patients 30 years of age and older), is one component of a comprehensive surveillance program. It is not intended to diagnose infection nor to guide or monitor treatment. Performed at [facility], [address] [place], [place] [address]      Scheduled Meds:  aspirin EC  325 mg Oral Q breakfast   atorvastatin  10 mg Oral Daily   Chlorhexidine Gluconate Cloth  6 each Topical Daily   docusate sodium  100 mg Oral BID   feeding supplement  237 mL Oral TID BM   fluticasone  1 spray Each Nare Daily   lisinopril  10 mg Oral Daily   metoprolol tartrate  12.5 mg Oral BID   multivitamin with minerals  1 tablet Oral Daily   potassium chloride  40 mEq Oral Once   risperiDONE  1 mg Oral BID   Continuous Infusions:  methocarbamol (ROBAXIN) IV      Procedures/Studies: CT Head Wo Contrast  Result Date: 06/08/2021 CLINICAL DATA:  Un witnessed fall, head trauma EXAM: CT HEAD WITHOUT CONTRAST TECHNIQUE: Contiguous axial images were obtained from the base of the skull through the vertex without intravenous contrast. RADIATION DOSE REDUCTION: This exam was performed according to the departmental dose-optimization program which includes automated exposure control, adjustment of the mA and/or kV according to patient size and/or use of iterative reconstruction technique. COMPARISON:  02/26/2020 FINDINGS: Brain:  Stable scattered hypodensities throughout the periventricular white matter and bilateral basal ganglia consistent with chronic small vessel ischemic change. No evidence of acute infarct or hemorrhage. Lateral ventricles and remaining midline structures are unremarkable. No acute extra-axial fluid collections. No mass effect. Vascular: Stable atherosclerosis.  No hyperdense vessel. Skull: Normal. Negative for fracture or focal lesion. Sinuses/Orbits: Mild polypoid mucosal thickening right maxillary sinus. Remaining sinuses are clear. Other: None. IMPRESSION: 1. Stable chronic small-vessel ischemic changes. No acute intracranial process. Electronically Signed   By: Randa Ngo M.D.   On: 06/08/2021 17:40   DG Pelvis Portable  Result Date: 06/09/2021 CLINICAL DATA:  Right hip replacement EXAM: PORTABLE PELVIS 1-2 VIEWS COMPARISON:  None. FINDINGS: Postsurgical changes of right hip arthroplasty. Normal alignment without evidence of loosening or periprosthetic fracture. Expected soft tissue changes. IMPRESSION: Right hip arthroplasty without evidence of immediate hardware complication. Electronically Signed  By: Maurine Simmering M.D.   On: 06/09/2021 15:36   DG Chest Port 1 View  Result Date: 06/08/2021 CLINICAL DATA:  Un witnessed fall, right hip pain EXAM: PORTABLE CHEST 1 VIEW COMPARISON:  02/26/2020 FINDINGS: Single frontal view of the chest demonstrates an unremarkable cardiac silhouette. No acute airspace disease, effusion, or pneumothorax. No acute bony abnormalities. IMPRESSION: 1. No acute intrathoracic process. Electronically Signed   By: Randa Ngo M.D.   On: 06/08/2021 16:03   DG HIP UNILAT WITH PELVIS 2-3 VIEWS RIGHT  Result Date: 06/08/2021 CLINICAL DATA:  Fall, RIGHT hip pain EXAM: DG HIP (WITH OR WITHOUT PELVIS) 2-3V RIGHT COMPARISON:  None. FINDINGS: Vertical fracture through the RIGHT femoral neck with varus angulation. Cranial migration of the distal fracture fragment. RIGHT hip is located.  No pelvic fracture IMPRESSION: RIGHT femoral neck fracture. Electronically Signed   By: Suzy Bouchard M.D.   On: 06/08/2021 12:56    Orson Eva, DO  [facility]  If 7PM-7AM, please contact night-coverage [email or web address] Password [email or web address] 06/10/2021, 3:01 PM   LOS: 2 days

## 2021-06-10 NOTE — Progress Notes (Signed)
Resting comfortably and receiving scheduled tylenol . IV abx and IV fluids.  Wakes to take medicine but otherwise sleeps.  Ice pack applied to right hip and surgical dressing dry and intact.  Moderate pulse right foot with scds in place bilaterally.  Patient has flat affect

## 2021-06-10 NOTE — Assessment & Plan Note (Addendum)
Repleted K = 4.0 on day of discharge

## 2021-06-10 NOTE — Evaluation (Signed)
Physical Therapy Evaluation Patient Details Name: Charlotte Lane MRN: [id number] DOB: January 19, 1946 Today's Date: 06/10/2021  History of Present Illness  Charlotte Lane is a 76 y.o. female with medical history significant for dementia, hypertension, atrial fibrillation.  Patient presented to the ED for reports of a fall.  Patient on arrival said she was trying to sit on the bed when she slid to the floor.  On my evaluation, patient has dementia, she is able to answer simple questions, she knows that she is in the hospital and she is able to tell me her name but she does know why she was brought to the ED.  She denies chest pain or difficulty breathing.  Denies weakness of her extremities.  She denies urinary symptoms.   Clinical Impression  Patient demonstrates slow labored movement for sitting up at bedside with mild c/o pain right hip, very unsteady on feet with difficulty extending trunk due to weakness, incontinent of stool/urine, and limited to a few slow labored side steps before having to sit to avoid falling.  Patient tolerated sitting up in chair after therapy - nursing staff aware.  Patient will benefit from continued skilled physical therapy in hospital and recommended venue below to increase strength, balance, endurance for safe ADLs and gait.          Recommendations for follow up therapy are one component of a multi-disciplinary discharge planning process, led by the attending physician.  Recommendations may be updated based on patient status, additional functional criteria and insurance authorization.  Follow Up Recommendations Skilled nursing-short term rehab (<3 hours/day)    Assistance Recommended at Discharge Frequent or constant Supervision/Assistance  Patient can return home with the following  A lot of help with bathing/dressing/bathroom;A lot of help with walking and/or transfers;Assistance with cooking/housework;Help with stairs or ramp for entrance    Equipment  Recommendations Rolling walker (2 wheels)  Recommendations for Other Services       Functional Status Assessment Patient has had a recent decline in their functional status and demonstrates the ability to make significant improvements in function in a reasonable and predictable amount of time.     Precautions / Restrictions Precautions Precautions: Fall Restrictions Weight Bearing Restrictions: Yes RLE Weight Bearing: Weight bearing as tolerated      Mobility  Bed Mobility Overal bed mobility: Needs Assistance Bed Mobility: Supine to Sit     Supine to sit: Mod assist     General bed mobility comments: increased time, labored movement, increased right hip pain with movement    Transfers Overall transfer level: Needs assistance Equipment used: Rolling walker (2 wheels) Transfers: Sit to/from Stand, Bed to chair/wheelchair/BSC Sit to Stand: Mod assist   Step pivot transfers: Mod assist       General transfer comment: labored movement, had to lean over RW to maintain standing balance with frequent buckling of knees    Ambulation/Gait Ambulation/Gait assistance: Mod assist, Max assist Gait Distance (Feet): 5 Feet Assistive device: Rolling walker (2 wheels) Gait Pattern/deviations: Decreased step length - right, Decreased step length - left, Decreased stance time - right, Decreased stride length, Antalgic, Trunk flexed Gait velocity: decreased     General Gait Details: limited to a few slow labored unsteady side steps before having to sit due to BLE weakness/poor standing balance  Stairs            Wheelchair Mobility    Modified Rankin (Stroke Patients Only)       Balance Overall balance assessment: Needs assistance  Sitting-balance support: Feet supported, No upper extremity supported Sitting balance-Leahy Scale: Fair Sitting balance - Comments: fair/good seated at EOB   Standing balance support: Reliant on assistive device for balance, During  functional activity, Bilateral upper extremity supported Standing balance-Leahy Scale: Poor Standing balance comment: using RW                             Pertinent Vitals/Pain Pain Assessment Pain Assessment: 0-10 Pain Score: 8  Pain Location: right hip Pain Descriptors / Indicators: Sore, Grimacing Pain Intervention(s): Limited activity within patient's tolerance, Monitored during session, Premedicated before session, Repositioned    Home Living Family/patient expects to be discharged to:: Assisted living                        Prior Function Prior Level of Function : Needs assist       Physical Assist : Mobility (physical);ADLs (physical) Mobility (physical): Bed mobility;Transfers;Gait;Stairs   Mobility Comments: household ambulator without AD, "per patient" ADLs Comments: assisted ALF staff     Hand Dominance   Dominant Hand: Right    Extremity/Trunk Assessment   Upper Extremity Assessment Upper Extremity Assessment: Generalized weakness    Lower Extremity Assessment Lower Extremity Assessment: Generalized weakness;RLE deficits/detail RLE Deficits / Details: grossly -4/5 RLE: Unable to fully assess due to pain RLE Sensation: WNL RLE Coordination: WNL    Cervical / Trunk Assessment Cervical / Trunk Assessment: Normal  Communication   Communication: No difficulties  Cognition Arousal/Alertness: Awake/alert Behavior During Therapy: WFL for tasks assessed/performed Overall Cognitive Status: History of cognitive impairments - at baseline                                          General Comments      Exercises     Assessment/Plan    PT Assessment Patient needs continued PT services  PT Problem List Decreased strength;Decreased activity tolerance;Decreased balance;Decreased mobility       PT Treatment Interventions DME instruction;Gait training;Functional mobility training;Stair training;Therapeutic  activities;Therapeutic exercise;Balance training;Patient/family education    PT Goals (Current goals can be found in the Care Plan section)  Acute Rehab PT Goals Patient Stated Goal: return home with ALF staff to assist PT Goal Formulation: With patient Time For Goal Achievement: 06/24/21 Potential to Achieve Goals: Good    Frequency Min 3X/week     Co-evaluation               AM-PAC PT "6 Clicks" Mobility  Outcome Measure Help needed turning from your back to your side while in a flat bed without using bedrails?: A Lot Help needed moving from lying on your back to sitting on the side of a flat bed without using bedrails?: A Lot Help needed moving to and from a bed to a chair (including a wheelchair)?: A Lot Help needed standing up from a chair using your arms (e.g., wheelchair or bedside chair)?: A Lot Help needed to walk in hospital room?: A Lot Help needed climbing 3-5 steps with a railing? : Total 6 Click Score: 11    End of Session   Activity Tolerance: Patient tolerated treatment well;Patient limited by fatigue;Patient limited by pain Patient left: in chair;with call bell/phone within reach;with chair alarm set;with family/visitor present Nurse Communication: Mobility status PT Visit Diagnosis: Unsteadiness on feet (R26.81);Other abnormalities of  gait and mobility (R26.89);Muscle weakness (generalized) (M62.81)    Time: 1093-2355 PT Time Calculation (min) (ACUTE ONLY): 20 min   Charges:   PT Evaluation $PT Eval Moderate Complexity: 1 Mod PT Treatments $Therapeutic Activity: 8-22 mins        2:36 PM, 06/10/21 Lonell Grandchild, MPT Physical Therapist with [facility] [phone] office [phone] mobile phone  06/10/2021, 2:34 PM

## 2021-06-10 NOTE — Progress Notes (Signed)
Patient has mobility limitations that impair their ability to do one or more mobility related activities of daily living in customary locations in the home.  Patient cannot use crutches, cane or walker to resolve the issue sufficiently, patient can safely self-propel the wheelchair in the home or has a caregiver that can assist.    Charlotte Lane, Charlotte Pugh, LCSW

## 2021-06-10 NOTE — Anesthesia Postprocedure Evaluation (Signed)
Anesthesia Post Note  Patient: Charlotte Lane  Procedure(s) Performed: ARTHROPLASTY BIPOLAR HIP (HEMIARTHROPLASTY) (Right: Hip)  Patient location during evaluation: Phase II Anesthesia Type: Spinal Level of consciousness: awake Pain management: pain level controlled Vital Signs Assessment: post-procedure vital signs reviewed and stable Respiratory status: spontaneous breathing and respiratory function stable Cardiovascular status: blood pressure returned to baseline and stable Postop Assessment: no headache and no apparent nausea or vomiting Anesthetic complications: no Comments: Late entry   No notable events documented.   Last Vitals:  Vitals:   06/09/21 2054 06/10/21 0433  BP: (!) 129/50 (!) 157/63  Pulse: 83 79  Resp: 19 18  Temp: 36.7 C 36.7 C  SpO2: 97% 95%    Last Pain:  Vitals:   06/10/21 0736  TempSrc:   PainSc: [place]

## 2021-06-10 NOTE — Assessment & Plan Note (Signed)
Start nutritional supplements

## 2021-06-10 NOTE — Assessment & Plan Note (Signed)
Continue PT post op

## 2021-06-10 NOTE — Progress Notes (Signed)
Foley removed at 0600 and purwick put in place. New ice pack put in place and patient turned.  No c/o pain. Scheduled tylenol given.

## 2021-06-10 NOTE — Progress Notes (Signed)
Nutrition Follow-up  DOCUMENTATION CODES:  Non-severe (moderate) malnutrition in context of chronic illness  INTERVENTION:  Continue regular diet.  Add Ensure Plus High Protein po TID, each supplement provides 350 kcal and 20 grams of protein.   Add MVI with minerals daily.  Encourage PO and supplement intake.  NUTRITION DIAGNOSIS:  Moderate Malnutrition related to chronic illness (dementia) as evidenced by mild fat depletion, mild muscle depletion. - new diagnosis  GOAL:  Patient will meet greater than or equal to 90% of their needs. - progressing  MONITOR:  PO intake, Supplement acceptance, Labs, Weight trends, Skin, I & O's  REASON FOR ASSESSMENT:  Consult Assessment of nutrition requirement/status  ASSESSMENT:  Patient is a 76 yo female with dementia, B-12 deficiency, and HTN who fell at ALF.  Spoke with RN as pt was very tired from PT today and did not awaken to touch or voice.  RN reports that pt eats fair, about 50%, and recommends adding Ensure to benefit her intakes.  RD to order Ensure TID and MVI with minerals.  Medications: reviewed; colace BID, NaCl @ 100 ml/hr, Vicodin PO PRN (given once today)   Labs: reviewed; K 3.2 (L), Glucose 114 (H),   NUTRITION - FOCUSED PHYSICAL EXAM: Flowsheet Row Most Recent Value  Orbital Region Mild depletion  Upper Arm Region Mild depletion  Thoracic and Lumbar Region No depletion  Buccal Region Mild depletion  Temple Region Moderate depletion  Clavicle Bone Region Mild depletion  Clavicle and Acromion Bone Region Mild depletion  Scapular Bone Region Unable to assess  Dorsal Hand Mild depletion  Patellar Region Moderate depletion  Anterior Thigh Region Moderate depletion  Posterior Calf Region Moderate depletion  Edema (RD Assessment) None  Hair Reviewed  Eyes Unable to assess  Mouth Reviewed  Skin Reviewed  Nails Reviewed   Diet Order:   Diet Order             Diet regular Room service appropriate? Yes;  Fluid consistency: Thin  Diet effective now                  EDUCATION NEEDS:  No education needs have been identified at this time  Skin:  Skin Assessment: Skin Integrity Issues: Skin Integrity Issues:: Incisions Incisions: right hip  Last BM:  06/10/21  Height:  Ht Readings from Last 1 Encounters:  06/09/21 5\' 2"  (1.575 m)   Weight:  Wt Readings from Last 1 Encounters:  06/09/21 52.8 kg   BMI:  Body mass index is 21.29 kg/m.  Estimated Nutritional Needs:  Kcal:  1600-1800 Protein:  74-82 gr Fluid:  1600 ml daily  Derrel Nip, RD, LDN (she/her/hers) Clinical Inpatient Dietitian RD Pager/After-Hours/Weekend Pager # in [place]

## 2021-06-11 ENCOUNTER — Encounter (HOSPITAL_COMMUNITY): Payer: Self-pay | Admitting: Orthopedic Surgery

## 2021-06-11 DIAGNOSIS — E440 Moderate protein-calorie malnutrition: Secondary | ICD-10-CM

## 2021-06-11 LAB — BASIC METABOLIC PANEL
Anion gap: 7 (ref 5–15)
BUN: 15 mg/dL (ref 8–23)
CO2: 21 mmol/L — ABNORMAL LOW (ref 22–32)
Calcium: 8.6 mg/dL — ABNORMAL LOW (ref 8.9–10.3)
Chloride: 110 mmol/L (ref 98–111)
Creatinine, Ser: 0.71 mg/dL (ref 0.44–1.00)
GFR, Estimated: >60 mL/min (ref >60)
Glucose, Bld: 102 mg/dL — ABNORMAL HIGH (ref 70–99)
Potassium: 4 mmol/L (ref 3.5–5.1)
Sodium: 138 mmol/L (ref 135–145)

## 2021-06-11 LAB — CBC
HCT: 32.1 % — ABNORMAL LOW (ref 36.0–46.0)
Hemoglobin: 10.4 g/dL — ABNORMAL LOW (ref 12.0–15.0)
MCH: 31.4 pg (ref 26.0–34.0)
MCHC: 32.4 g/dL (ref 30.0–36.0)
MCV: 97 fL (ref 80.0–100.0)
Platelets: 232 10*3/uL (ref 150–400)
RBC: 3.31 MIL/uL — ABNORMAL LOW (ref 3.87–5.11)
RDW: 13.1 % (ref 11.5–15.5)
WBC: 14.6 10*3/uL — ABNORMAL HIGH (ref 4.0–10.5)
nRBC: 0 % (ref 0.0–0.2)

## 2021-06-11 MED ORDER — DOCUSATE SODIUM 100 MG PO CAPS: 100.0000 mg | ORAL_CAPSULE | Freq: Two times a day (BID) | ORAL | 0 refills | Status: AC

## 2021-06-11 MED ORDER — HYDROCODONE-ACETAMINOPHEN 5-325 MG PO TABS: 1.0000 | ORAL_TABLET | ORAL | 0 refills | Status: AC | PRN

## 2021-06-11 MED ORDER — ASPIRIN 325 MG PO TBEC: 325.0000 mg | DELAYED_RELEASE_TABLET | Freq: Every day | ORAL | 0 refills | Status: AC

## 2021-06-11 MED ORDER — ADULT MULTIVITAMIN W/MINERALS CH: 1.0000 | ORAL_TABLET | Freq: Every day | ORAL | Status: AC

## 2021-06-11 MED ORDER — ENSURE ENLIVE PO LIQD: 237.0000 mL | Freq: Three times a day (TID) | ORAL | 12 refills | Status: AC

## 2021-06-11 NOTE — NC FL2 (Signed)
[place] LEVEL OF CARE SCREENING TOOL     IDENTIFICATION  Patient Name: Charlotte Lane Birthdate: [birth year] Sex: female Admission Date (Current Location): 06/08/2021  [facility] and [place] Number:  [employer] and Address:  [place] [address], [place]      Provider Number: [id number]  Attending Physician Name and Address:  Orson Eva, MD  Relative Name and Phone Number:  Renold Don Marlou Sa) [facility])   [phone]    Current Level of Care: Hospital Recommended Level of Care: [place] Prior Approval Number:    Date Approved/Denied:   PASRR Number:    Discharge Plan: SNF    Current Diagnoses: Patient Active Problem List   Diagnosis Date Noted   Hypokalemia 06/10/2021   Malnutrition of moderate degree 06/10/2021   Mixed hyperlipidemia 06/09/2021   Fall    Right hip pain    Closed displaced fracture of right femoral neck ([place]) 06/08/2021   Major neurocognitive disorder ([place]) 02/27/2020   Paroxysmal atrial fibrillation with rapid ventricular response ([place]) 02/26/2020   Altered mental status 02/26/2020   Sebaceous cyst    HTN (hypertension) 06/25/2015   Chest pain with moderate risk for cardiac etiology 06/25/2015   Chest pain 01/23/2013    Orientation RESPIRATION BLADDER Height & Weight     Self, Situation, Place  Normal Incontinent Weight: 116 lb 6.5 oz (52.8 kg) Height:  5\' 2"  (157.5 cm)  BEHAVIORAL SYMPTOMS/MOOD NEUROLOGICAL BOWEL NUTRITION STATUS      Incontinent Diet (Regular)  AMBULATORY STATUS COMMUNICATION OF NEEDS Skin   Extensive Assist Verbally Surgical wounds (Right Hip)                       Personal Care Assistance Level of Assistance  Dressing, Feeding, Bathing, Total care Bathing Assistance: Maximum assistance Feeding assistance: Independent Dressing Assistance: Maximum assistance Total Care Assistance: Maximum assistance   Functional Limitations Info  Sight,  Hearing, Speech Sight Info: Adequate Hearing Info: Adequate Speech Info: Adequate    SPECIAL CARE FACTORS FREQUENCY  PT (By licensed PT), OT (By licensed OT)     PT Frequency: 5 times weekly OT Frequency: 5 times weekly            Contractures Contractures Info: Not present    Additional Factors Info  Code Status, Allergies Code Status Info: Full Code Allergies Info: NKA           Current Medications (06/11/2021):  This is the current hospital active medication list Current Facility-Administered Medications  Medication Dose Route Frequency Provider Last Rate Last Admin   acetaminophen (TYLENOL) tablet 325-650 mg  325-650 mg Oral Q6H PRN Carole Civil, MD       aspirin EC tablet 325 mg  325 mg Oral Q breakfast Carole Civil, MD   325 mg at 06/11/21 0930   atorvastatin (LIPITOR) tablet 10 mg  10 mg Oral Daily Carole Civil, MD   10 mg at 06/11/21 0932   Chlorhexidine Gluconate Cloth 2 % PADS 6 each  6 each Topical Daily Tat, David, MD   6 each at 06/11/21 0932   docusate sodium (COLACE) capsule 100 mg  100 mg Oral BID Carole Civil, MD   100 mg at 06/11/21 0931   feeding supplement (ENSURE ENLIVE / ENSURE PLUS) liquid 237 mL  237 mL Oral TID BM Orson Eva, MD   237 mL at 06/11/21 0932   fluticasone (FLONASE) 50 MCG/ACT nasal  spray 1 spray  1 spray Each Nare Daily Carole Civil, MD   1 spray at 06/11/21 0932   HYDROcodone-acetaminophen (NORCO) 7.5-325 MG per tablet 1-2 tablet  1-2 tablet Oral Q4H PRN Carole Civil, MD       HYDROcodone-acetaminophen (NORCO/VICODIN) 5-325 MG per tablet 1-2 tablet  1-2 tablet Oral Q4H PRN Carole Civil, MD   2 tablet at 06/11/21 0930   lisinopril (ZESTRIL) tablet 10 mg  10 mg Oral Daily Carole Civil, MD   10 mg at 06/11/21 0930   menthol-cetylpyridinium (CEPACOL) lozenge 3 mg  1 lozenge Oral PRN Carole Civil, MD       Or   phenol (CHLORASEPTIC) mouth spray 1 spray  1 spray Mouth/Throat PRN  Carole Civil, MD       methocarbamol (ROBAXIN) tablet 500 mg  500 mg Oral Q6H PRN Carole Civil, MD       Or   methocarbamol (ROBAXIN) 500 mg in dextrose 5 % 50 mL IVPB  500 mg Intravenous Q6H PRN Carole Civil, MD       metoCLOPramide (REGLAN) tablet 5-10 mg  5-10 mg Oral Q8H PRN Carole Civil, MD       Or   metoCLOPramide (REGLAN) injection 5-10 mg  5-10 mg Intravenous Q8H PRN Carole Civil, MD       metoprolol tartrate (LOPRESSOR) tablet 12.5 mg  12.5 mg Oral BID Carole Civil, MD   12.5 mg at 06/11/21 0931   morphine (PF) 2 MG/ML injection 0.5-1 mg  0.5-1 mg Intravenous Q2H PRN Carole Civil, MD       multivitamin with minerals tablet 1 tablet  1 tablet Oral Daily Tat, David, MD   1 tablet at 06/11/21 0931   ondansetron (ZOFRAN) tablet 4 mg  4 mg Oral Q6H PRN Carole Civil, MD       Or   ondansetron [facility]) injection 4 mg  4 mg Intravenous Q6H PRN Carole Civil, MD       risperiDONE (RISPERDAL M-TABS) disintegrating tablet 1 mg  1 mg Oral BID Carole Civil, MD   1 mg at 06/10/21 2150     Discharge Medications: Please see discharge summary for a list of discharge medications.  Relevant Imaging Results:  Relevant Lab Results:   Additional Information SS#: [id number]  Iona Beard, [place]

## 2021-06-11 NOTE — Discharge Summary (Signed)
Physician Discharge Summary   Patient: Charlotte Lane MRN: [id number] DOB: 1946/02/14  Admit date:     06/08/2021  Discharge date: 06/11/21  Discharge Physician: Shanon Brow Burnie Therien   PCP: Bonnita Nasuti, MD   Recommendations at discharge:   Please follow up with primary care provider within 1-2 weeks  Please repeat BMP and CBC in one week  Please follow up on/with Orthopedics, Dr. Arther Abbott 1-2 weeks   Discharge Diagnoses:   Hospital Course: 76 year old female presents from [facility] with a history of dementia, hypertension, atrial fibrillation, B12 deficiency presenting after an unwitnessed fall at her facility.  Apparently, the patient was trying to sit onto her bed and stated that she had slid onto the floor.  The patient denied any other trauma or hitting her head.  She had complained of some right hip pain only with movement.  She denies any headache or neck pain.  She is a poor historian secondary to her dementia.  She denies any headache, chest pain, shortness breath, nausea, vomit, diarrhea or abdominal pain.  In the ED, BMP showed sodium 136, potassium 4.5, serum creatinine 0.86.  LFTs were unremarkable.  WBC 18.6, hemoglobin 12.1, platelets 226,000.  X-ray of the right hip shows right femoral neck fracture.  Orthopedics was consulted.  The patient was admitted for further evaluation and treatment. She underwent right hemiarthroplasty on 06/09/21 by Dr. Aline Brochure.  Post-op PT recommended SNF, but given her major neurocognitive disorder and social circumstances, returning to [facility] with PT and RN was in patient's best interest.  Assessment and Plan: * Closed displaced fracture of right femoral neck ([place])- (present on admission) Orthopedics, Dr. Aline Brochure consulted 06/09/2021--Right hemiarthroplasty Judicious opioids PT /OT>>SNF Pt will return to [facility] with PT and RN ASA 325 mg x 1 month for DVT prophylaxis per ortho After one month, return to ASA 81 mg  daily  Malnutrition of moderate degree Start nutritional supplements  Hypokalemia Repleted K = 4.0 on day of discharge  Fall Continue PT post op  Mixed hyperlipidemia Continue statin  Major neurocognitive disorder ([place]) Continue home dose of Risperdal Patient at risk for hospital delirium  Paroxysmal atrial fibrillation with rapid ventricular response ([place])- (present on admission) Currently in sinus rhythm Personally reviewed EKG--sinus rhythm, nonspecific T wave change Continue aspirin and metoprolol tartrate Personally reviewed chest x-ray--no infiltrates or edema  HTN (hypertension)- (present on admission) Continue lisinopril and metoprolol\ BP acceptable         Pain control - [place] Controlled Substance Reporting System database was reviewed. and patient was instructed, not to drive, operate heavy machinery, perform activities at heights, swimming or participation in water activities or provide baby-sitting services while on Pain, Sleep and Anxiety Medications; until their outpatient Physician has advised to do so again. Also recommended to not to take more than prescribed Pain, Sleep and Anxiety Medications.   Consultants: Jen Mow Procedures performed: Right hemiarthroplasty  Disposition: Assisted living--[place] Diet recommendation: Heart Healthy Cardiac diet  DISCHARGE MEDICATION: Allergies as of 06/11/2021   No Known Allergies      Medication List     TAKE these medications    aspirin 325 MG EC tablet Take 1 tablet (325 mg total) by mouth daily with breakfast. X 30 days, then start aspirin 81 mg daily Start taking on: June 12, 2021 What changed:  medication strength how much to take when to take this additional instructions   atorvastatin 10 MG tablet Commonly known as: LIPITOR Take 10 mg  by mouth daily.   cholecalciferol 25 MCG (1000 UNIT) tablet Commonly known as: VITAMIN D Take 1,000 Units by mouth  daily.   cyanocobalamin 1000 MCG tablet Take 1 tablet (1,000 mcg total) by mouth daily.   docusate sodium 100 MG capsule Commonly known as: COLACE Take 1 capsule (100 mg total) by mouth 2 (two) times daily.   feeding supplement Liqd Take 237 mLs by mouth 3 (three) times daily between meals.   fluticasone 50 MCG/ACT nasal spray Commonly known as: FLONASE Place into both nostrils.   HYDROcodone-acetaminophen 5-325 MG tablet Commonly known as: NORCO/VICODIN Take 1 tablet by mouth every 4 (four) hours as needed for moderate pain.   lisinopril 10 MG tablet Commonly known as: ZESTRIL Take 10 mg by mouth daily.   metoprolol tartrate 25 MG tablet Commonly known as: LOPRESSOR Take 0.5 tablets (12.5 mg total) by mouth 2 (two) times daily.   multivitamin with minerals Tabs tablet Take 1 tablet by mouth daily. Start taking on: June 12, 2021   risperiDONE 1 MG disintegrating tablet Commonly known as: RISPERDAL M-TABS Take 1 tablet (1 mg total) by mouth 2 (two) times daily.               Durable Medical Equipment  (From admission, onward)           Start     Ordered   06/10/21 1454  For home use only DME lightweight manual wheelchair with seat cushion  Once       Comments: Patient suffers from right femoral neck fracture which impairs their ability to perform daily activities like dressing in the home.  A walker will not resolve  issue with performing activities of daily living. A wheelchair will allow patient to safely perform daily activities. Patient is not able to propel themselves in the home using a standard weight wheelchair due to general weakness. Patient can self propel in the lightweight wheelchair. Length of need 6 months . Accessories: elevating leg rests (ELRs), wheel locks, extensions and anti-tippers.   06/10/21 1454            Contact information for follow-up providers     Carole Civil, MD Follow up in 1 week(s).   Specialties:  Orthopedic Surgery, Radiology Why: Post-op follow up Contact information: [address] [place] [address] [place] information for after-discharge care     [facility] .   Service: Assisted Living Contact information: [address] [place] [phone]                     Discharge Exam: Danley Danker Weights   06/08/21 1050 06/08/21 1823 06/09/21 1144  Weight: 86.2 kg 52.8 kg 52.8 kg   HEENT:  [place]ane/AT, No thrush, no icterus CV:  RRR, no rub, no S3, no S4 Lung:  CTA, no wheeze, no rhonchi Abd:  soft/+BS, NT Ext:  No edema, no lymphangitis, no synovitis, no rash   Condition at discharge: stable  The results of significant diagnostics from this hospitalization (including imaging, microbiology, ancillary and laboratory) are listed below for reference.   Imaging Studies: CT Head Wo Contrast  Result Date: 06/08/2021 CLINICAL DATA:  Un witnessed fall, head trauma EXAM: CT HEAD WITHOUT CONTRAST TECHNIQUE: Contiguous axial images were obtained from the base of the skull through the vertex without intravenous contrast. RADIATION DOSE  REDUCTION: This exam was performed according to the departmental dose-optimization program which includes automated exposure control, adjustment of the mA and/or kV according to patient size and/or use of iterative reconstruction technique. COMPARISON:  02/26/2020 FINDINGS: Brain: Stable scattered hypodensities throughout the periventricular white matter and bilateral basal ganglia consistent with chronic small vessel ischemic change. No evidence of acute infarct or hemorrhage. Lateral ventricles and remaining midline structures are unremarkable. No acute extra-axial fluid collections. No mass effect. Vascular: Stable atherosclerosis.  No hyperdense vessel. Skull: Normal. Negative for fracture or focal lesion. Sinuses/Orbits: Mild polypoid mucosal  thickening right maxillary sinus. Remaining sinuses are clear. Other: None. IMPRESSION: 1. Stable chronic small-vessel ischemic changes. No acute intracranial process. Electronically Signed   By: Randa Ngo M.D.   On: 06/08/2021 17:40   DG Pelvis Portable  Result Date: 06/09/2021 CLINICAL DATA:  Right hip replacement EXAM: PORTABLE PELVIS 1-2 VIEWS COMPARISON:  None. FINDINGS: Postsurgical changes of right hip arthroplasty. Normal alignment without evidence of loosening or periprosthetic fracture. Expected soft tissue changes. IMPRESSION: Right hip arthroplasty without evidence of immediate hardware complication. Electronically Signed   By: Maurine Simmering M.D.   On: 06/09/2021 15:36   DG Chest Port 1 View  Result Date: 06/08/2021 CLINICAL DATA:  Un witnessed fall, right hip pain EXAM: PORTABLE CHEST 1 VIEW COMPARISON:  02/26/2020 FINDINGS: Single frontal view of the chest demonstrates an unremarkable cardiac silhouette. No acute airspace disease, effusion, or pneumothorax. No acute bony abnormalities. IMPRESSION: 1. No acute intrathoracic process. Electronically Signed   By: Randa Ngo M.D.   On: 06/08/2021 16:03   DG HIP UNILAT WITH PELVIS 2-3 VIEWS RIGHT  Result Date: 06/08/2021 CLINICAL DATA:  Fall, RIGHT hip pain EXAM: DG HIP (WITH OR WITHOUT PELVIS) 2-3V RIGHT COMPARISON:  None. FINDINGS: Vertical fracture through the RIGHT femoral neck with varus angulation. Cranial migration of the distal fracture fragment. RIGHT hip is located. No pelvic fracture IMPRESSION: RIGHT femoral neck fracture. Electronically Signed   By: Suzy Bouchard M.D.   On: 06/08/2021 12:56    Microbiology: Results for orders placed or performed during the hospital encounter of 06/08/21  Resp Panel by RT-PCR (Flu A&B, Covid) Nasopharyngeal Swab     Status: None   Collection Time: 06/08/21  3:24 PM   Specimen: Nasopharyngeal Swab; Nasopharyngeal(NP) swabs in vial transport medium  Result Value Ref Range Status   SARS  Coronavirus 2 by RT PCR NEGATIVE NEGATIVE Final    Comment: (NOTE) SARS-CoV-2 target nucleic acids are NOT DETECTED.  The SARS-CoV-2 RNA is generally detectable in upper respiratory specimens during the acute phase of infection. The lowest concentration of SARS-CoV-2 viral copies this assay can detect is 138 copies/mL. A negative result does not preclude SARS-Cov-2 infection and should not be used as the sole basis for treatment or other patient management decisions. A negative result may occur with  improper specimen collection/handling, submission of specimen other than nasopharyngeal swab, presence of viral mutation(s) within the areas targeted by this assay, and inadequate number of viral copies(<138 copies/mL). A negative result must be combined with clinical observations, patient history, and epidemiological information. The expected result is Negative.  Fact Sheet for Patients:  EntrepreneurPulse.com.au  Fact Sheet for Healthcare Providers:  IncredibleEmployment.be  This test is no t yet approved or cleared by the [place] FDA and  has been authorized for detection and/or diagnosis of SARS-CoV-2 by FDA under an Emergency Use Authorization (EUA). This EUA will remain  in effect (meaning this test can be  used) for the duration of the COVID-19 declaration under Section 564(b)(1) of the Act, 21 U.S.C.section 360bbb-3(b)(1), unless the authorization is terminated  or revoked sooner.       Influenza A by PCR NEGATIVE NEGATIVE Final   Influenza B by PCR NEGATIVE NEGATIVE Final    Comment: (NOTE) The Xpert Xpress SARS-CoV-2/FLU/RSV plus assay is intended as an aid in the diagnosis of influenza from Nasopharyngeal swab specimens and should not be used as a sole basis for treatment. Nasal washings and aspirates are unacceptable for Xpert Xpress SARS-CoV-2/FLU/RSV testing.  Fact Sheet for  Patients: EntrepreneurPulse.com.au  Fact Sheet for Healthcare Providers: IncredibleEmployment.be  This test is not yet approved or cleared by the [place] FDA and has been authorized for detection and/or diagnosis of SARS-CoV-2 by FDA under an Emergency Use Authorization (EUA). This EUA will remain in effect (meaning this test can be used) for the duration of the COVID-19 declaration under Section 564(b)(1) of the Act, 21 U.S.C. section 360bbb-3(b)(1), unless the authorization is terminated or revoked.  Performed at [facility], [address], [place], [place] [address]   Surgical pcr screen     Status: None   Collection Time: 06/09/21  1:25 AM   Specimen: Nasal Mucosa; Nasal Swab  Result Value Ref Range Status   MRSA, PCR NEGATIVE NEGATIVE Final   Staphylococcus aureus NEGATIVE NEGATIVE Final    Comment: (NOTE) The Xpert SA Assay (FDA approved for NASAL specimens in patients 21 years of age and older), is one component of a comprehensive surveillance program. It is not intended to diagnose infection nor to guide or monitor treatment. Performed at [facility], [address] [place], [place] [address]     Labs: CBC: Recent Labs  Lab 06/08/21 1539 06/09/21 0509 06/10/21 0456 06/11/21 0420  WBC 18.6* 14.2* 14.2* 14.6*  NEUTROABS 15.9*  --   --   --   HGB 12.1 11.7* 10.2* 10.4*  HCT 36.5 35.1* 31.5* 32.1*  MCV 95.3 93.1 93.2 97.0  PLT 326 329 265 867   Basic Metabolic Panel: Recent Labs  Lab 06/08/21 1539 06/09/21 0509 06/10/21 0456 06/11/21 0420  NA 136 139 136 138  K 4.5 3.7 3.2* 4.0  CL 103 108 106 110  CO2 24 25 22  21*  GLUCOSE 133* 125* 114* 102*  BUN 15 14 13 15   CREATININE 0.86 0.73 0.81 0.71  CALCIUM 9.2 9.1 8.5* 8.6*   Liver Function Tests: Recent Labs  Lab 06/08/21 1539  AST 20  ALT 12  ALKPHOS 86  BILITOT 1.4*  PROT 6.5  ALBUMIN 3.5   CBG: Recent Labs  Lab 06/10/21 1602  GLUCAP 102*     Discharge time spent: greater than 30 minutes.  Signed: Orson Eva, MD [facility] 06/11/2021

## 2021-06-11 NOTE — TOC Transition Note (Signed)
Transition of Care [facility]) - CM/SW Discharge Note   Patient Details  Name: Charlotte Lane MRN: [id number] Date of Birth: [birth year]  [facility]) CM/SW Contact:  Charlotte Lane, [place] Phone Number: 06/11/2021, 12:14 PM   Clinical Narrative:    CSW spoke with son about discharge plan. Son was thinking SNF for pt however, after thinking on it has decided he would like her to go back to ALF at [facility]. Updated Fl2 with med list sent to facility along with D/C summary. CSW spoke with Charlotte Lane at [facility] who states they can accept pt back. HH has been set up through [place], Judson Roch has been updated that pt will D/C to facility today. HH PT and RN orders are in. CSW to call for transport through [place] signing off.   Final next level of care: Assisted Living Barriers to Discharge: Barriers Resolved   Patient Goals and CMS Choice Patient states their goals for this hospitalization and ongoing recovery are:: Return to ALF CMS Medicare.gov Compare Post Acute Care list provided to:: Patient Choice offered to / list presented to : Patient  Discharge Placement              Patient chooses bed at: Other - please specify in the comment section below: [facility]) Patient to be transferred to facility by: [place] Name of family member notified: Charlotte Lane (son) Patient and family notified of of transfer: 06/11/21  Discharge Plan and Services                          HH Arranged: RN, PT          Social Determinants of Health (SDOH) Interventions     Readmission Risk Interventions No flowsheet data found.

## 2021-06-11 NOTE — NC FL2 (Signed)
[place] LEVEL OF CARE SCREENING TOOL     IDENTIFICATION  Patient Name: Charlotte Lane Birthdate: [birth year] Sex: female Admission Date (Current Location): 06/08/2021  [facility] and [place] Number:  [employer] and Address:  [place] [address] [place]      Provider Number: [id number]  Attending Physician Name and Address:  Orson Eva, MD  Relative Name and Phone Number:  Renold Don Marlou Sa) [facility])   [phone]    Current Level of Care: Hospital Recommended Level of Care: Assisted Living Facility Prior Approval Number:    Date Approved/Denied:   PASRR Number:    Discharge Plan: Other (Comment) ([place])    Current Diagnoses: Patient Active Problem List   Diagnosis Date Noted   Hypokalemia 06/10/2021   Malnutrition of moderate degree 06/10/2021   Mixed hyperlipidemia 06/09/2021   Fall    Right hip pain    Closed displaced fracture of right femoral neck ([place]) 06/08/2021   Major neurocognitive disorder ([place]) 02/27/2020   Paroxysmal atrial fibrillation with rapid ventricular response ([place]) 02/26/2020   Altered mental status 02/26/2020   Sebaceous cyst    HTN (hypertension) 06/25/2015   Chest pain with moderate risk for cardiac etiology 06/25/2015   Chest pain 01/23/2013    Orientation RESPIRATION BLADDER Height & Weight     Self, Situation, Place  Normal Incontinent Weight: 116 lb 6.5 oz (52.8 kg) Height:  5\' 2"  (157.5 cm)  BEHAVIORAL SYMPTOMS/MOOD NEUROLOGICAL BOWEL NUTRITION STATUS      Incontinent Diet (Regular)  AMBULATORY STATUS COMMUNICATION OF NEEDS Skin   Limited Assist Verbally Surgical wounds (Right Hip)                       Personal Care Assistance Level of Assistance  Dressing, Feeding, Bathing, Total care Bathing Assistance: Limited assistance Feeding assistance: Independent Dressing Assistance: Limited assistance Total Care Assistance: Maximum assistance    Functional Limitations Info  Sight, Hearing, Speech Sight Info: Adequate Hearing Info: Adequate Speech Info: Adequate    SPECIAL CARE FACTORS FREQUENCY  PT (By licensed PT)     PT Frequency: 3 times weekly OT Frequency: None            Contractures Contractures Info: Not present    Additional Factors Info  Code Status, Allergies Code Status Info: FULL Allergies Info: NKA           Current Medications (06/11/2021):  This is the current hospital active medication list Current Facility-Administered Medications  Medication Dose Route Frequency Provider Last Rate Last Admin   acetaminophen (TYLENOL) tablet 325-650 mg  325-650 mg Oral Q6H PRN Carole Civil, MD       aspirin EC tablet 325 mg  325 mg Oral Q breakfast Carole Civil, MD   325 mg at 06/11/21 0930   atorvastatin (LIPITOR) tablet 10 mg  10 mg Oral Daily Carole Civil, MD   10 mg at 06/11/21 8182   Chlorhexidine Gluconate Cloth 2 % PADS 6 each  6 each Topical Daily Tat, David, MD   6 each at 06/11/21 0932   docusate sodium (COLACE) capsule 100 mg  100 mg Oral BID Carole Civil, MD   100 mg at 06/11/21 0931   feeding supplement (ENSURE ENLIVE / ENSURE PLUS) liquid 237 mL  237 mL Oral TID BM Orson Eva, MD   237 mL at 06/11/21 0932   fluticasone (FLONASE) 50 MCG/ACT nasal spray 1 spray  1 spray Each Nare Daily Carole Civil, MD   1 spray at 06/11/21 0932   HYDROcodone-acetaminophen (NORCO) 7.5-325 MG per tablet 1-2 tablet  1-2 tablet Oral Q4H PRN Carole Civil, MD       HYDROcodone-acetaminophen (NORCO/VICODIN) 5-325 MG per tablet 1-2 tablet  1-2 tablet Oral Q4H PRN Carole Civil, MD   2 tablet at 06/11/21 0930   lisinopril (ZESTRIL) tablet 10 mg  10 mg Oral Daily Carole Civil, MD   10 mg at 06/11/21 0930   menthol-cetylpyridinium (CEPACOL) lozenge 3 mg  1 lozenge Oral PRN Carole Civil, MD       Or   phenol (CHLORASEPTIC) mouth spray 1 spray  1 spray Mouth/Throat  PRN Carole Civil, MD       methocarbamol (ROBAXIN) tablet 500 mg  500 mg Oral Q6H PRN Carole Civil, MD       Or   methocarbamol (ROBAXIN) 500 mg in dextrose 5 % 50 mL IVPB  500 mg Intravenous Q6H PRN Carole Civil, MD       metoCLOPramide (REGLAN) tablet 5-10 mg  5-10 mg Oral Q8H PRN Carole Civil, MD       Or   metoCLOPramide (REGLAN) injection 5-10 mg  5-10 mg Intravenous Q8H PRN Carole Civil, MD       metoprolol tartrate (LOPRESSOR) tablet 12.5 mg  12.5 mg Oral BID Carole Civil, MD   12.5 mg at 06/11/21 0931   morphine (PF) 2 MG/ML injection 0.5-1 mg  0.5-1 mg Intravenous Q2H PRN Carole Civil, MD       multivitamin with minerals tablet 1 tablet  1 tablet Oral Daily Tat, David, MD   1 tablet at 06/11/21 0931   ondansetron (ZOFRAN) tablet 4 mg  4 mg Oral Q6H PRN Carole Civil, MD       Or   ondansetron [facility]) injection 4 mg  4 mg Intravenous Q6H PRN Carole Civil, MD       risperiDONE (RISPERDAL M-TABS) disintegrating tablet 1 mg  1 mg Oral BID Carole Civil, MD   1 mg at 06/11/21 1100     Discharge Medications: TAKE these medications     aspirin 325 MG EC tablet Take 1 tablet (325 mg total) by mouth daily with breakfast. X 30 days, then start aspirin 81 mg daily Start taking on: June 12, 2021 What changed:  medication strength how much to take when to take this additional instructions    atorvastatin 10 MG tablet Commonly known as: LIPITOR Take 10 mg by mouth daily.    cholecalciferol 25 MCG (1000 UNIT) tablet Commonly known as: VITAMIN D Take 1,000 Units by mouth daily.    cyanocobalamin 1000 MCG tablet Take 1 tablet (1,000 mcg total) by mouth daily.    docusate sodium 100 MG capsule Commonly known as: COLACE Take 1 capsule (100 mg total) by mouth 2 (two) times daily.    feeding supplement Liqd Take 237 mLs by mouth 3 (three) times daily between meals.    fluticasone 50 MCG/ACT nasal  spray Commonly known as: FLONASE Place into both nostrils.    HYDROcodone-acetaminophen 5-325 MG tablet Commonly known as: NORCO/VICODIN Take 1 tablet by mouth every 4 (four) hours as needed for moderate pain.    lisinopril 10 MG tablet Commonly known as: ZESTRIL Take 10 mg by mouth daily.    metoprolol tartrate 25 MG tablet Commonly known as: LOPRESSOR Take 0.5 tablets (12.5 mg  total) by mouth 2 (two) times daily.    multivitamin with minerals Tabs tablet Take 1 tablet by mouth daily. Start taking on: June 12, 2021    risperiDONE 1 MG disintegrating tablet Commonly known as: RISPERDAL M-TABS Take 1 tablet (1 mg total) by mouth 2 (two) times daily.      Relevant Imaging Results:  Relevant Lab Results:   Additional Information SS#: [id number]  Iona Beard, [place]

## 2021-06-11 NOTE — Progress Notes (Signed)
Physical Therapy Treatment Patient Details Name: Charlotte Lane MRN: [id number] DOB: Jan 03, 1946 Today's Date: 06/11/2021   History of Present Illness Charlotte Lane is a 76 y.o. female with medical history significant for dementia, hypertension, atrial fibrillation.  Patient presented to the ED for reports of a fall.  Patient on arrival said she was trying to sit on the bed when she slid to the floor.  On my evaluation, patient has dementia, she is able to answer simple questions, she knows that she is in the hospital and she is able to tell me her name but she does know why she was brought to the ED.  She denies chest pain or difficulty breathing.  Denies weakness of her extremities.  She denies urinary symptoms.    PT Comments    Patient demonstrates slow labored movement for sitting up at bedside with poor carryover for using BUE to help scoot self to EOB, once seated frequently leaning backwards mostly due to lethargy after taking pain medication and required active assistance for completing BLE exercises. Patient limited to a few slow labored unsteady side steps before having to sit due poor standing balance/fatigue.  Patient tolerated sitting up in chair after therapy - nursing staff notified.  Patient will benefit from continued skilled physical therapy in hospital and recommended venue below to increase strength, balance, endurance for safe ADLs and gait.      Recommendations for follow up therapy are one component of a multi-disciplinary discharge planning process, led by the attending physician.  Recommendations may be updated based on patient status, additional functional criteria and insurance authorization.  Follow Up Recommendations  Skilled nursing-short term rehab (<3 hours/day)     Assistance Recommended at Discharge Frequent or constant Supervision/Assistance  Patient can return home with the following A lot of help with bathing/dressing/bathroom;A lot of help with walking  and/or transfers;Assistance with cooking/housework;Help with stairs or ramp for entrance   Equipment Recommendations  Rolling walker (2 wheels)    Recommendations for Other Services       Precautions / Restrictions Precautions Precautions: Fall Restrictions Weight Bearing Restrictions: Yes RLE Weight Bearing: Weight bearing as tolerated     Mobility  Bed Mobility Overal bed mobility: Needs Assistance Bed Mobility: Supine to Sit     Supine to sit: Mod assist     General bed mobility comments: slow labored movement with frequent verbal/tactile cueing to follow directions    Transfers Overall transfer level: Needs assistance Equipment used: Rolling walker (2 wheels) Transfers: Sit to/from Stand, Bed to chair/wheelchair/BSC Sit to Stand: Mod assist   Step pivot transfers: Mod assist       General transfer comment: very unsteady labored movement    Ambulation/Gait Ambulation/Gait assistance: Mod assist, Max assist Gait Distance (Feet): 5 Feet Assistive device: Rolling walker (2 wheels) Gait Pattern/deviations: Decreased step length - right, Decreased step length - left, Decreased stance time - right, Decreased stride length, Antalgic, Trunk flexed Gait velocity: decreased     General Gait Details: limited to a few unsteady labored side steps with frequent buckling of RLE due to weakness and increased pain with weighbearing   Stairs             Wheelchair Mobility    Modified Rankin (Stroke Patients Only)       Balance Overall balance assessment: Needs assistance Sitting-balance support: Feet supported, No upper extremity supported Sitting balance-Leahy Scale: Fair Sitting balance - Comments: fair/poor with frequent leaning backwards Postural control: Posterior lean Standing balance  support: Reliant on assistive device for balance, During functional activity, Bilateral upper extremity supported Standing balance-Leahy Scale: Poor Standing balance  comment: using RW                            Cognition Arousal/Alertness: Awake/alert Behavior During Therapy: WFL for tasks assessed/performed Overall Cognitive Status: History of cognitive impairments - at baseline                                          Exercises General Exercises - Lower Extremity Long Arc Quad: Seated, AAROM, Strengthening, Both, 10 reps Hip Flexion/Marching: Seated, AAROM, Strengthening, Both, 10 reps    General Comments        Pertinent Vitals/Pain Pain Assessment Pain Assessment: Faces Faces Pain Scale: Hurts little more Pain Location: right hip Pain Descriptors / Indicators: Sore, Grimacing Pain Intervention(s): Limited activity within patient's tolerance, Monitored during session, Premedicated before session, Repositioned    Home Living                          Prior Function            PT Goals (current goals can now be found in the care plan section) Acute Rehab PT Goals Patient Stated Goal: return home with ALF staff to assist PT Goal Formulation: With patient Time For Goal Achievement: 06/24/21 Potential to Achieve Goals: Good Progress towards PT goals: Progressing toward goals    Frequency    Min 3X/week      PT Plan Current plan remains appropriate    Co-evaluation              AM-PAC PT "6 Clicks" Mobility   Outcome Measure  Help needed turning from your back to your side while in a flat bed without using bedrails?: A Lot Help needed moving from lying on your back to sitting on the side of a flat bed without using bedrails?: A Lot Help needed moving to and from a bed to a chair (including a wheelchair)?: A Lot Help needed standing up from a chair using your arms (e.g., wheelchair or bedside chair)?: A Lot Help needed to walk in hospital room?: A Lot Help needed climbing 3-5 steps with a railing? : Total 6 Click Score: 11    End of Session   Activity Tolerance: Patient  tolerated treatment well;Patient limited by fatigue;Patient limited by lethargy Patient left: in chair;with call bell/phone within reach;with chair alarm set Nurse Communication: Mobility status PT Visit Diagnosis: Unsteadiness on feet (R26.81);Other abnormalities of gait and mobility (R26.89);Muscle weakness (generalized) (M62.81)     Time: 6283-1517 PT Time Calculation (min) (ACUTE ONLY): 22 min  Charges:  $Therapeutic Exercise: 8-22 mins $Therapeutic Activity: 8-22 mins                     2:42 PM, 06/11/21 Lonell Grandchild, MPT Physical Therapist with [facility] [phone] office [phone] mobile phone

## 2021-06-11 NOTE — Progress Notes (Signed)
Nsg Discharge Note  Admit Date:  06/08/2021 Discharge date: 06/11/2021   Charlotte Lane to be D/C'd Home per MD order.  AVS completed.   Patient/caregiver able to verbalize understanding.  Discharge Medication: Allergies as of 06/11/2021   No Known Allergies      Medication List     TAKE these medications    aspirin 325 MG EC tablet Take 1 tablet (325 mg total) by mouth daily with breakfast. X 30 days, then start aspirin 81 mg daily Start taking on: June 12, 2021 What changed:  medication strength how much to take when to take this additional instructions   atorvastatin 10 MG tablet Commonly known as: LIPITOR Take 10 mg by mouth daily.   cholecalciferol 25 MCG (1000 UNIT) tablet Commonly known as: VITAMIN D Take 1,000 Units by mouth daily.   cyanocobalamin 1000 MCG tablet Take 1 tablet (1,000 mcg total) by mouth daily.   docusate sodium 100 MG capsule Commonly known as: COLACE Take 1 capsule (100 mg total) by mouth 2 (two) times daily.   feeding supplement Liqd Take 237 mLs by mouth 3 (three) times daily between meals.   fluticasone 50 MCG/ACT nasal spray Commonly known as: FLONASE Place into both nostrils.   HYDROcodone-acetaminophen 5-325 MG tablet Commonly known as: NORCO/VICODIN Take 1 tablet by mouth every 4 (four) hours as needed for moderate pain.   lisinopril 10 MG tablet Commonly known as: ZESTRIL Take 10 mg by mouth daily.   metoprolol tartrate 25 MG tablet Commonly known as: LOPRESSOR Take 0.5 tablets (12.5 mg total) by mouth 2 (two) times daily.   multivitamin with minerals Tabs tablet Take 1 tablet by mouth daily. Start taking on: June 12, 2021   risperiDONE 1 MG disintegrating tablet Commonly known as: RISPERDAL M-TABS Take 1 tablet (1 mg total) by mouth 2 (two) times daily.               Durable Medical Equipment  (From admission, onward)           Start     Ordered   06/10/21 1454  For home use only DME  lightweight manual wheelchair with seat cushion  Once       Comments: Patient suffers from right femoral neck fracture which impairs their ability to perform daily activities like dressing in the home.  A walker will not resolve  issue with performing activities of daily living. A wheelchair will allow patient to safely perform daily activities. Patient is not able to propel themselves in the home using a standard weight wheelchair due to general weakness. Patient can self propel in the lightweight wheelchair. Length of need 6 months . Accessories: elevating leg rests (ELRs), wheel locks, extensions and anti-tippers.   06/10/21 1454            Discharge Assessment: Vitals:   06/11/21 0431 06/11/21 1201  BP: (!) 147/66 (!) 109/53  Pulse: 72 61  Resp: 18 16  Temp: 99 F (37.2 C) 98.9 F (37.2 C)  SpO2: 96% 96%   Skin clean, dry and intact without evidence of skin break down, no evidence of skin tears noted. IV catheter discontinued intact. Site without signs and symptoms of complications - no redness or edema noted at insertion site, patient denies c/o pain - only slight tenderness at site.  Dressing with slight pressure applied.  D/c Instructions-Education: Discharge instructions given to patient/family with verbalized understanding. D/c education completed with patient/family including follow up instructions, medication list, d/c activities limitations  if indicated, with other d/c instructions as indicated by MD - patient able to verbalize understanding, all questions fully answered. Patient instructed to return to ED, call 911, or call MD for any changes in condition.  Patient escorted via [place], and D/C home via private auto.  Kathie Rhodes, RN 06/11/2021 12:09 PM

## 2021-06-11 NOTE — Care Management Important Message (Signed)
Important Message  Patient Details  Name: Charlotte Lane MRN: [id number] Date of Birth: May 18, 1945   Medicare Important Message Given:  Yes     Tommy Medal 06/11/2021, 11:45 AM

## 2021-06-15 ENCOUNTER — Encounter (HOSPITAL_COMMUNITY): Payer: Self-pay | Admitting: Orthopedic Surgery

## 2021-06-29 ENCOUNTER — Telehealth: Payer: Self-pay | Admitting: Orthopedic Surgery

## 2021-06-29 NOTE — Telephone Encounter (Signed)
Patient is in a memory care unit. Patient started complaining of arm/shoulder pain. Patient's pcp ordered x-ray and it showed a fracture with age undetermined. A mobile xray unit took the xray so they don't have the pictures, however, they do have the report. Have asked for the report to be faxed to [place] so you can read it. Patient has a follow up appt for her hip on 07/06/21

## 2021-06-29 NOTE — Telephone Encounter (Signed)
Please call the facility and speak with Isaiah Blakes  [phone].  Left Shoulder pain.

## 2021-06-30 NOTE — Telephone Encounter (Signed)
Spoke with Juliann Pulse at the living facility. Advised her that without images, he was unable to make a recommendation. Advised her to keep the patients appt on 07/06/21 ?

## 2021-07-06 ENCOUNTER — Ambulatory Visit (INDEPENDENT_AMBULATORY_CARE_PROVIDER_SITE_OTHER): Payer: Medicare Other | Admitting: Orthopedic Surgery

## 2021-07-06 ENCOUNTER — Other Ambulatory Visit: Payer: Self-pay

## 2021-07-06 ENCOUNTER — Ambulatory Visit: Payer: Medicare Other

## 2021-07-06 DIAGNOSIS — S72001D Fracture of unspecified part of neck of right femur, subsequent encounter for closed fracture with routine healing: Secondary | ICD-10-CM

## 2021-07-06 DIAGNOSIS — S42202A Unspecified fracture of upper end of left humerus, initial encounter for closed fracture: Secondary | ICD-10-CM | POA: Diagnosis not present

## 2021-07-06 DIAGNOSIS — M25511 Pain in right shoulder: Secondary | ICD-10-CM

## 2021-07-06 NOTE — Progress Notes (Signed)
Chief Complaint  ?Patient presents with  ? Routine Post Op  ?  DOS 06/09/21 Arthroplasty Bipolar hip RIGHT  ? Shoulder Pain  ?  LEFT shoulder  ? ? ?Postop bipolar right hip ? ?The patient sustained a left proximal humerus fracture x-ray was done at the facility however the report is available but not the films it will have to be repeated she is about 3 weeks out from approximate date of injury of February 24 which was the date of the x-ray ? ?She was walking well with her hip at the time she does have dementia that is preventing her from advancing quickly ? ?She has tenderness in the proximal shoulder no bruising now mild swelling of the hand but the son says it is getting less ? ?X-ray of the proximal humerus shows a nondisplaced but comminuted fracture in the characteristic position ? ?Recommend she start passive range of motion she can weight-bear as tolerated with a platform walker on the left ? ?Continue gait training on the right ? ?Follow-up early May ?

## 2021-08-22 IMAGING — MR MR HEAD W/O CM
10 series · 48 of 48 positions shown · non-contrast
Comparison: None.

CLINICAL DATA: Altered mental status

EXAM:
MRI HEAD WITHOUT CONTRAST
TECHNIQUE: Multiplanar, multiecho pulse sequences of the brain and surrounding
structures were obtained without intravenous contrast.

[Series 2: ax dwi_tracew · axial · 3.0mm · 1.31mm/px · z∈[-97,+51]mm · 6 of 48 slices shown]
[im 1/48]
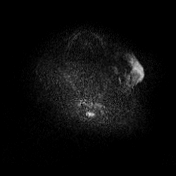
[im 10/48]
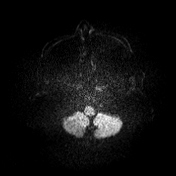
[im 19/48]
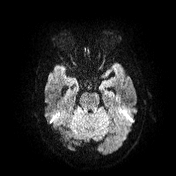
[im 29/48]
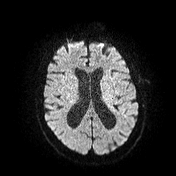
[im 38/48]
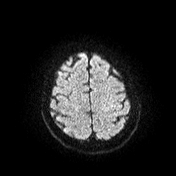
[im 48/48]
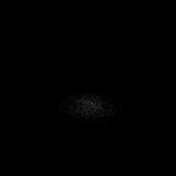

[Series 3: ax dwi_adc · axial · 3.0mm · 1.31mm/px · z∈[-97,+51]mm · 7 of 47 slices shown]
[im 1/47]
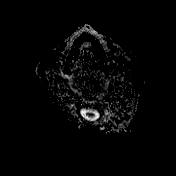
[im 8/47]
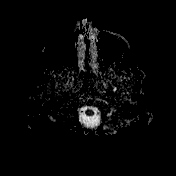
[im 16/47]
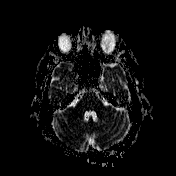
[im 24/47]
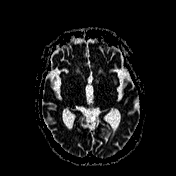
[im 31/47]
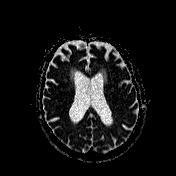
[im 39/47]
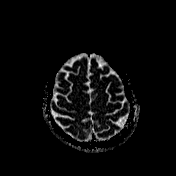
[im 47/47]
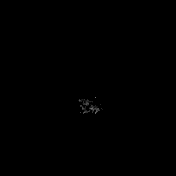

[Series 4: cor dwi_tracew · coronal · 5.0mm · 1.31mm/px · 6 of 38 slices shown]
[im 1/38]
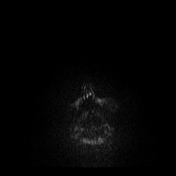
[im 8/38]
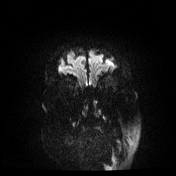
[im 15/38]
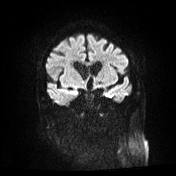
[im 23/38]
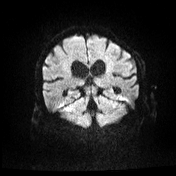
[im 30/38]
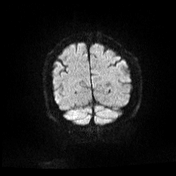
[im 38/38]
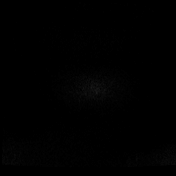

[Series 5: cor dwi_adc · coronal · 5.0mm · 1.31mm/px · 5 of 36 slices shown]
[im 1/36]
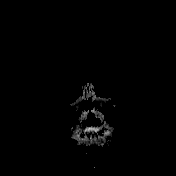
[im 9/36]
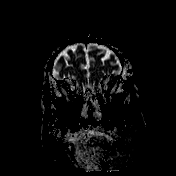
[im 18/36]
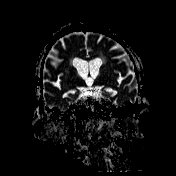
[im 27/36]
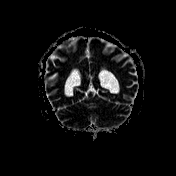
[im 36/36]
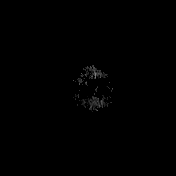

[Series 6: T1 · sagittal · 5.0mm · 0.94mm/px · 3 of 21 slices shown (1 of 2)]
[im 1/21]
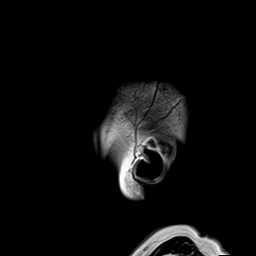
[im 11/21]
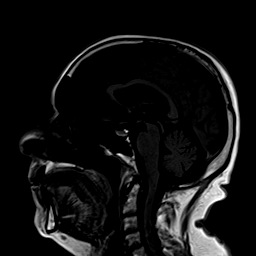
[im 21/21]
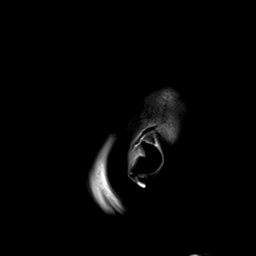

[Series 7: T2 · axial · 5.0mm · 0.45mm/px · z∈[-97,+53]mm · 4 of 27 slices shown (1 of 2)]
[im 1/27]
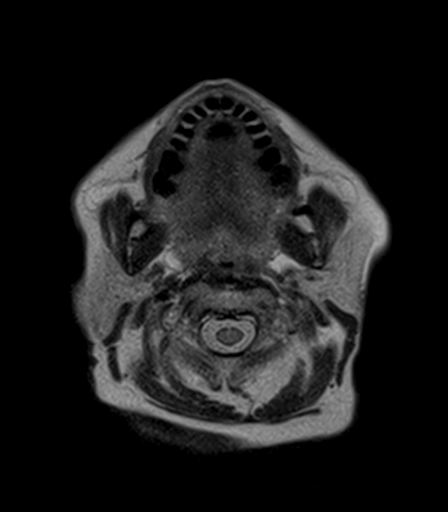
[im 9/27]
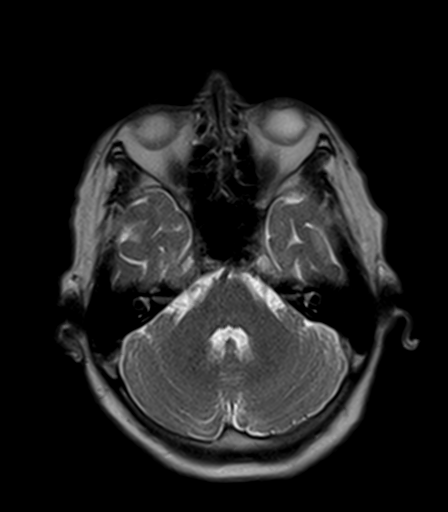
[im 18/27]
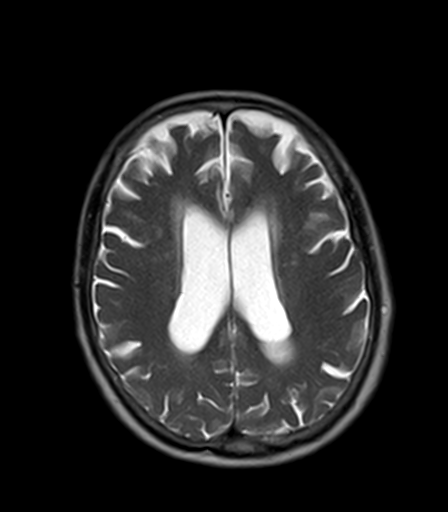
[im 27/27]
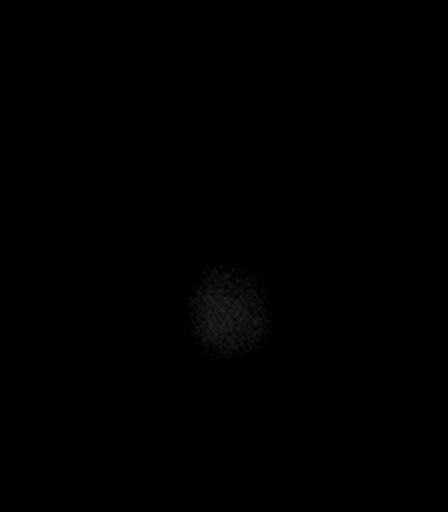

[Series 8: T2-star · axial · 5.0mm · 0.45mm/px · z∈[-97,+53]mm · 4 of 27 slices shown]
[im 1/27]
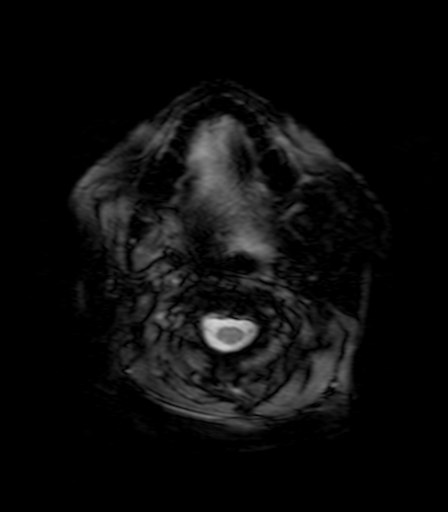
[im 9/27]
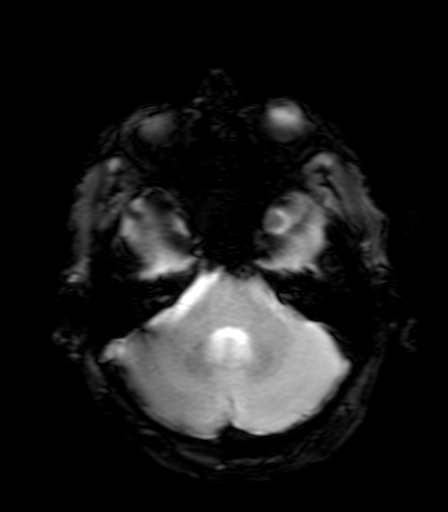
[im 18/27]
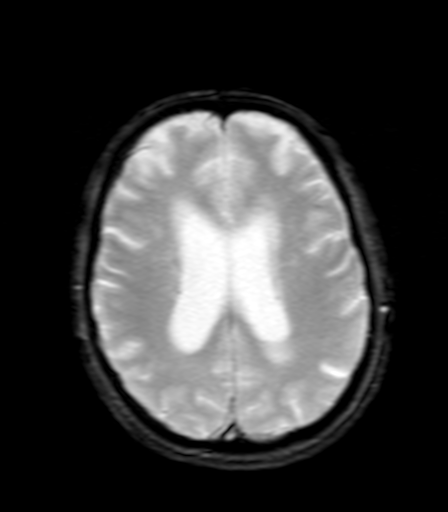
[im 27/27]
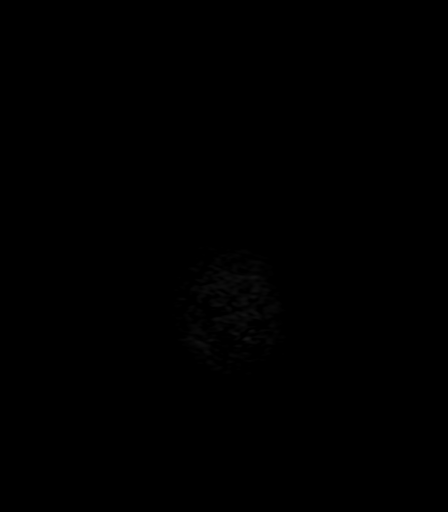

[Series 9: FLAIR · axial · 5.0mm · 1.20mm/px · z∈[-98,+51]mm · 4 of 27 slices shown]
[im 1/27]
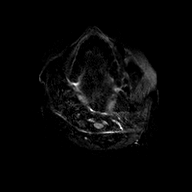
[im 9/27]
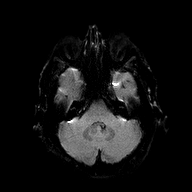
[im 18/27]
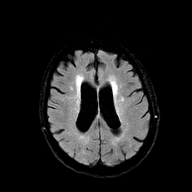
[im 27/27]
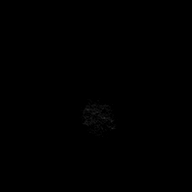

[Series 10: T1 · axial · 5.0mm · 0.90mm/px · z∈[-97,+53]mm · 4 of 27 slices shown (2 of 2)]
[im 1/27]
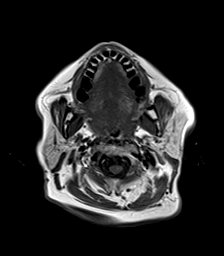
[im 9/27]
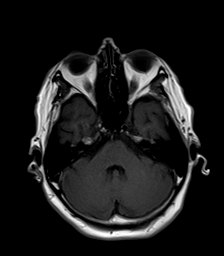
[im 18/27]
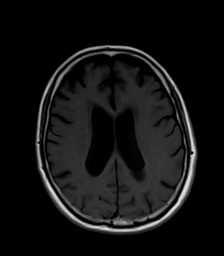
[im 27/27]
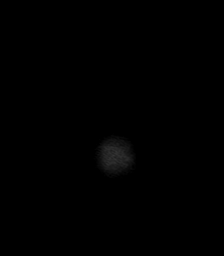

[Series 11: T2 · coronal · 5.0mm · 0.45mm/px · 5 of 31 slices shown (2 of 2)]
[im 1/31]
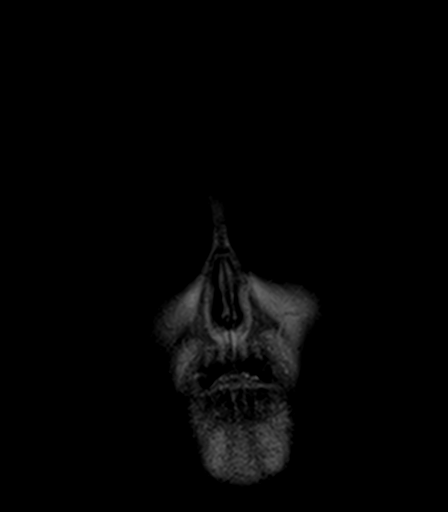
[im 8/31]
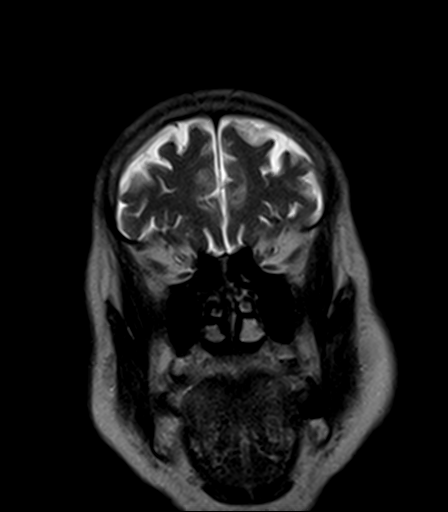
[im 16/31]
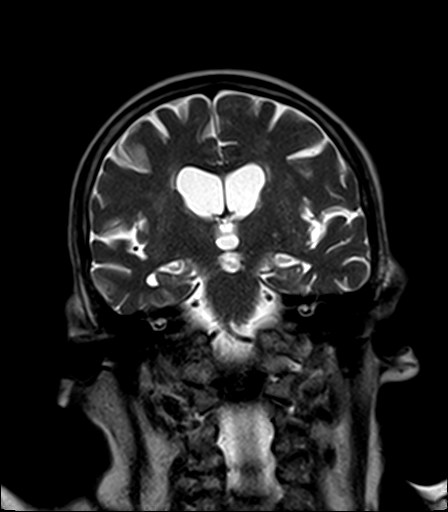
[im 23/31]
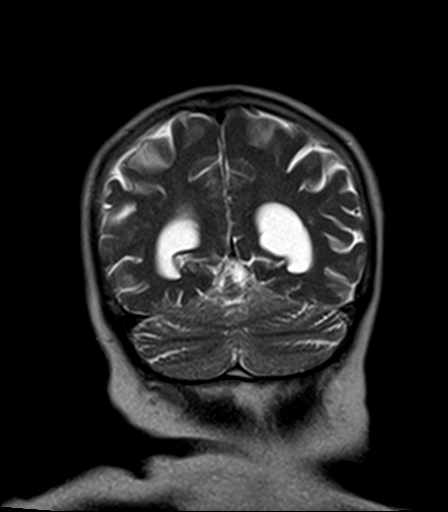
[im 31/31]
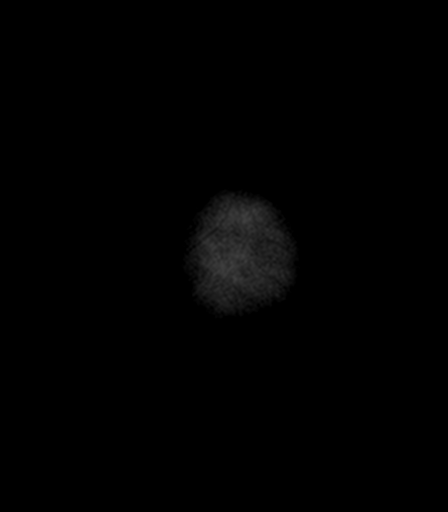

[48 of 48 positions shown; findings below may reference images not displayed]

FINDINGS: Brain: There is no acute infarction or intracranial hemorrhage.
There is no intracranial mass, mass effect, or edema. There is no
hydrocephalus or extra-axial fluid collection. Prominence of the
ventricles and sulci reflects generalized parenchymal volume loss.
Patchy T2 hyperintensity in the supratentorial white matter is
nonspecific probably reflects mild to moderate chronic microvascular
ischemic changes. Ill-defined susceptibility in the inferior left
frontal lobe is difficult to localize exactly but appears to involve
the anterior perforated substance. This likely reflects chronic
blood products.

Vascular: Major vessel flow voids at the skull base are preserved.

Skull and upper cervical spine: Normal marrow signal is preserved.

Sinuses/Orbits: Paranasal sinuses are aerated. Orbits are
unremarkable.

Other: Sella is unremarkable.  Mastoid air cells are clear.
IMPRESSION: No evidence recent infarction, hemorrhage, or mass.

Mild to moderate chronic microvascular ischemic changes. Chronic
blood products in the region of the left anterior perforated
substance.

## 2021-08-23 ENCOUNTER — Ambulatory Visit (INDEPENDENT_AMBULATORY_CARE_PROVIDER_SITE_OTHER): Payer: Medicare Other | Admitting: Orthopedic Surgery

## 2021-08-23 DIAGNOSIS — S72001A Fracture of unspecified part of neck of right femur, initial encounter for closed fracture: Secondary | ICD-10-CM

## 2021-08-23 DIAGNOSIS — M25512 Pain in left shoulder: Secondary | ICD-10-CM

## 2021-08-23 NOTE — Progress Notes (Signed)
FOLLOW UP  ? ?Encounter Diagnoses  ?Name Primary?  ? Acute pain of left shoulder Yes  ? Closed displaced fracture of right femoral neck ([place])   ? ? ? ?Chief Complaint  ?Patient presents with  ? Post-op Follow-up  ?  Right Hip DOS 06/09/21  ? ? ? ?Charlotte Lane continues to do well and by report from physical therapy she is making good progress ? ?She has passive range of motion of 0 to 85 degrees abduction left shoulder 80 degrees flexion, at 0 degrees abduction she has 35 degrees of external rotation ? ?She is moving her right hip well ? ?Recommend continue physical therapy right hip and left shoulder ? ?Follow-up in 3 months ?

## 2021-09-18 ENCOUNTER — Emergency Department (HOSPITAL_COMMUNITY): Payer: Medicare Other

## 2021-09-18 ENCOUNTER — Other Ambulatory Visit: Payer: Self-pay

## 2021-09-18 ENCOUNTER — Emergency Department (HOSPITAL_COMMUNITY)
Admission: EM | Admit: 2021-09-18 | Discharge: 2021-09-18 | Disposition: A | Discharge status: Home / Self Care | Payer: Medicare Other | Attending: Emergency Medicine | Admitting: Emergency Medicine

## 2021-09-18 ENCOUNTER — Encounter (HOSPITAL_COMMUNITY): Payer: Self-pay | Admitting: Emergency Medicine

## 2021-09-18 DIAGNOSIS — R030 Elevated blood-pressure reading, without diagnosis of hypertension: Secondary | ICD-10-CM

## 2021-09-18 DIAGNOSIS — Z7982 Long term (current) use of aspirin: Secondary | ICD-10-CM | POA: Insufficient documentation

## 2021-09-18 DIAGNOSIS — T17428A Food in trachea causing other injury, initial encounter: Secondary | ICD-10-CM | POA: Diagnosis not present

## 2021-09-18 DIAGNOSIS — I10 Essential (primary) hypertension: Secondary | ICD-10-CM | POA: Insufficient documentation

## 2021-09-18 DIAGNOSIS — Z79899 Other long term (current) drug therapy: Secondary | ICD-10-CM | POA: Insufficient documentation

## 2021-09-18 DIAGNOSIS — F0390 Unspecified dementia without behavioral disturbance: Secondary | ICD-10-CM | POA: Insufficient documentation

## 2021-09-18 DIAGNOSIS — X58XXXA Exposure to other specified factors, initial encounter: Secondary | ICD-10-CM | POA: Insufficient documentation

## 2021-09-18 DIAGNOSIS — R059 Cough, unspecified: Secondary | ICD-10-CM | POA: Diagnosis not present

## 2021-09-18 NOTE — ED Notes (Signed)
EDP at [facility] 

## 2021-09-18 NOTE — ED Notes (Signed)
Alert, NAD, calm, interactive, resting in h/w stretcher, pending results.

## 2021-09-18 NOTE — ED Provider Notes (Addendum)
[facility] EMERGENCY DEPARTMENT Provider Note   CSN: [id number] Arrival date & time: 09/18/21  1341     History  Chief Complaint  Patient presents with   Aspiration    Charlotte Lane is a 76 y.o. female history includes dementia, hypertension, hyperlipidemia.  Patient presented via EMS today for concern of possible food aspiration.  Patient was eating some chicken today for lunch when she began to cough.  This continued until EMS arrived, coughing resolved prior to ED arrival.  On my initial evaluation patient is well-appearing and in no acute distress she denies any complaints or concerns specifically denies any difficulty breathing or chest pain.  Patient reports that she feels well today.  I was able to speak with the patient's son Marlou Sa over the phone who reports the patient does have a history of dementia normally alert to self and occasionally place.  Hinton Dyer reports that he was not present when the patient was coughing.  HPI     Home Medications Prior to Admission medications   Medication Sig Start Date End Date Taking? Authorizing Provider  aspirin EC 325 MG EC tablet Take 1 tablet (325 mg total) by mouth daily with breakfast. X 30 days, then start aspirin 81 mg daily 06/12/21   Tat, Shanon Brow, MD  atorvastatin (LIPITOR) 10 MG tablet Take 10 mg by mouth daily. 06/08/21   [provider]  cholecalciferol (VITAMIN D) 25 MCG (1000 UNIT) tablet Take 1,000 Units by mouth daily.    [provider]  docusate sodium (COLACE) 100 MG capsule Take 1 capsule (100 mg total) by mouth 2 (two) times daily. 06/11/21   Orson Eva, MD  feeding supplement (ENSURE ENLIVE / ENSURE PLUS) LIQD Take 237 mLs by mouth 3 (three) times daily between meals. 06/11/21   Orson Eva, MD  fluticasone (FLONASE) 50 MCG/ACT nasal spray Place into both nostrils. 05/10/21   [provider]  HYDROcodone-acetaminophen (NORCO/VICODIN) 5-325 MG tablet Take 1 tablet by mouth every 4 (four) hours as needed  for moderate pain. 06/11/21   Orson Eva, MD  lisinopril (ZESTRIL) 10 MG tablet Take 10 mg by mouth daily. 06/08/21   [provider]  metoprolol tartrate (LOPRESSOR) 25 MG tablet Take 0.5 tablets (12.5 mg total) by mouth 2 (two) times daily. 03/16/20   Jennye Boroughs, MD  Multiple Vitamin (MULTIVITAMIN WITH MINERALS) TABS tablet Take 1 tablet by mouth daily. 06/12/21   Orson Eva, MD  risperiDONE (RISPERDAL M-TABS) 1 MG disintegrating tablet Take 1 tablet (1 mg total) by mouth 2 (two) times daily. 03/16/20   Jennye Boroughs, MD  vitamin B-12 1000 MCG tablet Take 1 tablet (1,000 mcg total) by mouth daily. 03/17/20   Jennye Boroughs, MD      Allergies    Patient has no known allergies.    Review of Systems   Review of Systems  Unable to perform ROS: Dementia   Physical Exam Updated Vital Signs BP (!) 146/85 (BP Location: Right Arm)   Pulse 96   Temp 97.7 F (36.5 C) (Oral)   Resp 18   Ht '5\' 2"'$  (1.575 m)   Wt 59 kg   SpO2 100%   BMI 23.78 kg/m  Physical Exam Constitutional:      General: She is not in acute distress.    Appearance: Normal appearance. She is well-developed. She is not ill-appearing or diaphoretic.  HENT:     Head: Normocephalic and atraumatic.  Eyes:     General: Vision grossly intact. Gaze aligned  appropriately.     Pupils: Pupils are equal, round, and reactive to light.  Neck:     Trachea: Trachea and phonation normal.  Cardiovascular:     Rate and Rhythm: Normal rate and regular rhythm.  Pulmonary:     Effort: Pulmonary effort is normal. No respiratory distress.     Breath sounds: Normal breath sounds.  Abdominal:     General: There is no distension.     Palpations: Abdomen is soft.     Tenderness: There is no abdominal tenderness. There is no guarding or rebound.  Musculoskeletal:        General: Normal range of motion.     Cervical back: Normal range of motion.  Skin:    General: Skin is warm and dry.  Neurological:     Mental Status: She is  alert.     GCS: GCS eye subscore is 4. GCS verbal subscore is 5. GCS motor subscore is 6.     Comments: Speech is clear, follows basic commands Major Cranial nerves without deficit, no facial droop Moves extremities without ataxia, coordination intact  Psychiatric:        Behavior: Behavior normal.    ED Results / Procedures / Treatments   Labs (all labs ordered are listed, but only abnormal results are displayed) Labs Reviewed - No data to display  EKG None  Radiology DG Chest 2 View  Result Date: 09/18/2021 CLINICAL DATA:  Choked on food, possible aspiration EXAM: CHEST - 2 VIEW COMPARISON:  06/08/2021 FINDINGS: Transverse diameter of heart is slightly increased. There are no signs of pulmonary edema or focal pulmonary consolidation. Linear densities in the medial left lower lung fields have not changed significantly, possibly scarring or subsegmental atelectasis. There is no pleural effusion or pneumothorax. There is deformity in the neck of the left humerus suggesting healing fracture. IMPRESSION: There are no signs of pulmonary edema or patchy infiltrates in the lung fields. Linear density in the medial left lower lung fields in the retrocardiac region may suggest scarring or subsegmental atelectasis. Electronically Signed   By: Elmer Picker M.D.   On: 09/18/2021 15:38    Procedures Procedures    Medications Ordered in ED Medications - No data to display  ED Course/ Medical Decision Making/ A&P Clinical Course as of 09/18/21 1657  Sat May 20, 876  2258 76 year old female presented for possible food aspiration while eating lunch today.  On initial evaluation patient well-appearing no acute distress vital signs stable on room air she denies any difficulty breathing or any concerns.  Cardiopulmonary exam unremarkable.  Abdomen soft nontender.  Patient moves all 4 extremities spontaneously without evidence of weakness.  No acute obvious cranial nerve deficit on exam.  Will  reach out to patient's nursing facility for additional information. [BM]  1501 [phone] - No answer [facility] [BM]  [phone] Ward,Darrell  [BM]  [place] [BM]  [id number] Darrell Ward no answer [BM]    Clinical Course User Index [BM] Gari Crown                           Medical Decision Making 76 year old female presented for possible food aspiration while eating lunch today.  On my initial evaluation patient well-appearing no acute distress, she has no complaints or concerns.  Vital signs stable on room air.  She is alert to self and place confused to time and event.  She does have  a history of dementia.  I was able to obtain supplemental information from patient's son over the phone who confirmed that this is the patient's baseline.  There is no recent illness or infections that they are concerned about.  I ordered a two-view chest x-ray, I personally reviewed and interpreted that chest x-ray I do not appreciate any obvious infiltrates, PNA or PTX.  Do not appreciate any obvious right lower lobe opacities.  Patient was seen and evaluated by Dr. Eulis Foster during this visit, plan of care is p.o. challenge of the patient in observation and anticipate discharge back to facility. ---- Patient was given crackers and applesauce by nursing staff she tolerated this well without coughing or choking.  The patient is agreeable for discharge.  I tried to contact the patient's son Marlou Sa over the phone again however there was no answer.  I contacted the patient's nurse Reuben Likes at the facility, they state understanding of plan of care and they are agreeable for the patient to return back to their facility.  Child had no additional information to provide as she was not there for the episode earlier today.  ---- At discharge patient was reevaluated she is resting comfortably and in no acute distress.  Discussed case with Dr. Eulis Foster, will discharge patient back to  facility no indication for further imaging or for antibiotics or blood work at this time.  Amount and/or Complexity of Data Reviewed Independent Historian: caregiver    Details: Transport planner at facility. Dean patient's son. Radiology: ordered and independent interpretation performed. Decision-making details documented in ED Course.  Incidentally patient noted to be slightly hypertensive during ER visit 146/85.  Patient has history of hypertension, no symptoms to suggest hypertensive urgency/emergency at this time.  This was noted on discharge paperwork taken back to facility. RN Berniece Salines made aware.   At this time there does not appear to be any evidence of an acute emergency medical condition and the patient appears stable for discharge with appropriate outpatient follow up. Diagnosis was discussed with facility RN who verbalizes understanding of care plan and is agreeable to discharge. Return precautions given. Patient encouraged to follow-up with their PCP. All questions answered.   Note: Portions of this report may have been transcribed using voice recognition software. Every effort was made to ensure accuracy; however, inadvertent computerized transcription errors may still be present.   Final Clinical Impression(s) / ED Diagnoses Final diagnoses:  Cough, unspecified type  Elevated blood pressure reading    Rx / DC Orders ED Discharge Orders     None          Gari Crown 09/18/21 1701    Daleen Bo, MD 09/18/21 2333

## 2021-09-18 NOTE — ED Notes (Signed)
Tolerating fluids and snack (pears/crackers)

## 2021-09-18 NOTE — ED Notes (Signed)
Ride here to take pt back to [facility], d/c'd and taken out in w/c by other RN

## 2021-09-18 NOTE — ED Notes (Signed)
Pt up t/ b/r with RN. Back to stretcher w/o incident. Resting comfortably. Ride to arrive at 1750.

## 2021-09-18 NOTE — ED Provider Notes (Signed)
  Face-to-face evaluation   History: She was at her nursing care facility today when she choked on some food.  She did not lose consciousness or change in color.  She presents [place] for evaluation  Physical exam: Elderly alert nontoxic patient.  She is conversant but confused.  She is tolerating her oral secretions.  No stridor.  Heart regular rate and rhythm without murmur.  Lungs clear.  Abdomen soft nontender  MDM: Evaluation for  Chief Complaint  Patient presents with   Aspiration     Episode of choking, presenting with normal vital signs and mild hypertension.  No stridor noticed on exam.  Lungs clear to auscultation.  She is not tachycardic or uncomfortable.  We will try oral fluids and fluid and reassess.  Medical screening examination/treatment/procedure(s) were conducted as a shared visit with non-physician practitioner(s) and myself.  I personally evaluated the patient during the encounter    Daleen Bo, MD 09/18/21 2333

## 2021-09-18 NOTE — ED Triage Notes (Addendum)
Patient from [facility]. Patient alert but some confusion. Per [employer] this is patient's norm, hx of dementia. Patient reportedly choked on some [place] today while eating lunch. Patient's throat clear upon their arrival. O2 sat 100%. Patient handling oral secretions. Patient denies any pain or the sensation of something in throat. Patient does have occasional cough. Per [facility] patient's son wanted her evaluated.

## 2021-09-18 NOTE — Discharge Instructions (Signed)
At this time there does not appear to be the presence of an emergent medical condition, however there is always the potential for conditions to change. Please read and follow the below instructions.  Please return to the Emergency Department immediately for any new or worsening symptoms. Please be sure to follow up with your Primary Care Provider within one week regarding your visit today; please call their office to schedule an appointment even if you are feeling better for a follow-up visit. The patient's blood pressure was slightly elevated in the ER today.  Please be sure the patient takes blood pressure medication as prescribed.  Please have blood pressure rechecked this week to ensure improvement.  Go to the nearest Emergency Department immediately if: You have fever or chills You have problems breathing after choking stops. You are confused, persistently drowsy, or have lost consciousness at any point. You have a cough or difficulty breathing You get a very bad headache. You start to feel mixed up (confused). You feel weak or numb. You feel faint. You have very bad pain in your: Chest. Belly (abdomen). You vomit more than once. You have trouble breathing. You have any new/concerning or worsening of symptoms.  Please read the additional information packets attached to your discharge summary.  Do not take your medicine if  develop an itchy rash, swelling in your mouth or lips, or difficulty breathing; call 911 and seek immediate emergency medical attention if this occurs.  You may review your lab tests and imaging results in their entirety on your MyChart account.  Please discuss all results of fully with your primary care provider and other specialist at your follow-up visit.  Note: Portions of this text may have been transcribed using voice recognition software. Every effort was made to ensure accuracy; however, inadvertent computerized transcription errors may still be  present.

## 2021-09-18 NOTE — ED Notes (Signed)
Pt ambulatory to the restroom with one person assist.

## 2021-09-18 NOTE — ED Notes (Addendum)
Tolerated PO challenge. VSS. Up to b/r and back to stretcher. Encouraged to stay in stretcher. Resting with eyes closed at this time. Report called to facility by EDPA to [facility]. Pending transport.

## 2021-11-22 ENCOUNTER — Ambulatory Visit (INDEPENDENT_AMBULATORY_CARE_PROVIDER_SITE_OTHER): Payer: Medicare Other | Admitting: Orthopedic Surgery

## 2021-11-22 DIAGNOSIS — S72001A Fracture of unspecified part of neck of right femur, initial encounter for closed fracture: Secondary | ICD-10-CM | POA: Diagnosis not present

## 2021-11-22 DIAGNOSIS — Z9889 Other specified postprocedural states: Secondary | ICD-10-CM | POA: Diagnosis not present

## 2021-11-22 NOTE — Progress Notes (Signed)
FOLLOW UP   Encounter Diagnoses  Name Primary?   Closed displaced fracture of right femoral neck ([place]) Yes   Status post hip surgery February 23 bipolar right hip      Chief Complaint  Patient presents with   sp RT hip surgery    DOS 06/09/21    76 year old female at [facility] seems to be starting to get up on her own which is good and bad she is walking with therapy she is doing well her leg lengths are good her hip motion is normal  She has been released

## 2022-05-20 ENCOUNTER — Other Ambulatory Visit: Payer: Self-pay

## 2022-05-20 ENCOUNTER — Emergency Department (HOSPITAL_COMMUNITY)
Admission: EM | Admit: 2022-05-20 | Discharge: 2022-05-20 | Disposition: A | Discharge status: Skilled Nursing Facility | Payer: 59 | Attending: Emergency Medicine | Admitting: Emergency Medicine

## 2022-05-20 ENCOUNTER — Encounter (HOSPITAL_COMMUNITY): Payer: Self-pay

## 2022-05-20 ENCOUNTER — Emergency Department (HOSPITAL_COMMUNITY): Payer: 59

## 2022-05-20 DIAGNOSIS — W01198A Fall on same level from slipping, tripping and stumbling with subsequent striking against other object, initial encounter: Secondary | ICD-10-CM | POA: Diagnosis not present

## 2022-05-20 DIAGNOSIS — Y92129 Unspecified place in nursing home as the place of occurrence of the external cause: Secondary | ICD-10-CM | POA: Diagnosis not present

## 2022-05-20 DIAGNOSIS — Z7982 Long term (current) use of aspirin: Secondary | ICD-10-CM | POA: Insufficient documentation

## 2022-05-20 DIAGNOSIS — S0990XA Unspecified injury of head, initial encounter: Secondary | ICD-10-CM

## 2022-05-20 DIAGNOSIS — Y9301 Activity, walking, marching and hiking: Secondary | ICD-10-CM | POA: Diagnosis not present

## 2022-05-20 DIAGNOSIS — W19XXXA Unspecified fall, initial encounter: Secondary | ICD-10-CM

## 2022-05-20 HISTORY — DX: Unspecified dementia, unspecified severity, without behavioral disturbance, psychotic disturbance, mood disturbance, and anxiety: F03.90

## 2022-05-20 NOTE — ED Notes (Signed)
Patient transported to CT 

## 2022-05-20 NOTE — Discharge Instructions (Signed)
You are seen in the emergency department for evaluation of injuries from a fall.  You had a CAT scan of your head that did not show any obvious traumatic findings.  We are sending you back to the facility with a list of head injury instructions.  Return to the emergency department if any worsening or concerning symptoms

## 2022-05-20 NOTE — ED Notes (Signed)
[facility] here to transport pt back to [facility], previous RN reported she had called facility for hand off report to advised pt would be returning back to SNF.

## 2022-05-20 NOTE — ED Provider Notes (Signed)
[facility] Provider Note   CSN: [id number] Arrival date & time: 05/20/22  1413     History  Chief Complaint  Patient presents with   Lytle Michaels    Charlotte Lane is a 77 y.o. female.  Level 5 caveat secondary to neurocognitive disorder.  She is brought in by ambulance for evaluation of injuries from a fall.  Patient states she was at home and fell but did not get hurt.  She tells me her son brought her here.  Nursing note tells me she is a resident at [facility] and was brought here by ambulance.  Per EMS she lost her balance hit her forehead while she was walking without her walker.  Patient tells me she does not use a walker.  She denies any injuries or complaints.  Does not appear that she is on a blood thinner.  The history is provided by the patient.  Fall Pertinent negatives include no chest pain, no abdominal pain, no headaches and no shortness of breath. Nothing aggravates the symptoms. Nothing relieves the symptoms. She has tried nothing for the symptoms. The treatment provided no relief.       Home Medications Prior to Admission medications   Medication Sig Start Date End Date Taking? Authorizing Provider  aspirin EC 325 MG EC tablet Take 1 tablet (325 mg total) by mouth daily with breakfast. X 30 days, then start aspirin 81 mg daily 06/12/21   Tat, Shanon Brow, MD  atorvastatin (LIPITOR) 10 MG tablet Take 10 mg by mouth daily. 06/08/21   [provider]  cholecalciferol (VITAMIN D) 25 MCG (1000 UNIT) tablet Take 1,000 Units by mouth daily.    [provider]  docusate sodium (COLACE) 100 MG capsule Take 1 capsule (100 mg total) by mouth 2 (two) times daily. 06/11/21   Orson Eva, MD  feeding supplement (ENSURE ENLIVE / ENSURE PLUS) LIQD Take 237 mLs by mouth 3 (three) times daily between meals. 06/11/21   Orson Eva, MD  fluticasone (FLONASE) 50 MCG/ACT nasal spray Place into both nostrils. 05/10/21   [provider]   HYDROcodone-acetaminophen (NORCO/VICODIN) 5-325 MG tablet Take 1 tablet by mouth every 4 (four) hours as needed for moderate pain. 06/11/21   Orson Eva, MD  lisinopril (ZESTRIL) 10 MG tablet Take 10 mg by mouth daily. 06/08/21   [provider]  metoprolol tartrate (LOPRESSOR) 25 MG tablet Take 0.5 tablets (12.5 mg total) by mouth 2 (two) times daily. 03/16/20   Jennye Boroughs, MD  Multiple Vitamin (MULTIVITAMIN WITH MINERALS) TABS tablet Take 1 tablet by mouth daily. Patient not taking: Reported on 11/22/2021 06/12/21   Orson Eva, MD  risperiDONE (RISPERDAL M-TABS) 1 MG disintegrating tablet Take 1 tablet (1 mg total) by mouth 2 (two) times daily. 03/16/20   Jennye Boroughs, MD  vitamin B-12 1000 MCG tablet Take 1 tablet (1,000 mcg total) by mouth daily. 03/17/20   Jennye Boroughs, MD      Allergies    Patient has no known allergies.    Review of Systems   Review of Systems  Unable to perform ROS: Dementia  Respiratory:  Negative for shortness of breath.   Cardiovascular:  Negative for chest pain.  Gastrointestinal:  Negative for abdominal pain.  Neurological:  Negative for headaches.    Physical Exam Updated Vital Signs BP (!) 196/81   Pulse 63   Temp 97.7 F (36.5 C) (Oral)   Resp 18   Ht '5\' 2"'$  (1.575 m)  Wt 59 kg   SpO2 100%   BMI 23.78 kg/m  Physical Exam Vitals and nursing note reviewed.  Constitutional:      General: She is not in acute distress.    Appearance: Normal appearance. She is well-developed.  HENT:     Head: Normocephalic and atraumatic.  Eyes:     Conjunctiva/sclera: Conjunctivae normal.  Cardiovascular:     Rate and Rhythm: Normal rate and regular rhythm.     Heart sounds: No murmur heard. Pulmonary:     Effort: Pulmonary effort is normal. No respiratory distress.     Breath sounds: Normal breath sounds.  Abdominal:     Palpations: Abdomen is soft.     Tenderness: There is no abdominal tenderness.  Musculoskeletal:        General: No  tenderness or deformity. Normal range of motion.     Cervical back: Neck supple.  Skin:    General: Skin is warm and dry.     Capillary Refill: Capillary refill takes less than 2 seconds.  Neurological:     General: No focal deficit present.     Mental Status: She is alert. She is disoriented.     Sensory: No sensory deficit.     Motor: No weakness.     ED Results / Procedures / Treatments   Labs (all labs ordered are listed, but only abnormal results are displayed) Labs Reviewed - No data to display  EKG None  Radiology CT Head Wo Contrast  Result Date: 05/20/2022 CLINICAL DATA:  Status post fall. EXAM: CT HEAD WITHOUT CONTRAST TECHNIQUE: Contiguous axial images were obtained from the base of the skull through the vertex without intravenous contrast. RADIATION DOSE REDUCTION: This exam was performed according to the departmental dose-optimization program which includes automated exposure control, adjustment of the mA and/or kV according to patient size and/or use of iterative reconstruction technique. COMPARISON:  June 08, 2021 FINDINGS: Brain: There is mild to moderate severity cerebral atrophy with widening of the extra-axial spaces and ventricular dilatation. There are areas of decreased attenuation within the white matter tracts of the supratentorial brain, consistent with microvascular disease changes. Vascular: No hyperdense vessel or unexpected calcification. Skull: Normal. Negative for fracture or focal lesion. Sinuses/Orbits: No acute finding. Other: None. IMPRESSION: 1. No acute intracranial abnormality. 2. 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 Electronically Signed   By: Virgina Norfolk M.D.   On: 05/20/2022 16:16    Procedures Procedures    Medications Ordered in ED Medications - No data to display  ED Course/ Medical Decision Making/ A&P                             Medical Decision Making Amount and/or Complexity of  Data Reviewed Radiology: ordered.   This patient complains of fall possible head injury; this involves an extensive number of treatment Options and is a complaint that carries with it a high risk of complications and morbidity. The differential includes contusion, fracture, bleed I ordered imaging studies which included CT head and I independently    visualized and interpreted imaging which showed no acute findings Previous records obtained and reviewed in epic, she has a few ED visits for fall Social determinants considered, no significant barriers Critical Interventions: None  After the interventions stated above, I reevaluated the patient and found patient to be asymptomatic no distress Admission and further testing considered, no indications for admission.  Will return  her back to her facility.  Head injury instructions provided         Final Clinical Impression(s) / ED Diagnoses Final diagnoses:  Fall, initial encounter  Injury of head, initial encounter    Rx / DC Orders ED Discharge Orders     None         Hayden Rasmussen, MD 05/21/22 [phone]

## 2022-05-20 NOTE — ED Triage Notes (Signed)
Pt bib EMS from [facility] c/o mechanical fall, walked without her walker. Pt states she lost her balance, hit right forehead, not on thinners, denies pain.  BP 170/76 HR 65 SPO2 100% RA

## 2022-05-20 NOTE — ED Notes (Signed)
Attempted to call report, no answer @ [phone].

## 2024-05-18 ENCOUNTER — Emergency Department (HOSPITAL_COMMUNITY)

## 2024-05-18 ENCOUNTER — Other Ambulatory Visit: Payer: Self-pay

## 2024-05-18 ENCOUNTER — Emergency Department (HOSPITAL_COMMUNITY)
Admission: EM | Admit: 2024-05-18 | Discharge: 2024-05-18 | Disposition: A | Discharge status: Skilled Nursing Facility | Source: Skilled Nursing Facility

## 2024-05-18 DIAGNOSIS — F0390 Unspecified dementia without behavioral disturbance: Secondary | ICD-10-CM | POA: Diagnosis not present

## 2024-05-18 DIAGNOSIS — M25551 Pain in right hip: Secondary | ICD-10-CM | POA: Insufficient documentation

## 2024-05-18 DIAGNOSIS — S0990XA Unspecified injury of head, initial encounter: Secondary | ICD-10-CM | POA: Diagnosis present

## 2024-05-18 DIAGNOSIS — S0083XA Contusion of other part of head, initial encounter: Secondary | ICD-10-CM | POA: Diagnosis not present

## 2024-05-18 DIAGNOSIS — W19XXXA Unspecified fall, initial encounter: Secondary | ICD-10-CM

## 2024-05-18 DIAGNOSIS — Z7982 Long term (current) use of aspirin: Secondary | ICD-10-CM | POA: Diagnosis not present

## 2024-05-18 DIAGNOSIS — M25552 Pain in left hip: Secondary | ICD-10-CM | POA: Insufficient documentation

## 2024-05-18 DIAGNOSIS — W1830XA Fall on same level, unspecified, initial encounter: Secondary | ICD-10-CM | POA: Insufficient documentation

## 2024-05-18 LAB — CBC WITH DIFFERENTIAL/PLATELET
Abs Immature Granulocytes: 0.05 K/uL (ref 0.00–0.07)
Basophils Absolute: 0 K/uL (ref 0.0–0.1)
Basophils Relative: 0 %
Eosinophils Absolute: 0 K/uL (ref 0.0–0.5)
Eosinophils Relative: 0 %
HCT: 36.5 % (ref 36.0–46.0)
Hemoglobin: 11.6 g/dL — ABNORMAL LOW (ref 12.0–15.0)
Immature Granulocytes: 0 %
Lymphocytes Relative: 8 %
Lymphs Abs: 1.1 K/uL (ref 0.7–4.0)
MCH: 30.5 pg (ref 26.0–34.0)
MCHC: 31.8 g/dL (ref 30.0–36.0)
MCV: 96.1 fL (ref 80.0–100.0)
Monocytes Absolute: 1 K/uL (ref 0.1–1.0)
Monocytes Relative: 7 %
Neutro Abs: 11.9 K/uL — ABNORMAL HIGH (ref 1.7–7.7)
Neutrophils Relative %: 85 %
Platelets: 248 K/uL (ref 150–400)
RBC: 3.8 MIL/uL — ABNORMAL LOW (ref 3.87–5.11)
RDW: 13.2 % (ref 11.5–15.5)
WBC: 14.2 K/uL — ABNORMAL HIGH (ref 4.0–10.5)
nRBC: 0 % (ref 0.0–0.2)

## 2024-05-18 LAB — BASIC METABOLIC PANEL WITH GFR
Anion gap: 9 (ref 5–15)
BUN: 21 mg/dL (ref 8–23)
CO2: 28 mmol/L (ref 22–32)
Calcium: 9.3 mg/dL (ref 8.9–10.3)
Chloride: 104 mmol/L (ref 98–111)
Creatinine, Ser: 0.98 mg/dL (ref 0.44–1.00)
GFR, Estimated: 59 mL/min — ABNORMAL LOW
Glucose, Bld: 156 mg/dL — ABNORMAL HIGH (ref 70–99)
Potassium: 4.1 mmol/L (ref 3.5–5.1)
Sodium: 141 mmol/L (ref 135–145)

## 2024-05-18 NOTE — ED Triage Notes (Signed)
 Pt arrived via [facility]. Facility called because she fell today and has been having right leg pain. Pt has an obvious bruise on forehead. Pt unable to recall when fall occurred and if she lost consciousness or not. Per EMS pt has a history of afib and early stages of dementia. Pt A and o x3.

## 2024-05-18 NOTE — ED Provider Notes (Signed)
 Signout from Dr. Gennaro.  78 year old female with dementia from nursing home after unwitnessed fall and right leg pain.  X-ray showing suspicious for left hip fracture.  Pending left hip CT.  If negative she can likely be discharged back to nursing home. Physical Exam  BP 100/88   Pulse 63   Temp 99.5 F (37.5 C) (Oral)   Resp (!) 26   Ht 5' 2 (1.575 m)   Wt 59 kg   SpO2 98%   BMI 23.79 kg/m   Physical Exam  Procedures  Procedures  ED Course / MDM    Medical Decision Making Amount and/or Complexity of Data Reviewed Labs: ordered. Radiology: ordered.   I consulted and reviewed her imaging with Dr. Dale Hock orthopedics.  He reviewed her CT and feels this is old.  He said she can weight-bear as tolerated.  No indication for intervention at this time.

## 2024-05-18 NOTE — ED Provider Notes (Signed)
 " [place] [facility]ERGENCY DEPARTMENT AT [facility] Provider Note   CSN: [id number] Arrival date & time: 05/18/24  1313     Patient presents with: Charlotte Lane   NATALIE MCEUEN is a 79 y.o. female.   79 year old female presents for evaluation of fall.  She is a very poor historian.  Has a history of dementia.  Reported unwitnessed fall is complaining of some leg pain.  Does have a bruise on her head.  She denies any other complaints or concerns.   Fall       Prior to Admission medications  Medication Sig Start Date End Date Taking? Authorizing Provider  aspirin  EC 325 MG EC tablet Take 1 tablet (325 mg total) by mouth daily with breakfast. X 30 days, then start aspirin  81 mg daily 06/12/21   Tat, Alm, MD  atorvastatin  (LIPITOR) 10 MG tablet Take 10 mg by mouth daily. 06/08/21   [provider]  cholecalciferol (VITAMIN D) 25 MCG (1000 UNIT) tablet Take 1,000 Units by mouth daily.    [provider]  docusate sodium  (COLACE) 100 MG capsule Take 1 capsule (100 mg total) by mouth 2 (two) times daily. 06/11/21   Evonnie Alm, MD  feeding supplement (ENSURE ENLIVE / ENSURE PLUS) LIQD Take 237 mLs by mouth 3 (three) times daily between meals. 06/11/21   Evonnie Alm, MD  fluticasone  (FLONASE ) 50 MCG/ACT nasal spray Place into both nostrils. 05/10/21   [provider]  HYDROcodone -acetaminophen  (NORCO/VICODIN) 5-325 MG tablet Take 1 tablet by mouth every 4 (four) hours as needed for moderate pain. 06/11/21   Evonnie Alm, MD  lisinopril  (ZESTRIL ) 10 MG tablet Take 10 mg by mouth daily. 06/08/21   [provider]  metoprolol  tartrate (LOPRESSOR ) 25 MG tablet Take 0.5 tablets (12.5 mg total) by mouth 2 (two) times daily. 03/16/20   Jens Durand, MD  Multiple Vitamin (MULTIVITAMIN WITH MINERALS) TABS tablet Take 1 tablet by mouth daily. Patient not taking: Reported on 11/22/2021 06/12/21   Evonnie Alm, MD  risperiDONE  (RISPERDAL  M-TABS) 1 MG disintegrating tablet Take 1  tablet (1 mg total) by mouth 2 (two) times daily. 03/16/20   Jens Durand, MD  vitamin B-12 1000 MCG tablet Take 1 tablet (1,000 mcg total) by mouth daily. 03/17/20   Jens Durand, MD    Allergies: Patient has no known allergies.    Review of Systems  Reason unable to perform ROS: dementia.    Updated Vital Signs BP (!) 157/47   Pulse 61   Temp 99.5 F (37.5 C) (Oral)   Resp (!) 25   Ht 5' 2 (1.575 m)   Wt 59 kg   SpO2 97%   BMI 23.79 kg/m   Physical Exam Vitals and nursing note reviewed.  Constitutional:      General: She is not in acute distress.    Appearance: Normal appearance. She is well-developed. She is not ill-appearing.  HENT:     Head: Normocephalic.     Comments: Bruise to forehead Eyes:     Conjunctiva/sclera: Conjunctivae normal.  Cardiovascular:     Rate and Rhythm: Normal rate and regular rhythm.     Heart sounds: No murmur heard. Pulmonary:     Effort: Pulmonary effort is normal. No respiratory distress.     Breath sounds: Normal breath sounds.  Abdominal:     Palpations: Abdomen is soft.     Tenderness: There is no abdominal tenderness.  Musculoskeletal:        General: Tenderness present.  No swelling.     Cervical back: Neck supple.     Comments: Right hip and femur tender to palpation, limited ROM of right leg due to pain, patellar region swollen and tender with some crepitus   Skin:    General: Skin is warm and dry.     Capillary Refill: Capillary refill takes less than 2 seconds.  Neurological:     Mental Status: She is alert. Mental status is at baseline.  Psychiatric:        Mood and Affect: Mood normal.     (all labs ordered are listed, but only abnormal results are displayed) Labs Reviewed  BASIC METABOLIC PANEL WITH GFR - Abnormal; Notable for the following components:      Result Value   Glucose, Bld 156 (*)    GFR, Estimated 59 (*)    All other components within normal limits  CBC WITH DIFFERENTIAL/PLATELET - Abnormal;  Notable for the following components:   WBC 14.2 (*)    RBC 3.80 (*)    Hemoglobin 11.6 (*)    Neutro Abs 11.9 (*)    All other components within normal limits    EKG: EKG Interpretation Date/Time:  Saturday May 18 2024 13:27:18 EST Ventricular Rate:  60 PR Interval:  69 QRS Duration:  90 QT Interval:  431 QTC Calculation: 431 R Axis:   3  Text Interpretation: Sinus rhythm Short PR interval RAE, consider biatrial enlargement Compared with prior EKG from 06/08/2021 Confirmed by Gennaro Bouchard ([id number]) on 05/18/2024 1:47:48 PM  Radiology: [facility] Femur Min 2 Views Right Result Date: 05/18/2024 EXAM: 2 VIEW(S) XRAY OF THE RIGHT FEMUR 05/18/2024 02:27:00 PM COMPARISON: None available. 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 Mild degenerative changes of the right knee medial compartment. SOFT TISSUES: Atherosclerotic vascular calcifications. IMPRESSION: 1. No acute findings. 2. 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 Electronically signed by: Morgane Naveau MD 05/18/2024 02:59 PM EST RP Workstation: [id number]   DG Knee Complete 4 Views Right Result Date: 05/18/2024 EXAM: 4 VIEW(S) XRAY OF THE KNEE 05/18/2024 02:27:00 PM COMPARISON: None available. CLINICAL HISTORY: fall, hip pain fall, hip pain fall, hip pain FINDINGS: BONES AND JOINTS: No acute fracture. No malalignment. No significant joint effusion. Enthesophyte of patellar tendon insertion on patella. SOFT TISSUES: Vascular calcifications. IMPRESSION: 1. No acute fracture or dislocation. Electronically signed by: Morgane Naveau MD 05/18/2024 02:59 PM EST RP Workstation: [id number]   DG Hip Unilat With Pelvis 2-3 Views Right Result Date: 05/18/2024 EXAM: 2 or 3 VIEW(S) XRAY OF THE UNILATERAL HIP 05/18/2024 02:27:00 PM COMPARISON: 06/08/2021 CLINICAL HISTORY: fall, hip pain fall, hip pain fall, hip pain FINDINGS: BONES AND JOINTS: Right hip hemiarthroplasty in place. Possible  nondisplaced impacted left femoral neck fracture. No malalignment. No acute fracture of the bones of the pelvis. SOFT TISSUES: Vascular calcifications. IMPRESSION: 1. Possible acute nondisplaced impacted left femoral neck fracture. Recommended dedicated left hip radiographs. 2. Right hip hemiarthroplasty in place. Electronically signed by: Morgane Naveau MD 05/18/2024 02:58 PM EST RP Workstation: [id number]   CT Cervical Spine Wo Contrast Result Date: 05/18/2024 EXAM: CT CERVICAL SPINE WITHOUT CONTRAST 05/18/2024 02:10:45 PM TECHNIQUE: CT of the cervical spine was performed without the administration of intravenous contrast. Multiplanar reformatted images are provided for review. Automated exposure control, iterative reconstruction, and/or weight based adjustment of the mA/kV was utilized to reduce the radiation dose to as low as reasonably achievable. COMPARISON: None available. CLINICAL HISTORY: fall, hit head, neck pain  FINDINGS: BONES AND ALIGNMENT: Straightening of the cervical lordosis is present. Minimal grade 1 degenerative anterolisthesis is present at C4-C5. No acute fracture or traumatic malalignment. DEGENERATIVE CHANGES: Facet hypertrophy and spurring contribute to moderate right foraminal stenosis at C3-C4. Uncovertebral and facet hypertrophy contribute to moderate right foraminal narrowing at C4-C5, C5-C6, and C6-C7. SOFT TISSUES: Atherosclerotic changes are present at the carotid bifurcations bilaterally. No definite stenosis is present. No prevertebral soft tissue swelling. IMPRESSION: 1. No acute fracture or traumatic malalignment. Electronically signed by: Lonni Necessary MD 05/18/2024 02:21 PM EST RP Workstation: [id number]   CT Head Wo Contrast Result Date: 05/18/2024 EXAM: CT HEAD WITHOUT CONTRAST 05/18/2024 02:10:45 PM TECHNIQUE: CT of the head was performed without the administration of intravenous contrast. Automated exposure control, iterative reconstruction, and/or weight based  adjustment of the mA/kV was utilized to reduce the radiation dose to as low as reasonably achievable. COMPARISON: Comparison is made to a prior study dated 03/21/2023. Atrophy and white matter changes demonstrate some progression, moderately advanced for age. CLINICAL HISTORY: fall, hit head fall, hit head fall, hit head fall, hit head fall, hit head FINDINGS: BRAIN AND VENTRICLES: No acute hemorrhage. No evidence of acute infarct. Cerebral volume loss with ventriculomegaly. Periventricular white matter hypoattenuation, most commonly indicating chronic ischemic microangiopathy. No extra-axial collection. No mass effect or midline shift. ORBITS: No acute abnormality. SINUSES: No acute abnormality. SOFT TISSUES AND SKULL: No acute soft tissue abnormality. No skull fracture. Atherosclerotic calcification of internal carotid arteries at skull base. IMPRESSION: 1. No acute intracranial abnormality related to the reported fall and head trauma. 2. Cerebral volume loss with ventriculomegaly and periventricular white matter hypoattenuation, consistent with chronic ischemic microangiopathy, with some progression, moderately advanced for age. Electronically signed by: Lonni Necessary MD 05/18/2024 02:16 PM EST RP Workstation: [id number]     Procedures   Medications Ordered in the ED - No data to display                                  Medical Decision Making Social terms of health: History of dementia, unable to provide history  Patient here for fall.  It was unwitnessed.  She is tender on the right but not the left.  Right sided leg imaging is negative but read shows possible left hip fracture.  CT of the hip ordered.  CT C-spine and head ordered and reviewed and unremarkable as well.  CT of the hip is pending.  Patient otherwise appears well, with stable vital signs and appears comfortable. Patient signed out to oncoming provider at 3 PM pending remainder of workup and ultimate disposition.   Problems  Addressed: Bilateral hip pain: acute illness or injury Contusion of forehead, initial encounter: acute illness or injury Fall, initial encounter: acute illness or injury  Amount and/or Complexity of Data Reviewed Independent Historian: EMS    Details: EMS at bedside helps provide history regarding what nursing home said happened.  Fall was unwitnessed External Data Reviewed: notes.    Details: Previous ER records reviewed and patient was seen a few days ago for fall with negative imaging Labs: ordered. Decision-making details documented in ED Course.    Details: Ordered and reviewed by me patient with slight leukocytosis, however this seems to be her baseline Radiology: ordered and independent interpretation performed. Decision-making details documented in ED Course.    Details: Ordered and interpreted by me independently of radiology CT head: Shows no acute abnormality CT  C-spine: Shows no acute abnormality, Hip and knee x-ray: Shows no acute fracture on the right but there may be a left hip fracture  Risk OTC drugs. Prescription drug management. Diagnosis or treatment significantly limited by social determinants of health.     Final diagnoses:  Fall, initial encounter  Bilateral hip pain  Contusion of forehead, initial encounter    ED Discharge Orders     None          Gennaro Duwaine CROME, DO 05/18/24 1504  "

## 2024-05-24 ENCOUNTER — Encounter: Payer: Self-pay | Admitting: Orthopedic Surgery

## 2024-05-24 ENCOUNTER — Ambulatory Visit: Admitting: Orthopedic Surgery

## 2024-05-24 VITALS — BP 110/80 | Ht 62.0 in | Wt 130.0 lb

## 2024-05-24 DIAGNOSIS — M25552 Pain in left hip: Secondary | ICD-10-CM | POA: Diagnosis not present

## 2024-05-24 DIAGNOSIS — M25551 Pain in right hip: Secondary | ICD-10-CM | POA: Diagnosis not present

## 2024-05-24 NOTE — Progress Notes (Signed)
 "  Office Visit Note   Patient: Charlotte Lane           Date of Birth: 22-Nov-1945           MRN: [id number] Visit Date: 05/24/2024 Requested by: Charlotte Kerney SQUIBB, MD [address] [place],  [place] [address] PCP: Charlotte Kerney SQUIBB, MD   Assessment & Plan:   Encounter Diagnoses  Name Primary?   Pain in left hip Yes   Pain in right hip     No orders of the defined types were placed in this encounter.  The ER physician consulted on this patient with another orthopedist I consulted and reviewed her imaging with Dr. Dale Lane orthopedics.  He reviewed her CT and feels this is old.  He said she can weight-bear as tolerated.  No indication for intervention at this time.  Patient will be returned back to her facility.  No family at bedside.   The patient has been ambulatory unassisted.  Based on that history and today's exam and the imaging there is no acute hip fracture patient can ambulate as tolerated  Subjective: Chief Complaint  Patient presents with   Hip Pain    right    HPI: 79 year old female fell she has got a right bipolar hip replacement there was concern over the imaging done at the hospital that there may have been an acute left hip fracture.  Dr. Hock was consulted and he thought the fracture was old the patient was return to [facility] in [place].  She is here for a follow-up evaluation  She does not complain of any pain and despite her dementia the caregiver with her today says that she walks independently without a limp              ROS: Could not assess due to the dementia   Images personally read and my interpretation :    I am reporting on x-ray #1 [facility] pelvis right hip no evidence of fracture acutely, right hip bipolar in good position, subacute old left femoral neck fracture valgus impaction  #2.  Right femur no fracture  3.  Right knee no acute fracture  #4 CT left hip subacute fracture no acute fracture    Visit Diagnoses:  1. Pain in  left hip   2. Pain in right hip      Follow-Up Instructions: Return for RELEASED.    Objective: Vital Signs: BP 110/80 Comment: 05/18/24  Ht 5' 2 (1.575 m)   Wt 130 lb (59 kg)   BMI 23.78 kg/m   Physical Exam Vitals and nursing note reviewed.  Constitutional:      Appearance: Normal appearance.  HENT:     Head: Normocephalic and atraumatic.  Eyes:     General: No scleral icterus.       Right eye: No discharge.        Left eye: No discharge.     Extraocular Movements: Extraocular movements intact.     Conjunctiva/sclera: Conjunctivae normal.     Pupils: Pupils are equal, round, and reactive to light.  Cardiovascular:     Rate and Rhythm: Normal rate.     Pulses: Normal pulses.  Skin:    General: Skin is warm and dry.     Capillary Refill: Capillary refill takes less than 2 seconds.  Neurological:     General: No focal deficit present.     Mental Status: She is alert and oriented to person, place, and time.  Psychiatric:  Mood and Affect: Mood normal.        Behavior: Behavior normal.      Right Hip Exam  Right hip exam is normal.   Range of Motion  The patient has normal right hip ROM. External rotation:  normal  Internal rotation:  normal    Left Hip Exam  Left hip exam is normal.  Range of Motion  The patient has normal left hip ROM. External rotation:  normal  Internal rotation: normal        Specialty Comments:  No specialty comments available.  Imaging: No results found.   PMFS History: Patient Active Problem List   Diagnosis Date Noted   Hypokalemia 06/10/2021   Malnutrition of moderate degree 06/10/2021   Mixed hyperlipidemia 06/09/2021   Fall    Right hip pain    Closed displaced fracture of right femoral neck (HCC) 06/08/2021   Major neurocognitive disorder (HCC) 02/27/2020   Paroxysmal atrial fibrillation with rapid ventricular response (HCC) 02/26/2020   Altered mental status 02/26/2020   Sebaceous cyst    HTN  (hypertension) 06/25/2015   Chest pain with moderate risk for cardiac etiology 06/25/2015   Chest pain 01/23/2013   Past Medical History:  Diagnosis Date   Dementia (HCC)    Hypertension     Family History  Problem Relation Age of Onset   Heart attack Father     Past Surgical History:  Procedure Laterality Date   CYST EXCISION Right 03/06/2019   Procedure: EXCISION SEBACEOUS CYST SCALP;  Surgeon: Charlotte Anes, MD;  Location: [facility];  Service: General;  Laterality: Right;   HIP ARTHROPLASTY Right 06/09/2021   Procedure: ARTHROPLASTY BIPOLAR HIP (HEMIARTHROPLASTY);  Surgeon: Charlotte Taft BRAVO, MD;  Location: [facility];  Service: Orthopedics;  Laterality: Right;  12:30   NO PAST SURGERIES     Social History   Occupational History   Not on file  Tobacco Use   Smoking status: Never   Smokeless tobacco: Never  Vaping Use   Vaping status: Never Used  Substance and Sexual Activity   Alcohol use: No   Drug use: No   Sexual activity: Not on file       "

## 2024-05-24 NOTE — Progress Notes (Signed)
" °  Intake history:  Chief Complaint  Patient presents with   Hip Pain    right     BP 110/80 Comment: 05/18/24  Ht 5' 2 (1.575 m)   Wt 130 lb (59 kg)   BMI 23.78 kg/m  Body mass index is 23.78 kg/m.  Pharmacy? _____in Facility _________________________________  WHAT ARE WE SEEING YOU FOR TODAY?   Hip right   How long has this bothered you? (DOI?DOS?WS?)  05/18/24 fell   Was there an injury? Yes  Anticoag.  No   Any ALLERGIES _______Allergies[1] _______________________________________   Treatment:  Have you taken:  Tylenol  No  Advil No  Had PT No  Had injection No  Other  _________________________        [1] No Known Allergies  "

## 2024-05-27 ENCOUNTER — Encounter: Admitting: Orthopedic Surgery
